# Patient Record
Sex: Female | Born: 1985 | Race: White | Hispanic: No | Marital: Single | Smoking: Current every day smoker
Health system: Southern US, Community
[De-identification: ages and names within clinical notes are randomized; demographics above are authoritative.]

## PROBLEM LIST (undated history)

## (undated) DIAGNOSIS — N809 Endometriosis, unspecified: Secondary | ICD-10-CM

## (undated) DIAGNOSIS — F419 Anxiety disorder, unspecified: Secondary | ICD-10-CM

## (undated) DIAGNOSIS — G935 Compression of brain: Secondary | ICD-10-CM

## (undated) DIAGNOSIS — J45909 Unspecified asthma, uncomplicated: Secondary | ICD-10-CM

## (undated) DIAGNOSIS — M24111 Other articular cartilage disorders, right shoulder: Secondary | ICD-10-CM

## (undated) DIAGNOSIS — S139XXA Sprain of joints and ligaments of unspecified parts of neck, initial encounter: Secondary | ICD-10-CM

## (undated) DIAGNOSIS — J189 Pneumonia, unspecified organism: Secondary | ICD-10-CM

## (undated) DIAGNOSIS — Z87442 Personal history of urinary calculi: Secondary | ICD-10-CM

## (undated) DIAGNOSIS — F32A Depression, unspecified: Secondary | ICD-10-CM

## (undated) DIAGNOSIS — F319 Bipolar disorder, unspecified: Secondary | ICD-10-CM

## (undated) DIAGNOSIS — R51 Headache: Secondary | ICD-10-CM

## (undated) DIAGNOSIS — S43006A Unspecified dislocation of unspecified shoulder joint, initial encounter: Secondary | ICD-10-CM

## (undated) DIAGNOSIS — F329 Major depressive disorder, single episode, unspecified: Secondary | ICD-10-CM

## (undated) DIAGNOSIS — Z8614 Personal history of Methicillin resistant Staphylococcus aureus infection: Secondary | ICD-10-CM

## (undated) DIAGNOSIS — G43711 Chronic migraine without aura, intractable, with status migrainosus: Secondary | ICD-10-CM

## (undated) DIAGNOSIS — Z8489 Family history of other specified conditions: Secondary | ICD-10-CM

## (undated) DIAGNOSIS — F4310 Post-traumatic stress disorder, unspecified: Secondary | ICD-10-CM

## (undated) DIAGNOSIS — K219 Gastro-esophageal reflux disease without esophagitis: Secondary | ICD-10-CM

## (undated) DIAGNOSIS — R519 Headache, unspecified: Secondary | ICD-10-CM

## (undated) DIAGNOSIS — F410 Panic disorder [episodic paroxysmal anxiety] without agoraphobia: Secondary | ICD-10-CM

## (undated) DIAGNOSIS — T4145XA Adverse effect of unspecified anesthetic, initial encounter: Secondary | ICD-10-CM

## (undated) DIAGNOSIS — J042 Acute laryngotracheitis: Secondary | ICD-10-CM

## (undated) DIAGNOSIS — T8859XA Other complications of anesthesia, initial encounter: Secondary | ICD-10-CM

## (undated) DIAGNOSIS — K3580 Unspecified acute appendicitis: Secondary | ICD-10-CM

## (undated) DIAGNOSIS — Z8669 Personal history of other diseases of the nervous system and sense organs: Secondary | ICD-10-CM

## (undated) DIAGNOSIS — M542 Cervicalgia: Secondary | ICD-10-CM

## (undated) HISTORY — PX: BRAIN SURGERY: SHX531

## (undated) HISTORY — DX: Personal history of other diseases of the nervous system and sense organs: Z86.69

## (undated) HISTORY — DX: Unspecified acute appendicitis: K35.80

---
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
## 2005-01-23 ENCOUNTER — Emergency Department (HOSPITAL_COMMUNITY): Admission: EM | Admit: 2005-01-23 | Discharge: 2005-01-23 | Payer: Self-pay | Admitting: Emergency Medicine

## 2005-07-29 ENCOUNTER — Ambulatory Visit (HOSPITAL_COMMUNITY): Admission: RE | Admit: 2005-07-29 | Discharge: 2005-07-29 | Payer: Self-pay | Admitting: Pediatrics

## 2006-02-19 ENCOUNTER — Emergency Department (HOSPITAL_COMMUNITY): Admission: EM | Admit: 2006-02-19 | Discharge: 2006-02-19 | Payer: Self-pay | Admitting: Emergency Medicine

## 2006-06-27 ENCOUNTER — Emergency Department (HOSPITAL_COMMUNITY): Admission: EM | Admit: 2006-06-27 | Discharge: 2006-06-27 | Payer: Self-pay | Admitting: Emergency Medicine

## 2006-07-30 ENCOUNTER — Emergency Department (HOSPITAL_COMMUNITY): Admission: EM | Admit: 2006-07-30 | Discharge: 2006-07-30 | Payer: Self-pay | Admitting: Emergency Medicine

## 2006-07-31 ENCOUNTER — Ambulatory Visit: Payer: Self-pay | Admitting: Orthopedic Surgery

## 2006-08-01 ENCOUNTER — Encounter: Payer: Self-pay | Admitting: Orthopedic Surgery

## 2006-08-04 ENCOUNTER — Emergency Department (HOSPITAL_COMMUNITY): Admission: EM | Admit: 2006-08-04 | Discharge: 2006-08-04 | Payer: Self-pay | Admitting: Emergency Medicine

## 2007-04-15 ENCOUNTER — Encounter (INDEPENDENT_AMBULATORY_CARE_PROVIDER_SITE_OTHER): Payer: Self-pay | Admitting: Obstetrics and Gynecology

## 2007-04-15 ENCOUNTER — Ambulatory Visit (HOSPITAL_COMMUNITY): Admission: RE | Admit: 2007-04-15 | Discharge: 2007-04-15 | Payer: Self-pay | Admitting: Obstetrics and Gynecology

## 2007-04-15 HISTORY — PX: DILATION AND EVACUATION: SHX1459

## 2007-09-08 ENCOUNTER — Ambulatory Visit: Payer: Self-pay | Admitting: Orthopedic Surgery

## 2007-09-08 DIAGNOSIS — S139XXA Sprain of joints and ligaments of unspecified parts of neck, initial encounter: Secondary | ICD-10-CM | POA: Insufficient documentation

## 2007-09-08 DIAGNOSIS — M542 Cervicalgia: Secondary | ICD-10-CM | POA: Insufficient documentation

## 2007-09-08 HISTORY — DX: Sprain of joints and ligaments of unspecified parts of neck, initial encounter: S13.9XXA

## 2007-09-09 ENCOUNTER — Encounter: Payer: Self-pay | Admitting: Orthopedic Surgery

## 2007-12-26 ENCOUNTER — Ambulatory Visit (HOSPITAL_COMMUNITY): Admission: RE | Admit: 2007-12-26 | Discharge: 2007-12-26 | Payer: Self-pay | Admitting: Emergency Medicine

## 2008-03-14 ENCOUNTER — Inpatient Hospital Stay (HOSPITAL_COMMUNITY): Admission: AD | Admit: 2008-03-14 | Discharge: 2008-03-14 | Payer: Self-pay | Admitting: Obstetrics and Gynecology

## 2008-08-21 ENCOUNTER — Inpatient Hospital Stay (HOSPITAL_COMMUNITY): Admission: AD | Admit: 2008-08-21 | Discharge: 2008-08-21 | Payer: Self-pay | Admitting: Obstetrics and Gynecology

## 2008-09-01 ENCOUNTER — Inpatient Hospital Stay (HOSPITAL_COMMUNITY): Admission: AD | Admit: 2008-09-01 | Discharge: 2008-09-01 | Payer: Self-pay | Admitting: Obstetrics and Gynecology

## 2008-09-05 ENCOUNTER — Inpatient Hospital Stay (HOSPITAL_COMMUNITY): Admission: RE | Admit: 2008-09-05 | Discharge: 2008-09-08 | Payer: Self-pay | Admitting: Obstetrics and Gynecology

## 2008-09-05 ENCOUNTER — Encounter (INDEPENDENT_AMBULATORY_CARE_PROVIDER_SITE_OTHER): Payer: Self-pay | Admitting: Obstetrics and Gynecology

## 2010-07-22 ENCOUNTER — Encounter: Payer: Self-pay | Admitting: Emergency Medicine

## 2010-07-23 ENCOUNTER — Encounter: Payer: Self-pay | Admitting: Emergency Medicine

## 2010-07-31 NOTE — Assessment & Plan Note (Signed)
Summary: neck pain/no [email or web address].cbt    History of Present Illness: I saw [name] in the office today for a followup visit.  She is a 25 years old woman with the complaint of:  NEW PROBLEM. Neck pain, no xrays.  She has been having non radiating pain for a month, no injury.  She has constant pain, that is sometimes worse than others.  She had a cold several weeks ago, denies fevr chills or muscle aches.  Pain now is 4. If she tilts her head down far or tilts it back far she notices alot of pain.  She has tried skelaxin, ibuprofen 800mg , valium and this does not help.  She has pain between shoulder blades.      Current Allergies: No known allergies      Review of Systems  GU      miscarriage Oct 08-needed surgery   Psych      depression treated w/o medication    Physical Exam  GEN: well developed and nourished. normal body habitus, grooming and hygiene CDV: normal pulses perfusion color and temperature without swelling or edema Lymph: normal lymph system SKIN: normal without lesions, masses, nodules NEURO/PSYCH: Normal coordination, reflexes and sensation. awake alert and oriented  MSK:C spine tenderness at C5-6, C4-5  Inspection  ROM   MOTOR 5/5  Stability: normal      Impression & Recommendations:  Problem # 1:  CERVICAL SPASM (ICD-847.0) x-rays show a normal C spine to the level of T1   Findings: normal contour and disc spaces  IMP: normal cspine    Orders: T-CBC w/Diff ([id number]) T-Sed Rate (Automated) ([id number]) T- * Misc. Laboratory test [phone]) Est. Patient Level III (84132)   Problem # 2:  CERVICALGIA (ICD-723.1)  Orders: T- * Misc. Laboratory test [phone]) Est. Patient Level III (27253)   Other Orders: Cervical x-ray 2/3 views ([id number])   Patient Instructions: 1)  Lab studies will be done to check for diskitis. I will call you with the results. 2)  Start robaxin and prednisone for neck pain.    ]

## 2010-07-31 NOTE — Progress Notes (Signed)
Summary: Office Visit  Office Visit   Imported By: Jacklynn Ganong 09/08/2007 10:29:07  _____________________________________________________________________  External Attachment:    Type:   Image     Comment:   initial evaluation

## 2010-08-13 ENCOUNTER — Emergency Department (HOSPITAL_COMMUNITY): Payer: 59

## 2010-08-13 ENCOUNTER — Emergency Department (HOSPITAL_COMMUNITY)
Admission: EM | Admit: 2010-08-13 | Discharge: 2010-08-13 | Disposition: A | Discharge status: Home / Self Care | Payer: 59 | Attending: Emergency Medicine | Admitting: Emergency Medicine

## 2010-08-13 DIAGNOSIS — R05 Cough: Secondary | ICD-10-CM | POA: Insufficient documentation

## 2010-08-13 DIAGNOSIS — J209 Acute bronchitis, unspecified: Secondary | ICD-10-CM | POA: Insufficient documentation

## 2010-08-13 DIAGNOSIS — R059 Cough, unspecified: Secondary | ICD-10-CM | POA: Insufficient documentation

## 2010-09-10 ENCOUNTER — Ambulatory Visit (HOSPITAL_COMMUNITY): Payer: 59 | Admitting: Psychiatry

## 2010-10-11 LAB — CBC
HCT: 30.3 % — ABNORMAL LOW (ref 36.0–46.0)
HCT: 37.3 % (ref 36.0–46.0)
Hemoglobin: 10.4 g/dL — ABNORMAL LOW (ref 12.0–15.0)
Hemoglobin: 12.8 g/dL (ref 12.0–15.0)
MCHC: 34.2 g/dL (ref 30.0–36.0)
MCHC: 34.3 g/dL (ref 30.0–36.0)
MCV: 92.6 fL (ref 78.0–100.0)
MCV: 92.9 fL (ref 78.0–100.0)
Platelets: 151 10*3/uL (ref 150–400)
Platelets: 187 10*3/uL (ref 150–400)
RBC: 3.27 MIL/uL — ABNORMAL LOW (ref 3.87–5.11)
RBC: 4.03 MIL/uL (ref 3.87–5.11)
RDW: 12.7 % (ref 11.5–15.5)
RDW: 13 % (ref 11.5–15.5)
WBC: 7.4 10*3/uL (ref 4.0–10.5)
WBC: 8.2 10*3/uL (ref 4.0–10.5)

## 2010-10-11 LAB — RPR: RPR Ser Ql: NONREACTIVE

## 2010-11-13 NOTE — Op Note (Signed)
[name], [name]             ACCOUNT NO.:  [id number]   MEDICAL RECORD NO.:  [id number]          PATIENT TYPE:  INP   LOCATION:  9121                          FACILITY:  [facility]   PHYSICIAN:  [name]. [name], [name] DATE OF BIRTH:  07-Nov-1985   DATE OF PROCEDURE:  09/05/2008  DATE OF DISCHARGE:                               OPERATIVE REPORT   PREOPERATIVE DIAGNOSES:  1. Intrauterine pregnancy at 39-1/2 weeks' estimated gestational age.  2. Recurrent vulvar herpes simplex.   POSTOPERATIVE DIAGNOSES:  1. Intrauterine pregnancy at 39-1/2 weeks' estimated gestational age.  2. Recurrent vulvar herpes simplex.   PROCEDURE:  Low-transverse cesarean section.   SURGEON:  [name]. [name], MD   ANESTHESIA:  Spinal, Leilani Able, MD   SPECIMENS:  Placenta to pathology.   ESTIMATED BLOOD LOSS:  800 mL.   FINDINGS:  Viable female infant, Apgars of 9 and 9 at one and five minutes  respectively.  Arterial cord pH 7.28.  Birth weight pending.   INDICATIONS AND CONSENT:  This patient is a 25 year old married female  G2, P0, EDC of September 10, 2008, consistent with early ultrasound.  Past  recurrent vulvar HSV.  Details dictated in the history and physical.  Cesarean section discussed with the patient.  Potential risks and  complications were discussed preoperatively including but not limited to  infection, organ damage, bleeding requiring transfusion of blood  products with HIV and hepatitis acquisition, DVT, PE, and pneumonia.  All questions were answered and consents on the chart.   PROCEDURE:  The patient was taken to operating room where she was  identified.  Spinal anesthetic was placed and she was placed in dorsal  supine position with a 15-degree left lateral wedge.  She was then  prepped.  Foley catheter was placed and bladder was drained.  She was  draped in a sterile fashion.  Time out was done.  After testing for  adequate spinal anesthesia, skin was entered through a  Pfannenstiel  incision and dissection was carried out in layers to the peritoneum.  Peritoneum was incised and extended superiorly and inferiorly.  Vesicouterine peritoneum was taken down cephalad laterally.  Bladder  flap was developed.  The bladder blade was placed.  Uterus was incised  in a low transverse manner.  The uterine cavity was entered bluntly with  a hemostat.  Uterine incision was extended cephalad laterally with  fingers.  Artificial rupture of membranes for clear fluid was carried  out.  The vertex was elevated in the incision with a vacuum extractor  without difficulty.  A single nuchal cord was reduced.  The baby was  then delivered and oronasal pharynx were suctioned.  Good cry and tone  was noted.  Cord was clamped and cut and the baby was handed to the  awaiting pediatrics team.  Placenta was manually delivered and sent to  pathology.  Uterine cavity was clean.  Uterus was closed in a running  locking fashion with two layers of imbricating 0 Monocryl suture.  The  bladder flap was also reapproximated.  Good hemostasis was noted.  Tubes  and ovaries were  normal bilaterally.  Anterior peritoneum was closed in  running fashion with 0 Monocryl suture, which was also used to  reapproximate the pyramidalis muscle in the midline.  Anterior rectus  fascia was closed in running fashion with the looped 0 PDS suture  starting each angle and meeting in the middle.  Skin was closed with  clips.  All sponge and instrument counts were correct and the patient  was transferred to the recovery room in stable condition.      [name] [name], [name]  Electronically Signed     JET/MEDQ  D:  09/05/2008  T:  09/06/2008  Job:  [id number]

## 2010-11-13 NOTE — H&P (Signed)
[name], [name]             ACCOUNT NO.:  [id number]   MEDICAL RECORD NO.:  [id number]          PATIENT TYPE:  AMB   LOCATION:  SDC                           FACILITY:  [facility]   PHYSICIAN:  [name]. [name], [name] DATE OF BIRTH:  Jul 18, 1985   DATE OF ADMISSION:  04/15/2007  DATE OF DISCHARGE:                              HISTORY & PHYSICAL   CHIEF COMPLAINT:  Miscarriage.   HISTORY OF PRESENT ILLNESS:  This patient is a 25 year old single white  female, G1 P0, with a last menstrual period of February 23, 2007.  On  ultrasound, she has had an intrauterine gestational sac with a yolk sac  and no fetal pole, with essentially no progression of development over  the last 13 days and 3 ultrasounds.  Quantitative HCGs have been slowly  rising.  However, with lack of development of the fetal pole, it is felt  to be consistent with a spontaneous abortion.  After discussion of  options, she is being admitted for dilatation and evacuation.  Potential  risks and complications have been reviewed preoperatively.  Blood type  is O positive.   PAST MEDICAL HISTORY:  1. History of STD.  2. History of yeast infections.   PAST SURGICAL HISTORY:  Negative.   FAMILY HISTORY:  Positive for diabetes, cancer, high blood pressure,  stroke, rheumatic fever, lung disease, varicose veins.   SOCIAL HISTORY:  Positive for social tobacco and alcohol use.  Denies  drug abuse.   MEDICATIONS:  Vitamins.   No known drug allergies.   REVIEW OF SYSTEMS:  NEUROLOGIC:  Denies headache.  CARDIAC:  No chest  pain.  PULMONARY:  Denies shortness of breath.  GI:  Denies recent  changes in bowel habits.   PHYSICAL EXAMINATION:  LUNGS:  Clear to auscultation.  HEART:  Regular rate and rhythm.  BREASTS:  Not examined.  ABDOMEN:  Nontender.  PELVIC:  Deferred.  EXTREMITIES:  Grossly within normal limits.  NEUROLOGIC:  Grossly within normal limits.   ASSESSMENT:  Spontaneous abortion.   PLAN:  Dilatation and  evacuation.      [name] [name], [name]  Electronically Signed     JET/MEDQ  D:  04/14/2007  T:  04/15/2007  Job:  [id number]

## 2010-11-13 NOTE — Discharge Summary (Signed)
NAMESACHE, SANE NO.:  [id number]   MEDICAL RECORD NO.:  [id number]          PATIENT TYPE:  MAT   LOCATION:  MATC                          FACILITY:  [facility]   PHYSICIAN:  Freddy Finner, [name]   DATE OF BIRTH:  February 28, 1986   DATE OF ADMISSION:  09/01/2008  DATE OF DISCHARGE:  09/01/2008                               DISCHARGE SUMMARY   ADMITTING DIAGNOSES:  1. Uterine pregnancy at 39-1/2 weeks estimated gestational age.  2. Recurrent vulvar herpes simplex.   DISCHARGE DIAGNOSES:  1. Status post low transverse cesarean section.  2. A viable female infant.   PROCEDURE:  Primary low transverse cesarean section.   REASON FOR ADMISSION:  Please see written H and P.   HOSPITAL COURSE:  The patient is a 25 year old white married female  gravida 2, para 0 that was admitted to [facility] at 30-  1/2 weeks estimated gestational age for cesarean section.  The patient  did have recurrent vulvar herpes simplex and decision was made to  proceed with a primary low transverse cesarean delivery.  The patient  was then transferred to the operating room where spinal anesthesia was  administered without difficulty.  A low transverse incision was made  with delivery of a viable female infant with Apgars of that at 9 at 1  minute and 9 at 5 minutes.  Arterial cord pH of 7.28.  The patient  tolerated the procedure well and was taken to the recovery room in  stable condition.   On postoperative day #1, the patient was without complaints.  Vital  signs are stable.  She is afebrile.  Abdomen soft with good return of  bowel function.  Abdominal dressing was noted to have a scant amount of  drainage noted on the bandage.   LABORATORY FINDINGS:  Hemoglobin of 10.4 platelet count of 151,000, and  WBC count of 7.4.  Blood type was noted to be O+.   On postoperative day #2, the patient was without complaint.  Vital signs  remained stable.  She is afebrile.  Abdominal  dressing had been removed  revealing incision with clean, dry, and intact.   On postoperative day #3, the patient was without complaint.  Vital signs  remained stable.  She is afebrile.  Fundus firm and nontender.  Incision  was clean, dry, and intact.  Small ecchymosis noted inferior to the  incisional site.  Staples were removed and the patient was later  discharged home.   CONDITION ON DISCHARGE:  Stable.   DIET:  Regular as tolerated.   ACTIVITY:  No heavy lifting or driving x2 weeks, no vaginal entry.   FOLLOWUP:  The patient to follow up in the office in 1-2 weeks for an  incision check.  She is to call for temperature greater than 100  degrees, persistent nausea, vomiting, heavy vaginal bleeding, and/or  redness or drainage from the incisional site.   DISCHARGE MEDICATIONS:  1. Tylox #30 one p.o. every 4-6 hours.  2. Motrin 600 mg every 6 hours.  3. Prenatal vitamins 1 p.o. daily.  4. Colace 1 p.o.  daily p.r.n.      Julio Sicks, N.P.      Freddy Finner, [name]  Electronically Signed    CC/MEDQ  D:  09/08/2008  T:  09/08/2008  Job:  [id number]

## 2010-11-13 NOTE — Op Note (Signed)
NAMEMELIANA, CANNER             ACCOUNT NO.:  [id number]   MEDICAL RECORD NO.:  [id number]          PATIENT TYPE:  AMB   LOCATION:  SDC                           [facility]ACILITY:  [facility]   PHYSICIAN:  [name]. [name], [name] DATE OF BIRTH:  07-Sep-1985   DATE OF PROCEDURE:  04/15/2007  DATE OF DISCHARGE:                               OPERATIVE REPORT   PREOPERATIVE DIAGNOSIS:  Spontaneous abortion.   POSTOPERATIVE DIAGNOSIS:  Spontaneous abortion.   PROCEDURE:  1. Dilatation and evacuation.  2. 1% Xylocaine paracervical block   SURGEON:  [name]. [name], [name]   ANESTHESIA:  MAC.   SPECIMENS:  Products of conception to pathology.   ESTIMATED BLOOD LOSS:  Minimal.   INDICATIONS AND CONSENT:  The patient is a 25 year old single white  female G1, P0, with spontaneous miscarriage.  Details are dictated in  the history and physical.  Dilatation and evacuation has been discussed  with the patient preoperatively.  The potential risks and complications  have been reviewed including but limited to infection, uterine  perforation, organ damage, bleeding requiring transfusion of blood  products, intrauterine synechia, secondary infertility, DVT, PE,  pneumonia.  All questions were answered and consent is signed on the  chart.   DESCRIPTION OF PROCEDURE:  The patient was taken to the operating room  where she is identified, placed in the dorsal supine position, and given  intravenous sedation.  She is then placed in the dorsal lithotomy  position where she is prepped, bladder straight catheterized, and draped  in a sterile fashion.  Examination revealed the uterus to be 6-7 weeks  in size.  Bivalve speculum was placed in the vagina.  The anterior  cervical lip was injected with 1% plain Xylocaine and grasped with a  single tooth tenaculum.  A paracervical block was placed at the 2, 4, 5,  7, 8, and 10 o'clock positions with approximately 20 mL total of 1%  plain Xylocaine.  The cervix is  gently and progressively dilated to a 27  dilator.  A #7 curved curette is placed in the uterine cavity and  suction curettage is carried out for obvious products of conception.  After the initial pass of the suction curette, 20 units Pitocin were  added to 1 liter of  IV fluids.  Alternating sharp and suction curettage is carried out until  the cavity feels clean.  Good hemostasis is noted.  The patient is given  intravenous Ancef.  Blood type is O+.  She is discharged home on  Methergine 0.2 mg t.i.d. for six doses.  Follow up is in the office in  two weeks.      [name] [name], [name]  Electronically Signed     JET/MEDQ  D:  04/15/2007  T:  04/15/2007  Job:  [id number]

## 2010-11-13 NOTE — H&P (Signed)
[name], [name] NO.:  [id number]   MEDICAL RECORD NO.:  [id number]           PATIENT TYPE:   LOCATION:                                 FACILITY:   PHYSICIAN:  [name]. [name], [name] DATE OF BIRTH:  [birth year]   DATE OF ADMISSION:  DATE OF DISCHARGE:                              HISTORY & PHYSICAL   CHIEF COMPLAINT:  Recurrent vulvar herpes.   HISTORY OF PRESENT ILLNESS:  This patient is a 25 year old married white  female, G2, P0 with an EDC of September 10, 2008, consistent with early  ultrasound placing her at 39-1/2 weeks' estimated gestational age.  Prenatal care has been complicated by a two-vessel cord.  Consistent  growth has been noted on ultrasounds and nonstress tests have been  reactive.  She has had depression and anxiety.  Currently on Zoloft 50  mg daily.  She has a history of culture-positive herpes as a 15-year-  old.  She has a recurrent vulvar lesion.  After discussion of the  options, she is being admitted for cesarean section.  Potential risks  and complications have been discussed preoperatively.   PAST MEDICAL HISTORY:  See prenatal history and physical.   PAST SURGICAL HISTORY:  See prenatal history and physical.   FAMILY HISTORY:  See prenatal history and physical.   OBSTETRICAL HISTORY:  See prenatal history and physical.   SOCIAL HISTORY:  See prenatal history and physical.   MEDICATIONS:  1. Prenatal vitamins.  2. Zoloft 50 mg daily.  3. Valtrex 500 mg 1 p.o. daily.   ALLERGIES:  No known drug allergies.   REVIEW OF SYSTEMS:  NEURO:  Denies headache.  CARDIAC:  Denies chest  pain.  PULMONARY:  Denies shortness of breath.   PHYSICAL EXAMINATION:  VITAL SIGNS:  Height 5 feet 6 inches and weight  220.6 pounds.  LUNGS:  Clear to auscultation.  HEART:  Regular rate and rhythm.  ABDOMEN:  Gravid and nontender.  PELVIC:  Cervix is closed and 30% effaced.  EXTREMITIES:  Grossly within normal limits.  NEUROLOGIC:  Grossly  within normal limits.   ASSESSMENT:  1. Intrauterine pregnancy at 39-1/2 weeks' estimated gestational age.  2. Recurrent vulvar herpes.   PLAN:  Cesarean section.      [name] [name], [name]  Electronically Signed     JET/MEDQ  D:  09/02/2008  T:  09/03/2008  Job:  [id number]

## 2010-11-16 NOTE — Discharge Summary (Signed)
NAMEHARLIE, BUENING NO.:  [id number]   MEDICAL RECORD NO.:  [id number]          PATIENT TYPE:  INP   LOCATION:  9121                          [facility]ACILITY:  [facility]   PHYSICIAN:  Freddy Finner, [name]   DATE OF BIRTH:  [birth year]   DATE OF ADMISSION:  09/05/2008  DATE OF DISCHARGE:  09/08/2008                               DISCHARGE SUMMARY   ADMITTING DIAGNOSES:  1. Intrauterine pregnancy at 39-1/2 weeks estimated gestational age.  2. Recurrent vulvar herpes simplex.   DISCHARGE DIAGNOSES:  1. Status post low transverse cesarean section.  2. Viable female infant.   PROCEDURE:  Low transverse cesarean section.   REASON FOR ADMISSION:  Please see dictated H&P.   HOSPITAL COURSE:  The patient is a 25 year old married female gravida 2,  para 0, that was admitted to [facility] at 39-1/2 weeks  estimated gestational age for cesarean section.  The patient did have  past recurrent vulvar HSV.  Decision was made to proceed with a primary  low transverse cesarean section.  The patient was then taken to the  operating room where spinal anesthesia was administered without  difficulty.  A low transverse incision was made with delivery of a  viable female infant with Apgars of 9 at 1 minute and 9 at 5 minutes.  Arterial cord pH of 7.28.  The patient tolerated the procedure well and  was taken to the recovery room in stable condition.  On postoperative  day #1, the patient was without complaint.  Vital signs remained stable.  She was afebrile.  Abdomen was soft.  Fundus firm and nontender.  Abdominal dressing noted to have a scant amount of drainage noted on the  bandage.   LABORATORY FINDINGS:  Hemoglobin of 10.4, platelet count of 151,000, WBC  count of 7.4, and blood type was noted to be O positive.   On postoperative day #2, the patient was without complaint.  Vital signs  remained stable.  She was afebrile.  Fundus firm and nontender.  Incision was clean,  dry, and intact.   On postoperative day #3, the patient was without complaint.  Vital signs  remained stable.  She was afebrile.  Fundus firm and nontender.  Incision was clean, dry, and intact.  Staples removed and the patient  was later discharged to home.   CONDITION ON DISCHARGE:  Stable.   DIET:  Regular as tolerated.   ACTIVITY:  No heavy lifting, no driving x2 weeks, no vaginal entry.   FOLLOWUP:  The patient is to follow up in the office in 1-2 weeks for an  incision check.  She is to call for temperature greater than 100  degrees, persistent nausea, vomiting, heavy vaginal bleeding and/or  redness or drainage from the incisional site.   DISCHARGE MEDICATIONS:  1. Tylox #30 one p.o. every 4-6 hours p.r.n.  2. Motrin 600 mg every 6 hours.  3. Prenatal vitamins 1 p.o. daily.  4. Colace 1 p.o. daily p.r.n.      Julio Sicks, N.P.      Freddy Finner, [name]  Electronically Signed  CC/MEDQ  D:  09/28/2008  T:  09/28/2008  Job:  [id number]

## 2011-03-30 ENCOUNTER — Emergency Department (HOSPITAL_COMMUNITY): Payer: 59

## 2011-03-30 ENCOUNTER — Encounter: Payer: Self-pay | Admitting: *Deleted

## 2011-03-30 ENCOUNTER — Emergency Department (HOSPITAL_COMMUNITY)
Admission: EM | Admit: 2011-03-30 | Discharge: 2011-03-30 | Disposition: A | Discharge status: Home / Self Care | Payer: 59 | Attending: Emergency Medicine | Admitting: Emergency Medicine

## 2011-03-30 DIAGNOSIS — R110 Nausea: Secondary | ICD-10-CM | POA: Insufficient documentation

## 2011-03-30 DIAGNOSIS — N760 Acute vaginitis: Secondary | ICD-10-CM | POA: Insufficient documentation

## 2011-03-30 DIAGNOSIS — F17200 Nicotine dependence, unspecified, uncomplicated: Secondary | ICD-10-CM | POA: Insufficient documentation

## 2011-03-30 DIAGNOSIS — B9689 Other specified bacterial agents as the cause of diseases classified elsewhere: Secondary | ICD-10-CM | POA: Insufficient documentation

## 2011-03-30 DIAGNOSIS — R1011 Right upper quadrant pain: Secondary | ICD-10-CM | POA: Insufficient documentation

## 2011-03-30 DIAGNOSIS — R1031 Right lower quadrant pain: Secondary | ICD-10-CM

## 2011-03-30 DIAGNOSIS — A499 Bacterial infection, unspecified: Secondary | ICD-10-CM | POA: Insufficient documentation

## 2011-03-30 LAB — DIFFERENTIAL
Basophils Absolute: 0 10*3/uL (ref 0.0–0.1)
Basophils Relative: 0 % (ref 0–1)
Eosinophils Absolute: 0.1 10*3/uL (ref 0.0–0.7)
Eosinophils Relative: 2 % (ref 0–5)
Lymphocytes Relative: 26 % (ref 12–46)
Lymphs Abs: 1.8 10*3/uL (ref 0.7–4.0)
Monocytes Absolute: 0.3 10*3/uL (ref 0.1–1.0)
Monocytes Relative: 5 % (ref 3–12)
Neutro Abs: 4.5 10*3/uL (ref 1.7–7.7)
Neutrophils Relative %: 67 % (ref 43–77)

## 2011-03-30 LAB — CBC
HCT: 39.9 % (ref 36.0–46.0)
Hemoglobin: 13.6 g/dL (ref 12.0–15.0)
MCH: 32.1 pg (ref 26.0–34.0)
MCHC: 34.1 g/dL (ref 30.0–36.0)
MCV: 94.1 fL (ref 78.0–100.0)
Platelets: 215 10*3/uL (ref 150–400)
RBC: 4.24 MIL/uL (ref 3.87–5.11)
RDW: 12.6 % (ref 11.5–15.5)
WBC: 6.8 10*3/uL (ref 4.0–10.5)

## 2011-03-30 LAB — WET PREP, GENITAL
Trich, Wet Prep: NONE SEEN
Yeast Wet Prep HPF POC: NONE SEEN

## 2011-03-30 LAB — BASIC METABOLIC PANEL
BUN: 8 mg/dL (ref 6–23)
CO2: 28 mEq/L (ref 19–32)
Calcium: 9.4 mg/dL (ref 8.4–10.5)
Chloride: 104 mEq/L (ref 96–112)
Creatinine, Ser: 0.92 mg/dL (ref 0.50–1.10)
GFR calc Af Amer: >60 mL/min (ref >60)
GFR calc non Af Amer: >60 mL/min (ref >60)
Glucose, Bld: 86 mg/dL (ref 70–99)
Potassium: 3.3 mEq/L — ABNORMAL LOW (ref 3.5–5.1)
Sodium: 139 mEq/L (ref 135–145)

## 2011-03-30 LAB — URINE MICROSCOPIC-ADD ON

## 2011-03-30 LAB — URINALYSIS, ROUTINE W REFLEX MICROSCOPIC
Bilirubin Urine: NEGATIVE
Glucose, UA: NEGATIVE mg/dL
Leukocytes, UA: NEGATIVE
Nitrite: NEGATIVE
Protein, ur: NEGATIVE mg/dL
Specific Gravity, Urine: >1.03 — ABNORMAL HIGH (ref 1.005–1.030)
Urobilinogen, UA: 0.2 mg/dL (ref 0.0–1.0)
pH: 5.5 (ref 5.0–8.0)

## 2011-03-30 LAB — PREGNANCY, URINE: Preg Test, Ur: NEGATIVE

## 2011-03-30 MED ORDER — HYDROMORPHONE HCL 1 MG/ML IJ SOLN
INTRAMUSCULAR | Status: AC
  Administered 2011-03-30: 1 mg via INTRAVENOUS
  Filled 2011-03-30: qty 1

## 2011-03-30 MED ORDER — ONDANSETRON HCL 4 MG/2ML IJ SOLN
INTRAMUSCULAR | Status: AC
  Administered 2011-03-30: 4 mg via INTRAVENOUS
  Filled 2011-03-30: qty 2

## 2011-03-30 MED ORDER — HYDROMORPHONE HCL 1 MG/ML IJ SOLN
1.0000 mg | Freq: Once | INTRAMUSCULAR | Status: AC
  Administered 2011-03-30: 1 mg via INTRAVENOUS

## 2011-03-30 MED ORDER — OXYCODONE-ACETAMINOPHEN 5-325 MG PO TABS: 1.0000 | ORAL_TABLET | ORAL | Status: AC | PRN

## 2011-03-30 MED ORDER — SODIUM CHLORIDE 0.9 % IV BOLUS (SEPSIS)
1000.0000 mL | Freq: Once | INTRAVENOUS | Status: AC
  Administered 2011-03-30: 1000 mL via INTRAVENOUS

## 2011-03-30 MED ORDER — ONDANSETRON HCL 4 MG/2ML IJ SOLN
4.0000 mg | Freq: Once | INTRAMUSCULAR | Status: AC
  Administered 2011-03-30: 4 mg via INTRAVENOUS
  Filled 2011-03-30: qty 2

## 2011-03-30 MED ORDER — METRONIDAZOLE 500 MG PO TABS: 500.0000 mg | ORAL_TABLET | Freq: Two times a day (BID) | ORAL | Status: AC

## 2011-03-30 MED ORDER — ONDANSETRON HCL 4 MG/2ML IJ SOLN
4.0000 mg | Freq: Once | INTRAMUSCULAR | Status: AC
  Administered 2011-03-30: 4 mg via INTRAVENOUS

## 2011-03-30 NOTE — ED Provider Notes (Signed)
History     CSN: [id number] Arrival date & time: 03/30/2011  3:10 PM  Chief Complaint  Patient presents with  . Abdominal Pain    HPI Comments: Pt p/w 3-4 hr h/o RLQ/side pain, it is deep pain/soreness w/ intermittent sharpness. Sharp pain is 10/10, soreness is 8/10.  Pain initially started yesterday evening but acutely worsened after eating lunch and walking around this afternoon.  Has been having nausea w/ eating for the past week. No vomiting. No diarrhea. No fevers/chills.  Hip/side/RLQ are sore to touch. Pt thought it looked swollen last night but that has resolved.   Patient is a 25 y.o. female presenting with abdominal pain. The history is provided by the patient. No language interpreter was used.  Abdominal Pain The primary symptoms of the illness include abdominal pain and nausea. The primary symptoms of the illness do not include fever, shortness of breath, vomiting, diarrhea, hematemesis, dysuria, vaginal discharge or vaginal bleeding. The current episode started 3 to 5 hours ago. The onset of the illness was sudden. The problem has not changed since onset. The abdominal pain began 3 to 5 hours ago. The pain came on suddenly. The abdominal pain has been unchanged since its onset. The abdominal pain is located in the RUQ. The abdominal pain does not radiate. The severity of the abdominal pain is 10/10. The abdominal pain is relieved by nothing. The abdominal pain is exacerbated by certain positions.  Nausea began 6 to 7 days ago. The nausea is associated with eating. The nausea is exacerbated by food.  The patient states that she believes she is currently not pregnant. The patient has not had a change in bowel habit. Symptoms associated with the illness do not include chills, anorexia, diaphoresis, heartburn, constipation, urgency, hematuria, frequency or back pain. Significant associated medical issues do not include GERD, inflammatory bowel disease, diabetes or substance abuse.     History reviewed. No pertinent past medical history.  Past Surgical History  Procedure Date  . Cesarean section     History reviewed. No pertinent family history.  History  Substance Use Topics  . Smoking status: Current Everyday Smoker -- 1.0 packs/day    Types: Cigarettes  . Smokeless tobacco: Not on file  . Alcohol Use: Yes     occasionally    OB History    Grav Para Term Preterm Abortions TAB SAB Ect Mult Living                  Review of Systems  Constitutional: Negative for fever, chills and diaphoresis.  HENT: Negative for congestion, sore throat, rhinorrhea and neck pain.   Eyes: Negative for visual disturbance.  Respiratory: Negative for cough, chest tightness and shortness of breath.   Cardiovascular: Negative for chest pain and leg swelling.  Gastrointestinal: Positive for nausea and abdominal pain. Negative for heartburn, vomiting, diarrhea, constipation, anorexia and hematemesis.  Genitourinary: Negative for dysuria, urgency, frequency, hematuria, flank pain, vaginal bleeding, vaginal discharge, difficulty urinating, vaginal pain, menstrual problem and pelvic pain.  Musculoskeletal: Negative for back pain.  Skin: Negative for rash.  Neurological: Negative for dizziness, light-headedness, numbness and headaches.  Hematological: Negative for adenopathy.    Allergies  Review of patient's allergies indicates no known allergies.  Home Medications  No current outpatient prescriptions on file.  BP 118/76  Pulse 100  Temp(Src) 98.3 F (36.8 C) (Oral)  Resp 18  Ht 5\' 6"  (1.676 m)  Wt 185 lb (83.915 kg)  BMI 29.86 kg/m2  SpO2 100%  LMP 03/24/2011  Physical Exam  Constitutional: She is oriented to person, place, and time. She appears well-developed and well-nourished. No distress.  HENT:  Head: Normocephalic and atraumatic.  Eyes: Conjunctivae and EOM are normal.  Neck: Normal range of motion. Neck supple.  Cardiovascular: Normal rate, regular  rhythm and normal heart sounds.   No murmur heard. Pulmonary/Chest: Effort normal and breath sounds normal. No respiratory distress. She has no wheezes.  Abdominal: Soft. Bowel sounds are normal. She exhibits no distension. There is tenderness in the right lower quadrant. There is no rebound and no CVA tenderness.  Genitourinary: Vagina normal. There is no rash on the right labia. There is no rash on the left labia. Uterus is not tender. Cervix exhibits no motion tenderness, no discharge and no friability. Right adnexum displays no mass, no tenderness and no fullness. Left adnexum displays no mass, no tenderness and no fullness. No tenderness or bleeding around the vagina. No vaginal discharge found.       Ectopy at cervix, no obvious carcinoma.   Musculoskeletal: Normal range of motion. She exhibits no edema.  Lymphadenopathy:    She has no cervical adenopathy.  Neurological: She is alert and oriented to person, place, and time.  Skin: Skin is warm and dry. No rash noted.    ED Course  Procedures (including critical care time)  Labs Reviewed - No data to display No results found.   No diagnosis found.    MDM  Ddx includes: appendicitis vs ectopic pregnancy vs ovarian cyst vs ovarian torsion vs MSK vs ureteral stone  Pelvic exam unremarkable, pt signicifantly less tender s/p dilaudid and zofran. Pt does have area of ectopy at cervical os, encouraged pt to get pap smear since it has been >3-4 years.   CT abd/pelvis showing non-obstructive nephrolithiasis on LEFT.  Pt does have BV on wet prep.  Could have PID component vs ovarian cyst vs other.  No worrisome findings on CT scan. Will treat BV and f/u GC/Chlam cultures. If positive will treat STI and treat pt for PID.       Harvel Quale Derryl Uher Resident 03/30/11 [phone]

## 2011-03-30 NOTE — ED Notes (Signed)
Pt a/ox4. Resp even and unlabored. NAD at this time. D/C instructions and Rx x 2  reviewed with pt. Pt verbalized understanding. Pt ambulated with steady gate to POV. Mother with pt to transport home.

## 2011-03-30 NOTE — ED Notes (Signed)
Pt c/o pain in her RLQ abdomen since yesterday. Pt also c/o nausea x 1 week. Pt denies vomiting or diarrhea.

## 2011-03-31 NOTE — ED Provider Notes (Signed)
Medical screening examination/treatment/procedure(s) were performed by resident and as supervising physician I was immediately available for consultation/collaboration.  Nelia Shi, MD 03/31/11 [phone]

## 2011-04-01 LAB — GC/CHLAMYDIA PROBE AMP, GENITAL
Chlamydia, DNA Probe: NEGATIVE
GC Probe Amp, Genital: NEGATIVE

## 2011-04-03 LAB — URINE MICROSCOPIC-ADD ON: RBC / HPF: NONE SEEN

## 2011-04-03 LAB — URINE CULTURE: Colony Count: 30000

## 2011-04-03 LAB — URINALYSIS, ROUTINE W REFLEX MICROSCOPIC
Bilirubin Urine: NEGATIVE
Glucose, UA: NEGATIVE
Hgb urine dipstick: NEGATIVE
Ketones, ur: NEGATIVE
Nitrite: NEGATIVE
Protein, ur: NEGATIVE
Specific Gravity, Urine: 1.025
Urobilinogen, UA: 0.2
pH: 5.5

## 2011-04-03 LAB — WET PREP, GENITAL
Clue Cells Wet Prep HPF POC: NONE SEEN
Trich, Wet Prep: NONE SEEN
Yeast Wet Prep HPF POC: NONE SEEN

## 2011-04-10 LAB — CBC
HCT: 37.7
Hemoglobin: 13.1
MCHC: 34.8
MCV: 93.5
Platelets: 224
RBC: 4.03
RDW: 12.7
WBC: 5.5

## 2011-10-04 ENCOUNTER — Inpatient Hospital Stay (HOSPITAL_COMMUNITY)
Admission: AD | Admit: 2011-10-04 | Discharge: 2011-10-04 | Disposition: A | Discharge status: Home / Self Care | Payer: BC Managed Care – PPO | Source: Ambulatory Visit | Attending: Obstetrics and Gynecology | Admitting: Obstetrics and Gynecology

## 2011-10-04 ENCOUNTER — Encounter (HOSPITAL_COMMUNITY): Payer: Self-pay

## 2011-10-04 ENCOUNTER — Inpatient Hospital Stay (HOSPITAL_COMMUNITY): Payer: BC Managed Care – PPO

## 2011-10-04 DIAGNOSIS — B9689 Other specified bacterial agents as the cause of diseases classified elsewhere: Secondary | ICD-10-CM | POA: Insufficient documentation

## 2011-10-04 DIAGNOSIS — A499 Bacterial infection, unspecified: Secondary | ICD-10-CM | POA: Insufficient documentation

## 2011-10-04 DIAGNOSIS — N760 Acute vaginitis: Secondary | ICD-10-CM | POA: Insufficient documentation

## 2011-10-04 DIAGNOSIS — N83209 Unspecified ovarian cyst, unspecified side: Secondary | ICD-10-CM | POA: Insufficient documentation

## 2011-10-04 DIAGNOSIS — N739 Female pelvic inflammatory disease, unspecified: Secondary | ICD-10-CM | POA: Insufficient documentation

## 2011-10-04 DIAGNOSIS — R1032 Left lower quadrant pain: Secondary | ICD-10-CM | POA: Insufficient documentation

## 2011-10-04 HISTORY — DX: Major depressive disorder, single episode, unspecified: F32.9

## 2011-10-04 HISTORY — DX: Depression, unspecified: F32.A

## 2011-10-04 HISTORY — DX: Anxiety disorder, unspecified: F41.9

## 2011-10-04 LAB — URINALYSIS, ROUTINE W REFLEX MICROSCOPIC
Bilirubin Urine: NEGATIVE
Glucose, UA: NEGATIVE mg/dL
Hgb urine dipstick: NEGATIVE
Ketones, ur: NEGATIVE mg/dL
Leukocytes, UA: NEGATIVE
Nitrite: NEGATIVE
Protein, ur: NEGATIVE mg/dL
Specific Gravity, Urine: 1.01 (ref 1.005–1.030)
Urobilinogen, UA: 0.2 mg/dL (ref 0.0–1.0)
pH: 7.5 (ref 5.0–8.0)

## 2011-10-04 LAB — WET PREP, GENITAL
Trich, Wet Prep: NONE SEEN
Yeast Wet Prep HPF POC: NONE SEEN

## 2011-10-04 LAB — POCT PREGNANCY, URINE: Preg Test, Ur: NEGATIVE

## 2011-10-04 MED ORDER — METRONIDAZOLE 500 MG PO TABS: 500.0000 mg | ORAL_TABLET | Freq: Two times a day (BID) | ORAL | Status: AC

## 2011-10-04 MED ORDER — CEFTRIAXONE SODIUM 250 MG IJ SOLR
250.0000 mg | INTRAMUSCULAR | Status: AC
  Administered 2011-10-04: 250 mg via INTRAMUSCULAR
  Filled 2011-10-04: qty 250

## 2011-10-04 MED ORDER — KETOROLAC TROMETHAMINE 30 MG/ML IJ SOLN
30.0000 mg | INTRAMUSCULAR | Status: AC
  Administered 2011-10-04: 30 mg via INTRAVENOUS
  Filled 2011-10-04: qty 1

## 2011-10-04 MED ORDER — DOXYCYCLINE HYCLATE 100 MG PO TABS: 100.0000 mg | ORAL_TABLET | Freq: Two times a day (BID) | ORAL | Status: AC

## 2011-10-04 NOTE — MAU Provider Note (Signed)
History     CSN: [id number]  Arrival date and time: 10/04/11 2725   First Provider Initiated Contact with Patient 10/04/11 [phone]     25 [id number]  Chief Complaint  Patient presents with  . Ovarian Cyst   HPI Pt presents to MAU with LLQ pain x3 days described as sharp, constant and 7/10 on pain scale.  She reports similar pain on her right side a few months ago which was evaluated at [facility] with a diagnosis per the pt of kidney stones, ovarian cysts, and BV. She denies vaginal bleeding/itching/burning/disharge, urinary symptoms, n/v, fever/chills, h/a, or dizziness.  OB History    Grav Para Term Preterm Abortions TAB SAB Ect Mult Living   2 1 1  1  1   1       Past Medical History  Diagnosis Date  . Hx MRSA infection   . Anxiety   . Depression     Past Surgical History  Procedure Date  . Cesarean section     2 vessel cord  . Dilation and curettage of uterus     Family History  Problem Relation Age of Onset  . Anesthesia problems Neg Hx     History  Substance Use Topics  . Smoking status: Current Everyday Smoker -- 1.0 packs/day    Types: Cigarettes  . Smokeless tobacco: Never Used  . Alcohol Use: Yes     occasionally    Allergies: No Known Allergies  Prescriptions prior to admission  Medication Sig Dispense Refill  . citalopram (CELEXA) 20 MG tablet Take 20 mg by mouth daily.      . diclofenac (VOLTAREN) 50 MG EC tablet Take 50 mg by mouth 2 (two) times daily as needed. For pain        Review of Systems  Constitutional: Negative for fever, chills and malaise/fatigue.  Eyes: Negative for blurred vision.  Respiratory: Negative for cough and shortness of breath.   Cardiovascular: Negative for chest pain.  Gastrointestinal: Positive for abdominal pain. Negative for heartburn, nausea and vomiting.  Genitourinary: Negative for dysuria, urgency and frequency.  Musculoskeletal: Negative.   Neurological: Negative for dizziness and headaches.    Psychiatric/Behavioral: Negative for depression.   Physical Exam   Blood pressure 116/73, pulse 80, temperature 97.2 F (36.2 C), temperature source Oral, resp. rate 16, height 5\' 6"  (1.676 m), weight 85.911 kg (189 lb 6.4 oz), last menstrual period 09/20/2011, SpO2 99.00%.  Physical Exam  Nursing note and vitals reviewed. Constitutional: She is oriented to person, place, and time. She appears well-developed and well-nourished.  Neck: Normal range of motion.  Cardiovascular: Normal rate.   Respiratory: Effort normal.  GI: Soft.  Musculoskeletal: Normal range of motion.  Neurological: She is alert and oriented to person, place, and time.  Skin: Skin is warm and dry.  Psychiatric: She has a normal mood and affect. Her behavior is normal. Judgment and thought content normal.   Pelvic exam: Cervix pink with visible ectropion, scant white discharge, vagina and external genitalia normal Bimanual exam:  Cervix 0/long/high, positive CMT, uterus nonpregnant size, anteflexed, adnexa with mild tenderness on left, no enlargement or mass palpable  MAU Course  Procedures U/A, Pelvic exam with wet prep/cultures, Pelvic U/S  Results for orders placed during the hospital encounter of 10/04/11 (from the past 24 hour(s))  URINALYSIS, ROUTINE W REFLEX MICROSCOPIC     Status: Normal   Collection Time   10/04/11  9:35 AM      Component Value Range  Color, Urine YELLOW  YELLOW    APPearance CLEAR  CLEAR    Specific Gravity, Urine 1.010  1.005 - 1.030    pH 7.5  5.0 - 8.0    Glucose, UA NEGATIVE  NEGATIVE (mg/dL)   Hgb urine dipstick NEGATIVE  NEGATIVE    Bilirubin Urine NEGATIVE  NEGATIVE    Ketones, ur NEGATIVE  NEGATIVE (mg/dL)   Protein, ur NEGATIVE  NEGATIVE (mg/dL)   Urobilinogen, UA 0.2  0.0 - 1.0 (mg/dL)   Nitrite NEGATIVE  NEGATIVE    Leukocytes, UA NEGATIVE  NEGATIVE   POCT PREGNANCY, URINE     Status: Normal   Collection Time   10/04/11  9:55 AM      Component Value Range   Preg  Test, Ur NEGATIVE  NEGATIVE   WET PREP, GENITAL     Status: Abnormal   Collection Time   10/04/11 10:10 AM      Component Value Range   Yeast Wet Prep HPF POC NONE SEEN  NONE SEEN    Trich, Wet Prep NONE SEEN  NONE SEEN    Clue Cells Wet Prep HPF POC MODERATE (*) NONE SEEN    WBC, Wet Prep HPF POC MODERATE (*) NONE SEEN    Imaging: US Transvaginal Non-ob  10/04/2011  *RADIOLOGY REPORT*  Clinical Data: Left lower quadrant pain.  LMP 09/20/2011  TRANSABDOMINAL AND TRANSVAGINAL ULTRASOUND OF PELVIS Technique:  Both transabdominal and transvaginal ultrasound examinations of the pelvis were performed. Transabdominal technique was performed for global imaging of the pelvis including uterus, ovaries, adnexal regions, and pelvic cul-de-sac.  Comparison: 11/2007   It was necessary to proceed with endovaginal exam following the transabdominal exam to visualize the endometrium, myometrium and adnexa.  Findings:  Uterus: Demonstrates a sagittal length of 8.3 cm, depth of 3.7 cm and width of 5.0 cm.  A homogeneous myometrium is seen.  Endometrium: Appears trilayered with an AP width of 8.4 mm. Findings would correlate with a late proliferative phase endometrium and correspond with the provided LMP of 09/20/2011.  No areas of focal thickening or heterogeneity are seen  Right ovary:  Has a normal appearance measuring 3.2 x 2.3 x 2.2 cm  Left ovary: Has a normal appearance measuring 3.1 x 2.5 x 3.3 cm.  Other findings: No pelvic fluid or separate adnexal masses are seen.  IMPRESSION: Normal proliferative phase pelvic ultrasound.  Original Report Authenticated By: Bertha Stakes, [name]   US Pelvis Complete  10/04/2011  *RADIOLOGY REPORT*  Clinical Data: Left lower quadrant pain.  LMP 09/20/2011  TRANSABDOMINAL AND TRANSVAGINAL ULTRASOUND OF PELVIS Technique:  Both transabdominal and transvaginal ultrasound examinations of the pelvis were performed. Transabdominal technique was performed for global imaging of the pelvis  including uterus, ovaries, adnexal regions, and pelvic cul-de-sac.  Comparison: 11/2007   It was necessary to proceed with endovaginal exam following the transabdominal exam to visualize the endometrium, myometrium and adnexa.  Findings:  Uterus: Demonstrates a sagittal length of 8.3 cm, depth of 3.7 cm and width of 5.0 cm.  A homogeneous myometrium is seen.  Endometrium: Appears trilayered with an AP width of 8.4 mm. Findings would correlate with a late proliferative phase endometrium and correspond with the provided LMP of 09/20/2011.  No areas of focal thickening or heterogeneity are seen  Right ovary:  Has a normal appearance measuring 3.2 x 2.3 x 2.2 cm  Left ovary: Has a normal appearance measuring 3.1 x 2.5 x 3.3 cm.  Other findings: No pelvic fluid or  separate adnexal masses are seen.  IMPRESSION: Normal proliferative phase pelvic ultrasound.  Original Report Authenticated By: Bertha Stakes, [name]    Assessment and Plan  A: PID BV  P: Toradol 30 mg IM x1 dose in MAU--Pt reports significant relief Rocephin 250 mg IM x1 dose in MAU Prescription for Doxycycline 100 mg BID x14 days Flagyl BID x7 days Continue to take NSAID prescribed recently by primary care provider Take medications with food and drink plenty of water  D/C home Pt has appointment with Dr [name] for Gyn visit on Monday F/U with Dr [name] and return to MAU as needed  LEFTWICH-KIRBY, Quran Vasco 10/04/2011, 10:21 AM

## 2011-10-04 NOTE — Discharge Instructions (Signed)
Pelvic Inflammatory Disease  Pelvic Inflammatory Disease (PID) is an infection in some or all of your female organs. This includes the womb (uterus), ovaries, fallopian tubes and tissues in the pelvis. PID is a common cause of sudden onset (acute) lower abdominal (pelvic) pain. PID can be treated, but it is a serious infection. It may take weeks before you are completely well. In some cases, hospitalization is needed for surgery or to administer medications to kill germs (antibiotics) through your veins (intravenously).  CAUSES    It may be caused by germs that are spread during sexual contact.   PID can also occur following:   The birth of a baby.   A miscarriage.   An abortion.   Major surgery of the pelvis.   Use of an IUD.   Sexual assault.  SYMPTOMS    Abdominal or pelvic pain.   Fever.   Chills.   Abnormal vaginal discharge.  DIAGNOSIS   Your caregiver will choose some of these methods to make a diagnosis:   A physical exam and history.   Blood tests.   Cultures of the vagina and cervix.   X-rays or ultrasound.   A procedure to look inside the pelvis (laparoscopy).  TREATMENT    Use of antibiotics by mouth or intravenously.   Treatment of sexual partners when the infection is an sexually transmitted disease (STD).   Hospitalization and surgery may be needed.  RISKS AND COMPLICATIONS    PID can cause women to become unable to have children (sterile) if left untreated or if partially treated. That is why it is important to finish all medications given to you.   Sterility or future tubal (ectopic) pregnancies can occur in fully treated individuals. This is why it is so important to follow your prescribed treatment.   It can cause longstanding (chronic) pelvic pain after frequent infections.   Painful intercourse.   Pelvic abscesses.   In rare cases, surgery or a hysterectomy may be needed.   If this is a sexually transmitted infection (STI), you are also at risk for any other STD  including AIDSor human papillomavirus (HPV).  HOME CARE INSTRUCTIONS    Finish all medication as prescribed. Incomplete treatment will put you at risk for sterility and tubal pregnancy.   Only take over-the-counter or prescription medicines for pain, discomfort, or fever as directed by your caregiver.   Do not have sex until treatment is completed or as directed by your caregiver. If PID is confirmed, your recent sexual contacts will need treatment.   Keep your follow-up appointments.  SEEK MEDICAL CARE IF:    You have increased or abnormal vaginal discharge.   You need prescription medication for your pain.   Your partner has an STD.   You are vomiting.   You cannot take your medications.  SEEK IMMEDIATE MEDICAL CARE IF:    You have a fever.   You develop increased abdominal or pelvic pain.   You develop chills.   You have pain when you urinate.   You are not better after 72 hours following treatment.  Document Released: 06/17/2005 Document Revised: 06/06/2011 Document Reviewed: 02/28/2007  [employer] Patient Information 2012 [employer].

## 2011-10-04 NOTE — MAU Note (Signed)
Patient c/o lower left side pain, has had a negative pregnancy test this am.

## 2011-10-04 NOTE — MAU Note (Signed)
Pt states hx of ovarian cysts, having left lower abd pain x3 days, rates 7/10. Denies bleeding or abnormal vaginal d/c changes. LMP-09/20/2011.

## 2011-10-05 LAB — GC/CHLAMYDIA PROBE AMP, GENITAL
Chlamydia, DNA Probe: NEGATIVE
GC Probe Amp, Genital: NEGATIVE

## 2012-02-11 ENCOUNTER — Ambulatory Visit: Admit: 2012-02-11 | Payer: Self-pay | Admitting: Obstetrics and Gynecology

## 2012-02-11 SURGERY — LAPAROSCOPY OPERATIVE
Anesthesia: General

## 2012-02-21 ENCOUNTER — Encounter: Payer: Self-pay | Admitting: Family Medicine

## 2012-02-21 ENCOUNTER — Ambulatory Visit: Payer: 59 | Admitting: Family Medicine

## 2012-03-18 ENCOUNTER — Encounter (HOSPITAL_COMMUNITY): Payer: Self-pay | Admitting: Pharmacist

## 2012-03-23 ENCOUNTER — Encounter (HOSPITAL_COMMUNITY)
Admission: RE | Admit: 2012-03-23 | Discharge: 2012-03-23 | Disposition: A | Discharge status: Home / Self Care | Payer: BC Managed Care – PPO | Source: Ambulatory Visit | Attending: Obstetrics and Gynecology | Admitting: Obstetrics and Gynecology

## 2012-03-23 ENCOUNTER — Encounter (HOSPITAL_COMMUNITY): Payer: Self-pay

## 2012-03-23 LAB — CBC
HCT: 39.5 % (ref 36.0–46.0)
Hemoglobin: 13.4 g/dL (ref 12.0–15.0)
MCH: 31.3 pg (ref 26.0–34.0)
MCHC: 33.9 g/dL (ref 30.0–36.0)
MCV: 92.3 fL (ref 78.0–100.0)
Platelets: 180 10*3/uL (ref 150–400)
RBC: 4.28 MIL/uL (ref 3.87–5.11)
RDW: 12.7 % (ref 11.5–15.5)
WBC: 7.2 10*3/uL (ref 4.0–10.5)

## 2012-03-23 LAB — SURGICAL PCR SCREEN
MRSA, PCR: NEGATIVE
Staphylococcus aureus: NEGATIVE

## 2012-03-23 NOTE — Patient Instructions (Signed)
Your procedure is scheduled on:03/31/12  Enter through the Main Entrance at :6am Pick up desk phone and dial [phone] and inform us of your arrival.  Please call [phone] if you have any problems the morning of surgery.  Remember: Do not eat after midnight:Monday Do not drink after:midnight Monday  Take these meds the morning of surgery with a sip of water:none  DO NOT wear jewelry, eye make-up, lipstick,body lotion, or dark fingernail polish. Do not shave for 48 hours prior to surgery.  If you are to be admitted after surgery, leave suitcase in car until your room has been assigned. Patients discharged on the day of surgery will not be allowed to drive home.   Remember to use your Hibiclens as instructed.

## 2012-03-24 NOTE — H&P (Signed)
NILANI HUGILL is an 26 y.o. female with pelvic pain and heavy menses. U/S in office c/w 2.5 cm simple cyst left ovary.  Pertinent Gynecological History: Menses: flow is excessive with use of many pads or tampons on heaviest days Bleeding: N/A Contraception: abstinence DES exposure: denies Blood transfusions: none Sexually transmitted diseases: no past history Previous GYN Procedures: none  Last mammogram: not done Date: N/A Last pap: normal Date: 2013 OB History: G2, P1   Menstrual History: Menarche age: unknown No LMP recorded.    Past Medical History  Diagnosis Date  . Hx MRSA infection   . Anxiety   . Depression   . Asthma 2003    Past Surgical History  Procedure Date  . Cesarean section     2 vessel cord  . Dilation and curettage of uterus     Family History  Problem Relation Age of Onset  . Anesthesia problems Neg Hx     Social History:  reports that she has been smoking Cigarettes.  She has been smoking about 1 pack per day. She has never used smokeless tobacco. She reports that she drinks alcohol. She reports that she does not use illicit drugs.  Allergies: No Known Allergies  No prescriptions prior to admission    Review of Systems  Constitutional: Negative for fever and chills.  Gastrointestinal: Negative for nausea and vomiting.    There were no vitals taken for this visit. Physical Exam  Cardiovascular: Normal rate and regular rhythm.   Respiratory: Effort normal and breath sounds normal.  GI: Soft. Bowel sounds are normal. There is no tenderness.  Neurological: She has normal reflexes.    Results for orders placed during the hospital encounter of 03/23/12 (from the past 24 hour(s))  SURGICAL PCR SCREEN     Status: Normal   Collection Time   03/23/12  3:50 PM      Component Value Range   MRSA, PCR NEGATIVE  NEGATIVE   Staphylococcus aureus NEGATIVE  NEGATIVE  CBC     Status: Normal   Collection Time   03/23/12  4:00 PM      Component  Value Range   WBC 7.2  4.0 - 10.5 K/uL   RBC 4.28  3.87 - 5.11 MIL/uL   Hemoglobin 13.4  12.0 - 15.0 g/dL   HCT 16.1  09.6 - 04.5 %   MCV 92.3  78.0 - 100.0 fL   MCH 31.3  26.0 - 34.0 pg   MCHC 33.9  30.0 - 36.0 g/dL   RDW 40.9  81.1 - 91.4 %   Platelets 180  150 - 400 K/uL    No results found.  Assessment/Plan: 26 yo with random, bilateral pelvic pain more on left and 2.5 cm simple cyst on left ovary for laparoscopy and hysterscopy D&C Risks benefits reviewed. Janaiya Beauchesne II,Danissa Rundle E 03/24/2012, 11:05 AM

## 2012-03-31 ENCOUNTER — Encounter (HOSPITAL_COMMUNITY): Payer: Self-pay | Admitting: Anesthesiology

## 2012-03-31 ENCOUNTER — Encounter (HOSPITAL_COMMUNITY)
Admission: RE | Disposition: A | Discharge status: Home / Self Care | Payer: Self-pay | Source: Ambulatory Visit | Attending: Obstetrics and Gynecology

## 2012-03-31 ENCOUNTER — Ambulatory Visit (HOSPITAL_COMMUNITY)
Admission: RE | Admit: 2012-03-31 | Discharge: 2012-03-31 | Disposition: A | Discharge status: Home / Self Care | Payer: BC Managed Care – PPO | Source: Ambulatory Visit | Attending: Obstetrics and Gynecology | Admitting: Obstetrics and Gynecology

## 2012-03-31 ENCOUNTER — Ambulatory Visit (HOSPITAL_COMMUNITY): Payer: BC Managed Care – PPO | Admitting: Anesthesiology

## 2012-03-31 DIAGNOSIS — N803 Endometriosis of pelvic peritoneum, unspecified: Secondary | ICD-10-CM | POA: Insufficient documentation

## 2012-03-31 DIAGNOSIS — N920 Excessive and frequent menstruation with regular cycle: Secondary | ICD-10-CM | POA: Insufficient documentation

## 2012-03-31 DIAGNOSIS — Q504 Embryonic cyst of fallopian tube: Secondary | ICD-10-CM | POA: Insufficient documentation

## 2012-03-31 DIAGNOSIS — N949 Unspecified condition associated with female genital organs and menstrual cycle: Secondary | ICD-10-CM | POA: Insufficient documentation

## 2012-03-31 HISTORY — PX: LAPAROSCOPY: SHX197

## 2012-03-31 HISTORY — PX: HYSTEROSCOPY W/D&C: SHX1775

## 2012-03-31 LAB — PREGNANCY, URINE: Preg Test, Ur: NEGATIVE

## 2012-03-31 SURGERY — DILATATION AND CURETTAGE /HYSTEROSCOPY
Anesthesia: General | Wound class: Clean Contaminated

## 2012-03-31 MED ORDER — PROPOFOL 10 MG/ML IV EMUL
INTRAVENOUS | Status: DC | PRN
  Administered 2012-03-31: 150 mg via INTRAVENOUS

## 2012-03-31 MED ORDER — CEFAZOLIN SODIUM-DEXTROSE 2-3 GM-% IV SOLR: 2.0000 g | INTRAVENOUS | Status: DC

## 2012-03-31 MED ORDER — BUPIVACAINE HCL (PF) 0.5 % IJ SOLN
INTRAMUSCULAR | Status: AC
  Filled 2012-03-31: qty 30

## 2012-03-31 MED ORDER — LIDOCAINE HCL (CARDIAC) 20 MG/ML IV SOLN
INTRAVENOUS | Status: AC
  Filled 2012-03-31: qty 5

## 2012-03-31 MED ORDER — LACTATED RINGERS IV SOLN
INTRAVENOUS | Status: DC
  Administered 2012-03-31 (×3): via INTRAVENOUS

## 2012-03-31 MED ORDER — KETOROLAC TROMETHAMINE 30 MG/ML IJ SOLN
INTRAMUSCULAR | Status: DC | PRN
  Administered 2012-03-31: 30 mg via INTRAVENOUS

## 2012-03-31 MED ORDER — FENTANYL CITRATE 0.05 MG/ML IJ SOLN
25.0000 ug | INTRAMUSCULAR | Status: DC | PRN
  Administered 2012-03-31: 50 ug via INTRAVENOUS

## 2012-03-31 MED ORDER — FENTANYL CITRATE 0.05 MG/ML IJ SOLN
INTRAMUSCULAR | Status: AC
  Administered 2012-03-31: 50 ug via INTRAVENOUS
  Filled 2012-03-31: qty 2

## 2012-03-31 MED ORDER — DEXAMETHASONE SODIUM PHOSPHATE 10 MG/ML IJ SOLN
INTRAMUSCULAR | Status: AC
  Filled 2012-03-31: qty 1

## 2012-03-31 MED ORDER — DEXAMETHASONE SODIUM PHOSPHATE 4 MG/ML IJ SOLN
INTRAMUSCULAR | Status: DC | PRN
  Administered 2012-03-31: 8 mg via INTRAVENOUS

## 2012-03-31 MED ORDER — LIDOCAINE HCL (CARDIAC) 20 MG/ML IV SOLN
INTRAVENOUS | Status: DC | PRN
  Administered 2012-03-31: 30 mg via INTRAVENOUS

## 2012-03-31 MED ORDER — PROPOFOL 10 MG/ML IV EMUL
INTRAVENOUS | Status: AC
  Filled 2012-03-31: qty 20

## 2012-03-31 MED ORDER — METOCLOPRAMIDE HCL 5 MG/ML IJ SOLN: 10.0000 mg | Freq: Once | INTRAMUSCULAR | Status: DC | PRN

## 2012-03-31 MED ORDER — HYDROCODONE-ACETAMINOPHEN 5-325 MG PO TABS: 2.0000 | ORAL_TABLET | Freq: Four times a day (QID) | ORAL | Status: DC | PRN

## 2012-03-31 MED ORDER — BUPIVACAINE HCL (PF) 0.5 % IJ SOLN
INTRAMUSCULAR | Status: DC | PRN
  Administered 2012-03-31: 30 mL

## 2012-03-31 MED ORDER — GLYCOPYRROLATE 0.2 MG/ML IJ SOLN
INTRAMUSCULAR | Status: DC | PRN
  Administered 2012-03-31: .8 mg via INTRAVENOUS

## 2012-03-31 MED ORDER — ONDANSETRON HCL 4 MG/2ML IJ SOLN
INTRAMUSCULAR | Status: AC
  Filled 2012-03-31: qty 2

## 2012-03-31 MED ORDER — ROCURONIUM BROMIDE 100 MG/10ML IV SOLN
INTRAVENOUS | Status: DC | PRN
  Administered 2012-03-31: 30 mg via INTRAVENOUS
  Administered 2012-03-31: 20 mg via INTRAVENOUS

## 2012-03-31 MED ORDER — LIDOCAINE HCL 1 % IJ SOLN
INTRAMUSCULAR | Status: DC | PRN
  Administered 2012-03-31: 10 mL

## 2012-03-31 MED ORDER — MIDAZOLAM HCL 5 MG/5ML IJ SOLN
INTRAMUSCULAR | Status: DC | PRN
  Administered 2012-03-31: 2 mg via INTRAVENOUS

## 2012-03-31 MED ORDER — ONDANSETRON HCL 4 MG/2ML IJ SOLN
INTRAMUSCULAR | Status: DC | PRN
  Administered 2012-03-31: 4 mg via INTRAVENOUS

## 2012-03-31 MED ORDER — GLYCOPYRROLATE 0.2 MG/ML IJ SOLN
INTRAMUSCULAR | Status: AC
  Filled 2012-03-31: qty 2

## 2012-03-31 MED ORDER — 0.9 % SODIUM CHLORIDE (POUR BTL) OPTIME
TOPICAL | Status: DC | PRN
  Administered 2012-03-31: 1000 mL

## 2012-03-31 MED ORDER — CEFAZOLIN SODIUM-DEXTROSE 2-3 GM-% IV SOLR
INTRAVENOUS | Status: AC
  Administered 2012-03-31: 2 g via INTRAVENOUS
  Filled 2012-03-31: qty 50

## 2012-03-31 MED ORDER — FENTANYL CITRATE 0.05 MG/ML IJ SOLN
INTRAMUSCULAR | Status: AC
  Filled 2012-03-31: qty 5

## 2012-03-31 MED ORDER — KETOROLAC TROMETHAMINE 30 MG/ML IJ SOLN
INTRAMUSCULAR | Status: AC
  Filled 2012-03-31: qty 1

## 2012-03-31 MED ORDER — NEOSTIGMINE METHYLSULFATE 1 MG/ML IJ SOLN
INTRAMUSCULAR | Status: DC | PRN
  Administered 2012-03-31: 4 mg via INTRAVENOUS

## 2012-03-31 MED ORDER — MEPERIDINE HCL 25 MG/ML IJ SOLN: 6.2500 mg | INTRAMUSCULAR | Status: DC | PRN

## 2012-03-31 MED ORDER — GLYCINE 1.5 % IR SOLN
Status: DC | PRN
  Administered 2012-03-31: 3000 mL

## 2012-03-31 MED ORDER — FENTANYL CITRATE 0.05 MG/ML IJ SOLN
INTRAMUSCULAR | Status: DC | PRN
Start: 2012-03-31 — End: 2012-03-31
  Administered 2012-03-31: 100 ug via INTRAVENOUS
  Administered 2012-03-31: 50 ug via INTRAVENOUS
  Administered 2012-03-31: 50 ug
  Administered 2012-03-31: 100 ug via INTRAVENOUS

## 2012-03-31 MED ORDER — ROCURONIUM BROMIDE 50 MG/5ML IV SOLN
INTRAVENOUS | Status: AC
  Filled 2012-03-31: qty 1

## 2012-03-31 MED ORDER — MIDAZOLAM HCL 2 MG/2ML IJ SOLN
INTRAMUSCULAR | Status: AC
  Filled 2012-03-31: qty 2

## 2012-03-31 MED ORDER — NEOSTIGMINE METHYLSULFATE 1 MG/ML IJ SOLN
INTRAMUSCULAR | Status: AC
  Filled 2012-03-31: qty 10

## 2012-03-31 MED ORDER — SODIUM CHLORIDE 0.9 % IJ SOLN
INTRAMUSCULAR | Status: DC | PRN
  Administered 2012-03-31: 3 mL via INTRAVENOUS

## 2012-03-31 MED ORDER — PROPOFOL 10 MG/ML IV EMUL: INTRAVENOUS | Status: DC | PRN

## 2012-03-31 SURGICAL SUPPLY — 39 items
ADH SKN CLS APL DERMABOND .7 (GAUZE/BANDAGES/DRESSINGS) ×1
BAG SPEC RTRVL LRG 6X4 10 (ENDOMECHANICALS)
BARRIER ADHS 3X4 INTERCEED (GAUZE/BANDAGES/DRESSINGS) IMPLANT
BRR ADH 4X3 ABS CNTRL BYND (GAUZE/BANDAGES/DRESSINGS)
CABLE HIGH FREQUENCY MONO STRZ (ELECTRODE) IMPLANT
CANISTER SUCTION 2500CC (MISCELLANEOUS) ×2 IMPLANT
CATH ROBINSON RED A/P 16FR (CATHETERS) ×2 IMPLANT
CLOTH BEACON ORANGE TIMEOUT ST (SAFETY) ×2 IMPLANT
CONTAINER PREFILL 10% NBF 60ML (FORM) ×4 IMPLANT
DERMABOND ADVANCED (GAUZE/BANDAGES/DRESSINGS) ×1
DERMABOND ADVANCED .7 DNX12 (GAUZE/BANDAGES/DRESSINGS) ×1 IMPLANT
DRESSING TELFA 8X3 (GAUZE/BANDAGES/DRESSINGS) ×2 IMPLANT
ELECT REM PT RETURN 9FT ADLT (ELECTROSURGICAL)
ELECTRODE REM PT RTRN 9FT ADLT (ELECTROSURGICAL) IMPLANT
GLOVE BIO SURGEON STRL SZ8 (GLOVE) ×4 IMPLANT
GOWN PREVENTION PLUS LG XLONG (DISPOSABLE) ×2 IMPLANT
GOWN STRL REIN XL XLG (GOWN DISPOSABLE) ×4 IMPLANT
LOOP ANGLED CUTTING 22FR (CUTTING LOOP) IMPLANT
NDL INSUFFLATION 14GA 120MM (NEEDLE) ×1 IMPLANT
NEEDLE INSUFFLATION 14GA 120MM (NEEDLE) ×2 IMPLANT
NS IRRIG 1000ML POUR BTL (IV SOLUTION) ×2 IMPLANT
PACK HYSTEROSCOPY LF (CUSTOM PROCEDURE TRAY) ×2 IMPLANT
PACK LAPAROSCOPY BASIN (CUSTOM PROCEDURE TRAY) ×2 IMPLANT
PAD OB MATERNITY 4.3X12.25 (PERSONAL CARE ITEMS) ×2 IMPLANT
POUCH SPECIMEN RETRIEVAL 10MM (ENDOMECHANICALS) IMPLANT
PROTECTOR NERVE ULNAR (MISCELLANEOUS) ×2 IMPLANT
SEALER TISSUE G2 CVD JAW 35 (ENDOMECHANICALS) IMPLANT
SEALER TISSUE G2 CVD JAW 45CM (ENDOMECHANICALS) IMPLANT
SET IRRIG TUBING LAPAROSCOPIC (IRRIGATION / IRRIGATOR) IMPLANT
STRIP CLOSURE SKIN 1/4X3 (GAUZE/BANDAGES/DRESSINGS) IMPLANT
SUT VIC AB 3-0 PS2 18 (SUTURE)
SUT VIC AB 3-0 PS2 18XBRD (SUTURE) IMPLANT
SUT VICRYL 0 UR6 27IN ABS (SUTURE) ×2 IMPLANT
SYR 30ML LL (SYRINGE) IMPLANT
TOWEL OR 17X24 6PK STRL BLUE (TOWEL DISPOSABLE) ×4 IMPLANT
TROCAR XCEL NON-BLD 11X100MML (ENDOMECHANICALS) IMPLANT
TROCAR XCEL NON-BLD 5MMX100MML (ENDOMECHANICALS) IMPLANT
WARMER LAPAROSCOPE (MISCELLANEOUS) ×2 IMPLANT
WATER STERILE IRR 1000ML POUR (IV SOLUTION) ×2 IMPLANT

## 2012-03-31 NOTE — Transfer of Care (Signed)
Immediate Anesthesia Transfer of Care Note  Patient: [name]  Procedure(s) Performed: Procedure(s) (LRB) with comments: DILATATION AND CURETTAGE /HYSTEROSCOPY (N/A) LAPAROSCOPY OPERATIVE (N/A) - with Biopsies of Bilateral Fallopian Tubes; Fulguration of Endometriosis  Patient Location: PACU  Anesthesia Type: General  Level of Consciousness: awake, alert , oriented and patient cooperative  Airway & Oxygen Therapy: Patient Spontanous Breathing and Patient connected to nasal cannula oxygen  Post-op Assessment: Report given to PACU RN, Post -op Vital signs reviewed and stable and Patient moving all extremities  Post vital signs: Reviewed and stable  Complications: No apparent anesthesia complications

## 2012-03-31 NOTE — Discharge Instructions (Signed)
DISCHARGE INSTRUCTIONS: Laparoscopy  The following instructions have been prepared to help you care for yourself upon your return home today.  **You may take Ibuprofen/Motrin/Advil after 2:15 pm today**  Wound care:  Do not get the incision wet for the first 24 hours. The incision should be kept clean and dry.  The Band-Aids or dressings may be removed the day after surgery.  Should the incision become sore, red, and swollen after the first week, check with your doctor.  Personal hygiene:  Shower the day after your procedure.  Activity and limitations:  Do NOT drive or operate any equipment today.  Do NOT lift anything more than 15 pounds for 2-3 weeks after surgery.  Do NOT rest in bed all day.  Walking is encouraged. Walk each day, starting slowly with 5-minute walks 3 or 4 times a day. Slowly increase the length of your walks.  Walk up and down stairs slowly.  Do NOT do strenuous activities, such as golfing, playing tennis, bowling, running, biking, weight lifting, gardening, mowing, or vacuuming for 2-4 weeks. Ask your doctor when it is okay to start. **No intercourse for 2 weeks** **No tub baths, pools or Jacuzzis for 2 weeks**   Diet: Eat a light meal as desired this evening. You may resume your usual diet tomorrow.  Return to work: This is dependent on the type of work you do. For the most part you can return to a desk job within a week of surgery. If you are more active at work, please discuss this with your doctor.  What to expect after your surgery: You may have a slight burning sensation when you urinate on the first day. You may have a very small amount of blood in the urine. Expect to have a small amount of vaginal discharge/light bleeding for 1-2 weeks. It is not unusual to have abdominal soreness and bruising for up to 2 weeks. You may be tired and need more rest for about 1 week. You may experience shoulder pain for 24-72 hours. Lying flat in bed may relieve  it.  Call your doctor for any of the following:  Develop a fever of 100.4 or greater  Inability to urinate 6 hours after discharge from hospital  Severe pain not relieved by pain medications  Persistent of heavy bleeding at incision site  Redness or swelling around incision site after a week  Increasing nausea or vomiting  Patient Signature________________________________________  Nurse Signature_________________________________________  Support person Signature_________________________________

## 2012-03-31 NOTE — Anesthesia Postprocedure Evaluation (Signed)
Anesthesia Post Note  Patient: [name]  Procedure(s) Performed: Procedure(s) (LRB): DILATATION AND CURETTAGE /HYSTEROSCOPY (N/A) LAPAROSCOPY OPERATIVE (N/A)  Anesthesia type: General  Patient location: PACU  Post pain: Pain level controlled  Post assessment: Post-op Vital signs reviewed  Last Vitals:  Filed Vitals:   03/31/12 0930  BP:   Pulse: 77  Temp: 36.9 C  Resp: 20    Post vital signs: Reviewed  Level of consciousness: sedated  Complications: No apparent anesthesia complications

## 2012-03-31 NOTE — Brief Op Note (Signed)
03/31/2012  8:20 AM  PATIENT:  [name]  26 y.o. female  PRE-OPERATIVE DIAGNOSIS:  Dysmenorrhea; Menorrhagia  POST-OPERATIVE DIAGNOSIS:  Dysmenorrhea; Menorrhagia  PROCEDURE:  Procedure(s) (LRB) with comments: DILATATION AND CURETTAGE /HYSTEROSCOPY (N/A) LAPAROSCOPY OPERATIVE (N/A) - with Biopsies of Bilateral Fallopian Tubes; Fulguration of Endometriosis  SURGEON:  Surgeon(s) and Role:    * Leslie Andrea, MD - Primary  PHYSICIAN ASSISTANT:   ASSISTANTS: none   ANESTHESIA:   general  EBL:  Total I/O In: 1000 [I.V.:1000] Out: 60 [Urine:50; Blood:10]  BLOOD ADMINISTERED:none  DRAINS: none   LOCAL MEDICATIONS USED:  MARCAINE    and LIDOCAINE   SPECIMEN:  Source of Specimen:  endometrial currettings, biopsy bilateral fallopian tubes  DISPOSITION OF SPECIMEN:  PATHOLOGY  COUNTS:  YES  TOURNIQUET:  * No tourniquets in log *  DICTATION: .Other Dictation: Dictation Number [id number]  PLAN OF CARE: Discharge to home after PACU  PATIENT DISPOSITION:  PACU - hemodynamically stable.   Delay start of Pharmacological VTE agent (>24hrs) due to surgical blood loss or risk of bleeding: not applicable

## 2012-03-31 NOTE — Anesthesia Preprocedure Evaluation (Signed)
Anesthesia Evaluation  Patient identified by MRN, date of birth, ID band Patient awake and Patient confused    Reviewed: Allergy & Precautions, H&P , NPO status , Patient's Chart, lab work & pertinent test results  Airway Mallampati: III TM Distance: >3 FB Neck ROM: full  Mouth opening: Limited Mouth Opening  Dental  (+) Teeth Intact   Pulmonary asthma ,  breath sounds clear to auscultation        Cardiovascular negative cardio ROS  Rhythm:regular Rate:Normal     Neuro/Psych PSYCHIATRIC DISORDERS Anxiety Depression negative neurological ROS     GI/Hepatic negative GI ROS, Neg liver ROS,   Endo/Other  negative endocrine ROS  Renal/GU negative Renal ROS  negative genitourinary   Musculoskeletal negative musculoskeletal ROS (+)   Abdominal   Peds  Hematology negative hematology ROS (+)   Anesthesia Other Findings   Reproductive/Obstetrics Menorrhagia  Dysmenorrhea                           Anesthesia Physical Anesthesia Plan  ASA: II  Anesthesia Plan: General ETT   Post-op Pain Management:    Induction:   Airway Management Planned:   Additional Equipment:   Intra-op Plan:   Post-operative Plan:   Informed Consent: I have reviewed the patients History and Physical, chart, labs and discussed the procedure including the risks, benefits and alternatives for the proposed anesthesia with the patient or authorized representative who has indicated his/her understanding and acceptance.   Dental Advisory Given  Plan Discussed with: Anesthesiologist, CRNA and Surgeon  Anesthesia Plan Comments:         Anesthesia Quick Evaluation

## 2012-04-01 ENCOUNTER — Encounter (HOSPITAL_COMMUNITY): Payer: Self-pay | Admitting: Obstetrics and Gynecology

## 2012-04-01 NOTE — Op Note (Signed)
[name], [name] NO.:  [id number]  MEDICAL RECORD NO.:  [id number]  LOCATION:  [facility]                          FACILITY:  [facility]  PHYSICIAN:  [name]. [name], [name] DATE OF BIRTH:  [birth year]  DATE OF PROCEDURE:  03/31/2012 DATE OF DISCHARGE:  03/31/2012                              OPERATIVE REPORT   PREOPERATIVE DIAGNOSES: 1. Pelvic pain. 2. Menorrhagia.  POSTOPERATIVE DIAGNOSES: 1. Endometriosis. 2. Pelvic adhesions. 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 4. Menorrhagia.  PROCEDURE:  Laparoscopy with biopsy of bilateral fallopian tubes, fulguration of endometriosis, lysis of adhesions, hysteroscopy, dilatation and curettage.  SURGEON:  [name]. [name], MD  ANESTHESIA:  General with endotracheal intubation.  ESTIMATED BLOOD LOSS:  Drops.  SPECIMENS: 1. Endometrial curettings. 2. Bilateral fallopian tube biopsies all to Pathology.  I'S AND O'S:  Distending media, 10 mL deficit.  INDICATIONS AND CONSENT:  This patient is a 26 year old, G2, P1 with pelvic pain and painful menses as well as heavy menses.  Details are dictated in the history and physical.  Laparoscopy, hysteroscopy, D and C has discussed with the patient preoperatively.  Potential risks and complications were reviewed preoperatively including, but not limited to, infection, organ damage, uterine perforation, bleeding requiring transfusion of blood products with HIV and hepatitis acquisition, DVT, PE, pneumonia.  Persistent or recurrent pelvic pain, painful intercourse, laparotomy are also reviewed.  All questions has been answered and consent is signed on the chart.  FINDINGS:  Endometrial cavities without abnormal structure.  Both fallopian tube and ostia were identified.  Abdominally, the upper abdomen was grossly normal.  The appendix was retrocecal and normal. Uterus is 6 weeks in size, smooth in contour.  Scarring in the anterior cul-de-sac on the vesicouterine  peritoneum from the previous C-section. There are 1-cm pedunculated cyst on the fimbriae of the fallopian tubes bilaterally.  There are light yellow and red implants of endometriosis lateral to the right uterosacral ligament and on the left pelvic sidewall.  Posterior cul-de-sac is clean.  PROCEDURE:  The patient was taken to the operating room where she was identified, placed in the dorsal supine position and general anesthesia was induced via endotracheal intubation.  She was placed in the dorsal lithotomy position.  Time-out was undertaken.  She was prepped abdominally and vaginally.  Bladder was straight catheterized and draped in a sterile fashion.  Bivalve speculum was placed in the vagina. Anterior cervical lip was injected with 1% Xylocaine and grasped with a single-tooth tenaculum.  Paracervical block was placed at 2, 4, 5, 7, 8, and 10 o'clock positions with approximately 20 mL of the same solution. Cervix was gently and progressively dilated.  Diagnostic hysteroscope was placed in the endocervical canal and advanced under direct visualization using this distending media.  The above findings were noted.  Hysteroscope was withdrawn and sharp curettage was carried out. Single-tooth tenaculum was replaced with a Hulka tenaculum, and attention was turned to the abdomen.  Small amount of 0.5% plain Marcaine was injected in the infraumbilical and suprapubic areas in the midline.  Small infraumbilical incision was made.  Veress needle was placed on the first attempt without difficulty.  A good syringe and drop test were  noted.  Two liters of gas were then insufflated under low pressure with good tympany in the right upper quadrant.  A 10/11 XL bladeless disposable trocar sleeve was then placed using direct visualization with the diagnostic laparoscope.  After placement, the operative scope was used.  Suprapubic incision was made in the midline and a 5-mm XL bladeless disposable trocar  sleeve was placed under direct visualization without difficulty.  The above findings were noted.  The cyst on the fallopian tubes were removed bilaterally.  Good hemostasis was maintained with bipolar cautery.  The course of the ureters was identified bilaterally and seemed to be cleared the areas of surgery. Bipolar cautery was then used to ablate the areas of endometriosis as described above.  Also, the adhesions in the anterior cul-de-sac were taken down sharply.  Bipolar cautery was used to maintain hemostasis. Irrigation was carried out.  A 26 mL of 0.5% plain Marcaine was then instilled in the peritoneal cavity.  Instruments were removed, suprapubic trocar sleeve was removed, pneumoperitoneum was reduced, and the umbilical trocar sleeve was removed.  The umbilical incision was closed with a 0 Vicryl suture in the subcutaneous tissues under good visualization.  Dermabond was placed on both incisions.  Hulka tenaculum was removed and good hemostasis was noted.  All counts were correct. The patient was awakened, taken to the recovery room in stable condition.     [name] [name], [name]     JET/MEDQ  D:  03/31/2012  T:  03/31/2012  Job:  [id number]

## 2012-09-08 ENCOUNTER — Encounter (HOSPITAL_COMMUNITY)
Admission: RE | Admit: 2012-09-08 | Discharge: 2012-09-08 | Disposition: A | Discharge status: Home / Self Care | Payer: Medicaid Other | Source: Ambulatory Visit | Attending: Obstetrics and Gynecology | Admitting: Obstetrics and Gynecology

## 2012-09-08 ENCOUNTER — Encounter (HOSPITAL_COMMUNITY): Payer: Self-pay

## 2012-09-08 LAB — SURGICAL PCR SCREEN
MRSA, PCR: NEGATIVE
Staphylococcus aureus: NEGATIVE

## 2012-09-08 LAB — CBC
HCT: 40.5 % (ref 36.0–46.0)
Hemoglobin: 13.9 g/dL (ref 12.0–15.0)
MCH: 31.8 pg (ref 26.0–34.0)
MCHC: 34.3 g/dL (ref 30.0–36.0)
MCV: 92.7 fL (ref 78.0–100.0)
Platelets: 180 10*3/uL (ref 150–400)
RBC: 4.37 MIL/uL (ref 3.87–5.11)
RDW: 12.6 % (ref 11.5–15.5)
WBC: 6.5 10*3/uL (ref 4.0–10.5)

## 2012-09-08 NOTE — Patient Instructions (Addendum)
20 [name]  09/08/2012   Your procedure is scheduled on:  09/15/12  Enter through the Main Entrance of [facility] at 6 AM.  Pick up the phone at the desk and dial [phone].   Call this number if you have problems the morning of surgery: [phone]   Remember:   Do not eat food:After Midnight.  Do not drink clear liquids: After Midnight.  Take these medicines the morning of surgery with A SIP OF WATER: NA   Do not wear jewelry, make-up or nail polish.  Do not wear lotions, powders, or perfumes. You may wear deodorant.  Do not shave 48 hours prior to surgery.  Do not bring valuables to the hospital.  Contacts, dentures or bridgework may not be worn into surgery.  Leave suitcase in the car. After surgery it may be brought to your room.  For patients admitted to the hospital, checkout time is 11:00 AM the day of discharge.   Patients discharged the day of surgery will not be allowed to drive home.  Name and phone number of your driver: NA  Special Instructions: Shower using CHG 2 nights before surgery and the night before surgery.  If you shower the day of surgery use CHG.  Use special wash - you have one bottle of CHG for all showers.  You should use approximately 1/3 of the bottle for each shower.   Please read over the following fact sheets that you were given: MRSA Information

## 2012-09-14 MED ORDER — CEFAZOLIN SODIUM-DEXTROSE 2-3 GM-% IV SOLR
2.0000 g | INTRAVENOUS | Status: AC
  Administered 2012-09-15: 2 g via INTRAVENOUS

## 2012-09-14 NOTE — H&P (Signed)
[name] is an 27 y.o. female. Laparoscopy in 2013 C/W endometriosis. Patient has recurrent premenstrual and menstrual pain, now debilitating. Has failed attempts at medical management. After a discussion of options, she is admitted for LAVH. One tube and ovary will be removed if abnormal, but will retain at least one ovary.  Pertinent Gynecological History: Menses: flow is moderate Bleeding: N/A Contraception: abstinence DES exposure: denies Blood transfusions: none Sexually transmitted diseases: no past history Previous GYN Procedures: laparoscopy  Last mammogram: N/A Date: N/A Last pap: normal Date: 2013 OB History: G2, P1   Menstrual History: Menarche age: unknown  No LMP recorded.    Past Medical History  Diagnosis Date  . Hx MRSA infection   . Anxiety   . Depression   . Asthma 2003    no inhaler use unless ill with cold etc    Past Surgical History  Procedure Laterality Date  . Cesarean section      2 vessel cord  . Dilation and curettage of uterus    . Hysteroscopy w/d&c  03/31/2012    Procedure: DILATATION AND CURETTAGE /HYSTEROSCOPY;  Surgeon: Leslie Andrea, MD;  Location: [facility];  Service: Gynecology;  Laterality: N/A;  . Laparoscopy  03/31/2012    Procedure: LAPAROSCOPY OPERATIVE;  Surgeon: Leslie Andrea, MD;  Location: [facility];  Service: Gynecology;  Laterality: N/A;  with Biopsies of Bilateral Fallopian Tubes; Fulguration of Endometriosis    Family History  Problem Relation Age of Onset  . Anesthesia problems Neg Hx     Social History:  reports that she has been smoking Cigarettes.  She has been smoking about 1.00 pack per day. She has never used smokeless tobacco. She reports that  drinks alcohol. She reports that she does not use illicit drugs.  Allergies:  Allergies  Allergen Reactions  . Dilaudid (Hydromorphone Hcl)     Felt hot, had delirium.    No prescriptions prior to admission    Review of Systems  Constitutional: Negative  for fever.  Genitourinary:       Painful menses  Neurological: Negative for headaches.    There were no vitals taken for this visit. Physical Exam  Constitutional: She appears well-developed and well-nourished.  Cardiovascular: Normal rate and regular rhythm.   Respiratory: Effort normal and breath sounds normal.  GI: Soft. Bowel sounds are normal. There is no tenderness.  Genitourinary:  Uterus normal size, NT Adnexa NT without masses    No results found for this or any previous visit (from the past 24 hour(s)).  No results found.  Assessment/Plan: 27 yo G2P1 who desires LAVH for endometriosis refractory to medical management. Sterilization from this has been emphasized. Alternatives discussed on multiple occassions.  D/W patient risks including infection, organ damage, bleeding/transfusion-HIV/Hep, DVT/PE, pneumonia, fistula, pelvic pain, abdominal pain, pain with intercourse, recurrent cyclic pain. All questions answered.  Amy Gothard II,Hadas Jessop E 09/14/2012, 6:06 PM

## 2012-09-15 ENCOUNTER — Encounter (HOSPITAL_COMMUNITY): Payer: Self-pay | Admitting: *Deleted

## 2012-09-15 ENCOUNTER — Encounter (HOSPITAL_COMMUNITY): Payer: Self-pay | Admitting: Anesthesiology

## 2012-09-15 ENCOUNTER — Encounter (HOSPITAL_COMMUNITY)
Admission: RE | Disposition: A | Discharge status: Home / Self Care | Payer: Self-pay | Source: Ambulatory Visit | Attending: Obstetrics and Gynecology

## 2012-09-15 ENCOUNTER — Observation Stay (HOSPITAL_COMMUNITY)
Admission: RE | Admit: 2012-09-15 | Discharge: 2012-09-16 | Disposition: A | Discharge status: Home / Self Care | Payer: Medicaid Other | Source: Ambulatory Visit | Attending: Obstetrics and Gynecology | Admitting: Obstetrics and Gynecology

## 2012-09-15 ENCOUNTER — Ambulatory Visit (HOSPITAL_COMMUNITY): Payer: Medicaid Other | Admitting: Anesthesiology

## 2012-09-15 DIAGNOSIS — N809 Endometriosis, unspecified: Secondary | ICD-10-CM | POA: Diagnosis present

## 2012-09-15 DIAGNOSIS — N946 Dysmenorrhea, unspecified: Secondary | ICD-10-CM | POA: Insufficient documentation

## 2012-09-15 DIAGNOSIS — N943 Premenstrual tension syndrome: Secondary | ICD-10-CM | POA: Insufficient documentation

## 2012-09-15 DIAGNOSIS — N803 Endometriosis of pelvic peritoneum, unspecified: Principal | ICD-10-CM | POA: Insufficient documentation

## 2012-09-15 HISTORY — PX: CYSTOSCOPY: SHX5120

## 2012-09-15 HISTORY — PX: LAPAROSCOPIC ASSISTED VAGINAL HYSTERECTOMY: SHX5398

## 2012-09-15 LAB — CBC
HCT: 35.5 % — ABNORMAL LOW (ref 36.0–46.0)
Hemoglobin: 12 g/dL (ref 12.0–15.0)
MCH: 31.3 pg (ref 26.0–34.0)
MCHC: 33.8 g/dL (ref 30.0–36.0)
MCV: 92.7 fL (ref 78.0–100.0)
Platelets: 171 10*3/uL (ref 150–400)
RBC: 3.83 MIL/uL — ABNORMAL LOW (ref 3.87–5.11)
RDW: 12.7 % (ref 11.5–15.5)
WBC: 8.8 10*3/uL (ref 4.0–10.5)

## 2012-09-15 LAB — HCG, SERUM, QUALITATIVE: Preg, Serum: NEGATIVE

## 2012-09-15 SURGERY — HYSTERECTOMY, VAGINAL, LAPAROSCOPY-ASSISTED
Anesthesia: General | Site: Uterus | Wound class: Clean Contaminated

## 2012-09-15 MED ORDER — BUPIVACAINE HCL (PF) 0.5 % IJ SOLN
INTRAMUSCULAR | Status: DC | PRN
  Administered 2012-09-15: 30 mL

## 2012-09-15 MED ORDER — ATROPINE SULFATE 0.4 MG/ML IJ SOLN
INTRAMUSCULAR | Status: DC | PRN
  Administered 2012-09-15: .2 mg via INTRAVENOUS

## 2012-09-15 MED ORDER — LIDOCAINE HCL (CARDIAC) 20 MG/ML IV SOLN
INTRAVENOUS | Status: AC
  Filled 2012-09-15: qty 5

## 2012-09-15 MED ORDER — PROPOFOL 10 MG/ML IV EMUL
INTRAVENOUS | Status: DC | PRN
  Administered 2012-09-15: 200 mg via INTRAVENOUS

## 2012-09-15 MED ORDER — MORPHINE SULFATE 10 MG/ML IJ SOLN
INTRAMUSCULAR | Status: AC
  Filled 2012-09-15: qty 1

## 2012-09-15 MED ORDER — GLYCOPYRROLATE 0.2 MG/ML IJ SOLN
INTRAMUSCULAR | Status: AC
  Filled 2012-09-15: qty 2

## 2012-09-15 MED ORDER — IBUPROFEN 600 MG PO TABS: 600.0000 mg | ORAL_TABLET | Freq: Four times a day (QID) | ORAL | Status: DC | PRN

## 2012-09-15 MED ORDER — SODIUM CHLORIDE 0.9 % IJ SOLN
INTRAMUSCULAR | Status: DC | PRN
  Administered 2012-09-15: 10 mL

## 2012-09-15 MED ORDER — BUPIVACAINE HCL (PF) 0.5 % IJ SOLN
INTRAMUSCULAR | Status: AC
  Filled 2012-09-15: qty 30

## 2012-09-15 MED ORDER — MORPHINE SULFATE (PF) 1 MG/ML IV SOLN
INTRAVENOUS | Status: DC
  Administered 2012-09-15: 11:00:00 via INTRAVENOUS
  Filled 2012-09-15: qty 25

## 2012-09-15 MED ORDER — KETOROLAC TROMETHAMINE 30 MG/ML IJ SOLN
30.0000 mg | Freq: Four times a day (QID) | INTRAMUSCULAR | Status: DC | PRN
  Administered 2012-09-15 (×2): 30 mg via INTRAVENOUS
  Filled 2012-09-15 (×2): qty 1

## 2012-09-15 MED ORDER — LACTATED RINGERS IV SOLN
INTRAVENOUS | Status: DC
  Administered 2012-09-15 – 2012-09-16 (×3): via INTRAVENOUS

## 2012-09-15 MED ORDER — FENTANYL CITRATE 0.05 MG/ML IJ SOLN
INTRAMUSCULAR | Status: AC
  Administered 2012-09-15: 50 ug via INTRAVENOUS
  Filled 2012-09-15: qty 2

## 2012-09-15 MED ORDER — PHENYLEPHRINE 40 MCG/ML (10ML) SYRINGE FOR IV PUSH (FOR BLOOD PRESSURE SUPPORT)
PREFILLED_SYRINGE | INTRAVENOUS | Status: AC
  Filled 2012-09-15: qty 5

## 2012-09-15 MED ORDER — LACTATED RINGERS IV SOLN
INTRAVENOUS | Status: DC
  Administered 2012-09-15 (×2): via INTRAVENOUS

## 2012-09-15 MED ORDER — MIDAZOLAM HCL 5 MG/5ML IJ SOLN
INTRAMUSCULAR | Status: DC | PRN
  Administered 2012-09-15: 2 mg via INTRAVENOUS

## 2012-09-15 MED ORDER — ALBUTEROL SULFATE HFA 108 (90 BASE) MCG/ACT IN AERS
INHALATION_SPRAY | RESPIRATORY_TRACT | Status: AC
  Filled 2012-09-15: qty 6.7

## 2012-09-15 MED ORDER — ROCURONIUM BROMIDE 50 MG/5ML IV SOLN
INTRAVENOUS | Status: AC
  Filled 2012-09-15: qty 1

## 2012-09-15 MED ORDER — NALOXONE HCL 0.4 MG/ML IJ SOLN: 0.4000 mg | INTRAMUSCULAR | Status: DC | PRN

## 2012-09-15 MED ORDER — KETOROLAC TROMETHAMINE 30 MG/ML IJ SOLN
INTRAMUSCULAR | Status: AC
  Filled 2012-09-15: qty 1

## 2012-09-15 MED ORDER — ZOLPIDEM TARTRATE 5 MG PO TABS: 5.0000 mg | ORAL_TABLET | Freq: Every evening | ORAL | Status: DC | PRN

## 2012-09-15 MED ORDER — DIPHENHYDRAMINE HCL 50 MG/ML IJ SOLN: 12.5000 mg | Freq: Four times a day (QID) | INTRAMUSCULAR | Status: DC | PRN

## 2012-09-15 MED ORDER — LIDOCAINE HCL (CARDIAC) 20 MG/ML IV SOLN
INTRAVENOUS | Status: DC | PRN
  Administered 2012-09-15: 80 mg via INTRAVENOUS

## 2012-09-15 MED ORDER — KETOROLAC TROMETHAMINE 30 MG/ML IJ SOLN: 15.0000 mg | Freq: Once | INTRAMUSCULAR | Status: DC | PRN

## 2012-09-15 MED ORDER — ALBUTEROL SULFATE HFA 108 (90 BASE) MCG/ACT IN AERS
INHALATION_SPRAY | RESPIRATORY_TRACT | Status: DC | PRN
  Administered 2012-09-15: 2 via RESPIRATORY_TRACT

## 2012-09-15 MED ORDER — ONDANSETRON HCL 4 MG PO TABS: 4.0000 mg | ORAL_TABLET | Freq: Four times a day (QID) | ORAL | Status: DC | PRN

## 2012-09-15 MED ORDER — FENTANYL CITRATE 0.05 MG/ML IJ SOLN
25.0000 ug | INTRAMUSCULAR | Status: DC | PRN
  Administered 2012-09-15: 50 ug via INTRAVENOUS

## 2012-09-15 MED ORDER — 0.9 % SODIUM CHLORIDE (POUR BTL) OPTIME
TOPICAL | Status: DC | PRN
  Administered 2012-09-15: 1000 mL

## 2012-09-15 MED ORDER — ACETAMINOPHEN 10 MG/ML IV SOLN: 1000.0000 mg | Freq: Once | INTRAVENOUS | Status: DC

## 2012-09-15 MED ORDER — FENTANYL CITRATE 0.05 MG/ML IJ SOLN
INTRAMUSCULAR | Status: DC | PRN
  Administered 2012-09-15 (×3): 100 ug via INTRAVENOUS
  Administered 2012-09-15 (×4): 50 ug via INTRAVENOUS

## 2012-09-15 MED ORDER — DOCUSATE SODIUM 100 MG PO CAPS
100.0000 mg | ORAL_CAPSULE | Freq: Two times a day (BID) | ORAL | Status: DC
  Administered 2012-09-15 – 2012-09-16 (×2): 100 mg via ORAL
  Filled 2012-09-15 (×2): qty 1

## 2012-09-15 MED ORDER — HYDROCODONE-ACETAMINOPHEN 5-325 MG PO TABS
1.0000 | ORAL_TABLET | ORAL | Status: DC | PRN
  Administered 2012-09-16: 2 via ORAL
  Administered 2012-09-16: 1 via ORAL
  Filled 2012-09-15: qty 1
  Filled 2012-09-15: qty 2

## 2012-09-15 MED ORDER — ONDANSETRON HCL 4 MG/2ML IJ SOLN: 4.0000 mg | Freq: Four times a day (QID) | INTRAMUSCULAR | Status: DC | PRN

## 2012-09-15 MED ORDER — ALUM & MAG HYDROXIDE-SIMETH 200-200-20 MG/5ML PO SUSP: 30.0000 mL | ORAL | Status: DC | PRN

## 2012-09-15 MED ORDER — FENTANYL CITRATE 0.05 MG/ML IJ SOLN
INTRAMUSCULAR | Status: AC
  Filled 2012-09-15: qty 5

## 2012-09-15 MED ORDER — PROPOFOL 10 MG/ML IV EMUL
INTRAVENOUS | Status: AC
  Filled 2012-09-15: qty 20

## 2012-09-15 MED ORDER — LACTATED RINGERS IV BOLUS (SEPSIS)
500.0000 mL | Freq: Once | INTRAVENOUS | Status: AC
  Administered 2012-09-15: 500 mL via INTRAVENOUS

## 2012-09-15 MED ORDER — STERILE WATER FOR IRRIGATION IR SOLN
Status: DC | PRN
  Administered 2012-09-15: 1000 mL via INTRAVESICAL

## 2012-09-15 MED ORDER — GLYCOPYRROLATE 0.2 MG/ML IJ SOLN
INTRAMUSCULAR | Status: DC | PRN
  Administered 2012-09-15: 0.4 mg via INTRAVENOUS

## 2012-09-15 MED ORDER — KETOROLAC TROMETHAMINE 30 MG/ML IJ SOLN
INTRAMUSCULAR | Status: DC | PRN
  Administered 2012-09-15: 30 mg via INTRAVENOUS

## 2012-09-15 MED ORDER — ONDANSETRON HCL 4 MG/2ML IJ SOLN
INTRAMUSCULAR | Status: AC
  Filled 2012-09-15: qty 2

## 2012-09-15 MED ORDER — ROCURONIUM BROMIDE 100 MG/10ML IV SOLN
INTRAVENOUS | Status: DC | PRN
  Administered 2012-09-15 (×2): 10 mg via INTRAVENOUS
  Administered 2012-09-15: 40 mg via INTRAVENOUS

## 2012-09-15 MED ORDER — DIPHENHYDRAMINE HCL 12.5 MG/5ML PO ELIX
12.5000 mg | ORAL_SOLUTION | Freq: Four times a day (QID) | ORAL | Status: DC | PRN
  Administered 2012-09-16: 12.5 mg via ORAL
  Filled 2012-09-15: qty 5

## 2012-09-15 MED ORDER — MORPHINE SULFATE 10 MG/ML IJ SOLN
INTRAMUSCULAR | Status: DC | PRN
  Administered 2012-09-15: 10 mg via INTRAVENOUS

## 2012-09-15 MED ORDER — SODIUM CHLORIDE 0.9 % IJ SOLN: 9.0000 mL | INTRAMUSCULAR | Status: DC | PRN

## 2012-09-15 MED ORDER — ONDANSETRON HCL 4 MG/2ML IJ SOLN
INTRAMUSCULAR | Status: DC | PRN
  Administered 2012-09-15: 4 mg via INTRAVENOUS

## 2012-09-15 MED ORDER — INDIGOTINDISULFONATE SODIUM 8 MG/ML IJ SOLN
INTRAMUSCULAR | Status: AC
  Filled 2012-09-15: qty 5

## 2012-09-15 MED ORDER — ATROPINE SULFATE 0.4 MG/ML IJ SOLN
INTRAMUSCULAR | Status: AC
  Filled 2012-09-15: qty 1

## 2012-09-15 MED ORDER — MENTHOL 3 MG MT LOZG
1.0000 | LOZENGE | OROMUCOSAL | Status: DC | PRN
  Administered 2012-09-16: 3 mg via ORAL
  Filled 2012-09-15: qty 9

## 2012-09-15 MED ORDER — NEOSTIGMINE METHYLSULFATE 1 MG/ML IJ SOLN
INTRAMUSCULAR | Status: DC | PRN
  Administered 2012-09-15: 3 mg via INTRAVENOUS

## 2012-09-15 MED ORDER — MIDAZOLAM HCL 2 MG/2ML IJ SOLN
INTRAMUSCULAR | Status: AC
  Filled 2012-09-15: qty 2

## 2012-09-15 MED ORDER — HYDROMORPHONE 0.3 MG/ML IV SOLN
INTRAVENOUS | Status: DC
  Administered 2012-09-15: 2.19 mg via INTRAVENOUS
  Administered 2012-09-15: 3.99 mg via INTRAVENOUS
  Administered 2012-09-15: 13:00:00 via INTRAVENOUS
  Administered 2012-09-16: 0.2 mg via INTRAVENOUS
  Filled 2012-09-15: qty 25

## 2012-09-15 MED ORDER — ACETAMINOPHEN 10 MG/ML IV SOLN
INTRAVENOUS | Status: AC
  Administered 2012-09-15: 1000 mg via INTRAVENOUS
  Filled 2012-09-15: qty 100

## 2012-09-15 MED ORDER — CEFAZOLIN SODIUM 1-5 GM-% IV SOLN
1.0000 g | Freq: Four times a day (QID) | INTRAVENOUS | Status: AC
  Filled 2012-09-15: qty 50

## 2012-09-15 MED ORDER — EPHEDRINE SULFATE 50 MG/ML IJ SOLN
INTRAMUSCULAR | Status: DC | PRN
  Administered 2012-09-15: 10 mg via INTRAVENOUS

## 2012-09-15 MED ORDER — CITALOPRAM HYDROBROMIDE 20 MG PO TABS
20.0000 mg | ORAL_TABLET | Freq: Every day | ORAL | Status: DC
  Filled 2012-09-15 (×2): qty 1

## 2012-09-15 MED ORDER — LACTATED RINGERS IR SOLN
Status: DC | PRN
  Administered 2012-09-15: 3000 mL

## 2012-09-15 MED ORDER — DIPHENHYDRAMINE HCL 12.5 MG/5ML PO ELIX
12.5000 mg | ORAL_SOLUTION | Freq: Four times a day (QID) | ORAL | Status: DC | PRN
  Filled 2012-09-15: qty 5

## 2012-09-15 MED ORDER — INDIGOTINDISULFONATE SODIUM 8 MG/ML IJ SOLN
INTRAMUSCULAR | Status: DC | PRN
  Administered 2012-09-15: 3 mL via INTRAVENOUS

## 2012-09-15 MED ORDER — NEOSTIGMINE METHYLSULFATE 1 MG/ML IJ SOLN
INTRAMUSCULAR | Status: AC
  Filled 2012-09-15: qty 1

## 2012-09-15 MED ORDER — EPHEDRINE 5 MG/ML INJ
INTRAVENOUS | Status: AC
  Filled 2012-09-15: qty 10

## 2012-09-15 SURGICAL SUPPLY — 42 items
ADH SKN CLS APL DERMABOND .7 (GAUZE/BANDAGES/DRESSINGS) ×3
CABLE HIGH FREQUENCY MONO STRZ (ELECTRODE) IMPLANT
CATH ROBINSON RED A/P 16FR (CATHETERS) IMPLANT
CLOTH BEACON ORANGE TIMEOUT ST (SAFETY) ×4 IMPLANT
CONT PATH 16OZ SNAP LID 3702 (MISCELLANEOUS) ×4 IMPLANT
COVER TABLE BACK 60X90 (DRAPES) ×4 IMPLANT
DECANTER SPIKE VIAL GLASS SM (MISCELLANEOUS) IMPLANT
DERMABOND ADVANCED (GAUZE/BANDAGES/DRESSINGS) ×1
DERMABOND ADVANCED .7 DNX12 (GAUZE/BANDAGES/DRESSINGS) ×3 IMPLANT
ELECT LIGASURE LONG (ELECTRODE) IMPLANT
ELECT LIGASURE SHORT 9 REUSE (ELECTRODE) IMPLANT
ELECT REM PT RETURN 9FT ADLT (ELECTROSURGICAL)
ELECTRODE REM PT RTRN 9FT ADLT (ELECTROSURGICAL) IMPLANT
FILTER SMOKE EVAC LAPAROSHD (FILTER) ×4 IMPLANT
GLOVE BIO SURGEON STRL SZ8 (GLOVE) ×12 IMPLANT
GLOVE BIOGEL PI IND STRL 6.5 (GLOVE) ×3 IMPLANT
GLOVE BIOGEL PI INDICATOR 6.5 (GLOVE) ×1
GOWN STRL REIN XL XLG (GOWN DISPOSABLE) ×16 IMPLANT
NEEDLE INSUFFLATION 120MM (ENDOMECHANICALS) ×4 IMPLANT
NS IRRIG 1000ML POUR BTL (IV SOLUTION) ×4 IMPLANT
PACK LAVH (CUSTOM PROCEDURE TRAY) ×4 IMPLANT
PROTECTOR NERVE ULNAR (MISCELLANEOUS) ×4 IMPLANT
SCISSORS LAP 5X45 EPIX DISP (ENDOMECHANICALS) IMPLANT
SEALER TISSUE G2 CVD JAW 45CM (ENDOMECHANICALS) ×4 IMPLANT
SET CYSTO W/LG BORE CLAMP LF (SET/KITS/TRAYS/PACK) ×3 IMPLANT
SET IRRIG TUBING LAPAROSCOPIC (IRRIGATION / IRRIGATOR) ×3 IMPLANT
STRIP CLOSURE SKIN 1/4X3 (GAUZE/BANDAGES/DRESSINGS) IMPLANT
SUT MNCRL 0 MO-4 VIOLET 18 CR (SUTURE) ×9 IMPLANT
SUT MNCRL 0 VIOLET 6X18 (SUTURE) ×3 IMPLANT
SUT MNCRL AB 0 CT1 27 (SUTURE) IMPLANT
SUT MONOCRYL 0 6X18 (SUTURE) ×1
SUT MONOCRYL 0 MO 4 18  CR/8 (SUTURE) ×3
SUT VIC AB 4-0 PS2 27 (SUTURE) ×4 IMPLANT
SUT VICRYL 0 UR6 27IN ABS (SUTURE) IMPLANT
SYR 30ML LL (SYRINGE) ×4 IMPLANT
SYR 5ML LL (SYRINGE) ×4 IMPLANT
TOWEL OR 17X24 6PK STRL BLUE (TOWEL DISPOSABLE) ×8 IMPLANT
TRAY FOLEY CATH 14FR (SET/KITS/TRAYS/PACK) ×4 IMPLANT
TROCAR XCEL NON-BLD 11X100MML (ENDOMECHANICALS) ×4 IMPLANT
TROCAR XCEL NON-BLD 5MMX100MML (ENDOMECHANICALS) ×4 IMPLANT
WARMER LAPAROSCOPE (MISCELLANEOUS) ×4 IMPLANT
WATER STERILE IRR 1000ML POUR (IV SOLUTION) ×4 IMPLANT

## 2012-09-15 NOTE — Progress Notes (Signed)
Reviewed procedure with patient No changes to H&P per patient history.

## 2012-09-15 NOTE — Anesthesia Preprocedure Evaluation (Signed)
Anesthesia Evaluation  Patient identified by MRN, date of birth, ID band Patient awake    Reviewed: Allergy & Precautions, H&P , NPO status , Patient's Chart, lab work & pertinent test results, reviewed documented beta blocker date and time   Airway Mallampati: II TM Distance: >3 FB Neck ROM: full    Dental  (+) Teeth Intact   Pulmonary asthma (doesn't use inhaler except with bronchitis, last used 2.5 months ago) , Current Smoker,  breath sounds clear to auscultation  Pulmonary exam normal       Cardiovascular negative cardio ROS  Rhythm:regular Rate:Normal     Neuro/Psych PSYCHIATRIC DISORDERS (anxiety, depression, panic attacks (having a panic attack in short stay)) negative neurological ROS     GI/Hepatic negative GI ROS, Neg liver ROS,   Endo/Other  Obese - BMI 35  Renal/GU negative Renal ROS  Female genitourinary complaint: endometriosis, pelvic pain - takes vicodin daily during menstrual cycle.     Musculoskeletal   Abdominal   Peds  Hematology negative hematology ROS (+)   Anesthesia Other Findings   Reproductive/Obstetrics negative OB ROS                           Anesthesia Physical Anesthesia Plan  ASA: II  Anesthesia Plan: General ETT   Post-op Pain Management:    Induction:   Airway Management Planned:   Additional Equipment:   Intra-op Plan:   Post-operative Plan:   Informed Consent: I have reviewed the patients History and Physical, chart, labs and discussed the procedure including the risks, benefits and alternatives for the proposed anesthesia with the patient or authorized representative who has indicated his/her understanding and acceptance.   Dental Advisory Given  Plan Discussed with: CRNA and Surgeon  Anesthesia Plan Comments:         Anesthesia Quick Evaluation

## 2012-09-15 NOTE — Transfer of Care (Signed)
Immediate Anesthesia Transfer of Care Note  Patient: [name]  Procedure(s) Performed: Procedure(s): LAPAROSCOPIC ASSISTED VAGINAL HYSTERECTOMY (N/A) CYSTOSCOPY (N/A)  Patient Location: PACU  Anesthesia Type:General  Level of Consciousness: awake, alert , oriented and patient cooperative  Airway & Oxygen Therapy: Patient Spontanous Breathing and Patient connected to nasal cannula oxygen  Post-op Assessment: Report given to PACU RN, Post -op Vital signs reviewed and stable and Patient moving all extremities X 4  Post vital signs: Reviewed and stable  Complications: No apparent anesthesia complications

## 2012-09-15 NOTE — Op Note (Signed)
[name], HOSMER NO.:  [id number]  MEDICAL RECORD NO.:  [id number]  LOCATION:  9316                          FACILITY:  [facility]  PHYSICIAN:  [name]. [name], [name] DATE OF BIRTH:  01-Feb-1986  DATE OF PROCEDURE:  09/15/2012 DATE OF DISCHARGE:                              OPERATIVE REPORT   PREOPERATIVE DIAGNOSIS:  Endometriosis.  POSTOPERATIVE DIAGNOSIS:  Endometriosis.  PROCEDURE: 1. Laparoscopically assisted vaginal hysterectomy. 2. Cystoscopy.  SURGEON:  [name]. [name], [name]  ASSISTANT:  Zelphia Cairo, MD  ANESTHESIA:  General with endotracheal intubation.  SPECIMENS:  Uterus to pathology.  ESTIMATED BLOOD LOSS:  200 mL.  INDICATIONS AND CONSENT:  This patient is 27 year old, G2, P1 with endometriosis and severe dysmenorrhea and premenstrual pain, refractory to medical management.  After discussion of options, she is admitted for laparoscopically assisted vaginal hysterectomy.  Potential risks and complications were discussed with the patient preoperatively including but not limited to, infection, organ damage, bleeding requiring transfusion of blood products with HIV and hepatitis acquisition, DVT, PE, pneumonia, fistula formation, pelvic pain, abdominal pain, pain with intercourse,  persistent or recurrent pelvic pain.  All questions had been answered and consent was signed on the chart.  FINDINGS:  Upper abdomen is grossly normal.  Uterus is 6 weeks in size, smooth in contour.  Anterior cul-de-sac and posterior cul-de-sac were clean.  Tubes and ovaries were normal bilaterally.  PROCEDURE IN DETAIL:  The patient was taken to operating room, where she was identified, placed in dorsal supine position.  General anesthesia was induced via endotracheal intubation.  She was then placed in dorsal lithotomy position.  Time-out was undertaken.  She was prepped abdominally and vaginally, bladder straight catheterized.  Hulka tenaculum was placed.   The uterus was manipulated.  She was draped in a sterile fashion.  Time-out was undertaken.  The infraumbilical and suprapubic areas were injected midline with approximately 6 mL of 0.5% plain Marcaine.  Small infraumbilical incision was made and a disposable Veress needle was placed without difficulty.  Good syringe and drop test were noted, 2 L of gas was then insufflated under low pressure with good tympany in the right upper quadrant.  A 10-11 XL bladeless disposable trocar sleeve was then placed using direct visualization with the diagnostic laparoscope.  After placement, the operative laparoscope was used.  Small suprapubic incision was made and a 5-mm XL disposable trocar sleeve was placed under direct visualization without difficulty. The above findings were noted.  Then, using the EnSeal bipolar cautery cutting instrument the proximal ligaments were taken down at the level of vesicouterine peritoneum bilaterally.  Vesicouterine peritoneum was taken down cephalad laterally.  Good hemostasis was noted.  Suprapubic trocar sleeve was removed.  Instruments were removed.  Attention was turned to the vagina.  Posterior cul-de-sac was entered sharply.  Cervix was circumscribed with unipolar cautery.  The mucosa was advanced sharply and bluntly.  Progressive bites of the handheld bipolar cautery was used.  Anterior cul-de-sacs entered without difficulty.  After taking down the uterine vessels.  The fundus was delivered posteriorly. Proximal ligaments were taken down and the specimen was delivered. Uterosacral ligaments were then plicated in the vaginal cuff with separate  sutures, then plicated in the midline with a third suture of 0 Monocryl.  All suture be 0 Monocryl unless otherwise designated.  Cuff was closed with figure-of-eights.  Foley catheter was placed in the bladder.  No urine return was noted.  Therefore, the Foley catheter was removed.  The patient was given indigo carmine.   Cystoscopy was carried out.  A 360-degree inspection reveals no foreign bodies and no evidence of perforation.  A good puff of indigo carmine was noted from the ureters bilaterally.  Cystoscope was removed.  Foley catheter was placed.  Attention was returned to the abdomen.  Pneumoperitoneum was reintroduced and the suprapubic trocar sleeve was reintroduced under direct visualization.  Minor bleeding at peritoneal edges controlled with bipolar cautery.  Inspection under reduced pneumoperitoneum revealed continued hemostasis.  The remaining approximately 24 mL of 0.5% plain Marcaine was instilled into peritoneal cavity.  Instruments were removed.  Trocar sleeve was removed.  Pneumoperitoneum was reduced. The umbilical trocar sleeve was removed.  The umbilical incision was closed with interrupted 3-0 Vicryl suture.  Dermabond was placed on both incisions.  All counts were correct.  The patient was awakened, taken to recovery room in stable condition.     [name] [name], [name]     JET/MEDQ  D:  09/15/2012  T:  09/15/2012  Job:  [id number]

## 2012-09-15 NOTE — Anesthesia Postprocedure Evaluation (Signed)
  Anesthesia Post-op Note  Patient: [name]  Procedure(s) Performed: Procedure(s): LAPAROSCOPIC ASSISTED VAGINAL HYSTERECTOMY (N/A) CYSTOSCOPY (N/A) Patient is awake and responsive. Pain and nausea are reasonably well controlled. Vital signs are stable and clinically acceptable. Oxygen saturation is clinically acceptable. There are no apparent anesthetic complications at this time. Patient is ready for discharge.

## 2012-09-15 NOTE — Progress Notes (Signed)
Last time of positive MRSA was when patient was 27 yrs old.  Patient is now 46.

## 2012-09-15 NOTE — Anesthesia Postprocedure Evaluation (Signed)
  Anesthesia Post-op Note  Patient: [name]  Procedure(s) Performed: Procedure(s): LAPAROSCOPIC ASSISTED VAGINAL HYSTERECTOMY (N/A) CYSTOSCOPY (N/A)  Patient Location: [facility]  Anesthesia Type:General  Level of Consciousness: awake, alert  and oriented  Airway and Oxygen Therapy: Patient Spontanous Breathing and Patient connected to nasal cannula oxygen  Post-op Pain: none  Post-op Assessment: Post-op Vital signs reviewed and Patient's Cardiovascular Status Stable  Post-op Vital Signs: Reviewed and stable  Complications: No apparent anesthesia complications

## 2012-09-15 NOTE — Progress Notes (Signed)
Feels much better on PCA Dilaudid Ambulating well, tolerating regular diet  Blood pressure 102/59, pulse 76, temperature 97.9 F (36.6 C), temperature source Oral, resp. rate 16, height 5\' 6"  (1.676 m), weight 220 lb (99.791 kg), SpO2 97.00%. UO clear, better after IV fluid bolus  Lungs CTA Corr RRR Abd soft, good BS, incisions OK Ext NT without cords  A: Stable  P: Toradol prn

## 2012-09-15 NOTE — Brief Op Note (Signed)
09/15/2012  9:13 AM  PATIENT:  [name]  27 y.o. female  PRE-OPERATIVE DIAGNOSIS:  endometriosis cpt 58550  POST-OPERATIVE DIAGNOSIS:  endometriosis  PROCEDURE:  Procedure(s): LAPAROSCOPIC ASSISTED VAGINAL HYSTERECTOMY (N/A) CYSTOSCOPY (N/A)  SURGEON:  Surgeon(s) and Role:    * Leslie Andrea, MD - Primary    * Zelphia Cairo, MD - Assisting  PHYSICIAN ASSISTANT:   ASSISTANTS: Adkins   ANESTHESIA:   general  EBL:  Total I/O In: 1000 [I.V.:1000] Out: 200 [Blood:200]  BLOOD ADMINISTERED:none  DRAINS: Urinary Catheter (Foley)   LOCAL MEDICATIONS USED:  MARCAINE     SPECIMEN:  Source of Specimen:  uterus  DISPOSITION OF SPECIMEN:  PATHOLOGY  COUNTS:  YES  TOURNIQUET:  * No tourniquets in log *  DICTATION: .Other Dictation: Dictation Number [id number]  PLAN OF CARE: Admit for overnight observation  PATIENT DISPOSITION:  PACU - hemodynamically stable.   Delay start of Pharmacological VTE agent (>24hrs) due to surgical blood loss or risk of bleeding: not applicable

## 2012-09-16 DIAGNOSIS — N809 Endometriosis, unspecified: Secondary | ICD-10-CM | POA: Diagnosis present

## 2012-09-16 HISTORY — DX: Endometriosis, unspecified: N80.9

## 2012-09-16 LAB — CBC
HCT: 33.4 % — ABNORMAL LOW (ref 36.0–46.0)
Hemoglobin: 11.2 g/dL — ABNORMAL LOW (ref 12.0–15.0)
MCH: 31.7 pg (ref 26.0–34.0)
MCHC: 33.5 g/dL (ref 30.0–36.0)
MCV: 94.6 fL (ref 78.0–100.0)
Platelets: 146 10*3/uL — ABNORMAL LOW (ref 150–400)
RBC: 3.53 MIL/uL — ABNORMAL LOW (ref 3.87–5.11)
RDW: 12.6 % (ref 11.5–15.5)
WBC: 5.8 10*3/uL (ref 4.0–10.5)

## 2012-09-16 MED ORDER — HYDROCODONE-ACETAMINOPHEN 5-325 MG PO TABS: 1.0000 | ORAL_TABLET | Freq: Four times a day (QID) | ORAL | Status: DC | PRN

## 2012-09-16 MED FILL — Heparin Sodium (Porcine) Inj 5000 Unit/ML: INTRAMUSCULAR | Qty: 1 | Status: AC

## 2012-09-16 NOTE — Progress Notes (Signed)
Wants to go home Tolerating regular diet, passing flatus, ambulating, voiding, good pain relief C/O frontal HA relieved with Toradol, has had HA in past, no vision changes  Blood pressure 96/54, pulse 77, temperature 98.2 F (36.8 C), temperature source Oral, resp. rate 17, height 5\' 6"  (1.676 m), weight 220 lb (99.791 kg), SpO2 96.00%.  Abdomen soft, good BS Incisions OK  A: Staisfactory  P: D/C home     Instructions given

## 2012-09-16 NOTE — Progress Notes (Signed)
Pt refuses PCA meds and requests it be turned off at this time.

## 2012-09-16 NOTE — Discharge Summary (Signed)
Physician Discharge Summary  Patient ID: [name] MRN: [id number] DOB/AGE: 27-Apr-1986 27 y.o.  Admit date: 09/15/2012 Discharge date: 09/16/2012  Admission Diagnoses:endometriosis  Discharge Diagnoses:  Principal Problem:   Endometriosis   Discharged Condition: good  Hospital Course: admitted for surgery. Good recovery of bowel function, tolerating regular diet, passing flatus, voiding and ambulating.  Consults: None  Significant Diagnostic Studies: labs:  Results for orders placed during the hospital encounter of 09/15/12 (from the past 24 hour(s))  CBC     Status: Abnormal   Collection Time    09/15/12 12:25 PM      Result Value Range   WBC 8.8  4.0 - 10.5 K/uL   RBC 3.83 (*) 3.87 - 5.11 MIL/uL   Hemoglobin 12.0  12.0 - 15.0 g/dL   HCT 40.9 (*) 81.1 - 91.4 %   MCV 92.7  78.0 - 100.0 fL   MCH 31.3  26.0 - 34.0 pg   MCHC 33.8  30.0 - 36.0 g/dL   RDW 78.2  95.6 - 21.3 %   Platelets 171  150 - 400 K/uL  CBC     Status: Abnormal   Collection Time    09/16/12  6:00 AM      Result Value Range   WBC 5.8  4.0 - 10.5 K/uL   RBC 3.53 (*) 3.87 - 5.11 MIL/uL   Hemoglobin 11.2 (*) 12.0 - 15.0 g/dL   HCT 08.6 (*) 57.8 - 46.9 %   MCV 94.6  78.0 - 100.0 fL   MCH 31.7  26.0 - 34.0 pg   MCHC 33.5  30.0 - 36.0 g/dL   RDW 62.9  52.8 - 41.3 %   Platelets 146 (*) 150 - 400 K/uL    Treatments: IV hydration, antibiotics: Ancef and surgery: LAVH/cystoscopy  Discharge Exam: Blood pressure 96/54, pulse 77, temperature 98.2 F (36.8 C), temperature source Oral, resp. rate 17, height 5\' 6"  (1.676 m), weight 220 lb (99.791 kg), SpO2 96.00%. General appearance: alert, cooperative and no distress GI: soft, non-tender; bowel sounds normal; no masses,  no organomegaly  Disposition: 01-Home or Self Care     Medication List    TAKE these medications       acetaminophen 325 MG tablet  Commonly known as:  TYLENOL  Take 650 mg by mouth every 6 (six) hours as needed for pain.     ALLEGRA ALLERGY 180 MG tablet  Generic drug:  fexofenadine  Take 180 mg by mouth daily.     citalopram 40 MG tablet  Commonly known as:  CELEXA  Take 20 mg by mouth daily.     HYDROcodone-acetaminophen 5-325 MG per tablet  Commonly known as:  NORCO  Take 2 tablets by mouth every 6 (six) hours as needed for pain.     HYDROcodone-acetaminophen 5-325 MG per tablet  Commonly known as:  NORCO/VICODIN  Take 1-2 tablets by mouth every 6 (six) hours as needed.     ibuprofen 200 MG tablet  Commonly known as:  ADVIL,MOTRIN  Take 600 mg by mouth every 6 (six) hours as needed for pain.           Follow-up Information   Follow up In 2 weeks.      Signed: Ruari Mudgett II,Sadae Arrazola E 09/16/2012, 8:45 AM

## 2012-09-17 ENCOUNTER — Encounter (HOSPITAL_COMMUNITY): Payer: Self-pay | Admitting: Obstetrics and Gynecology

## 2012-09-20 ENCOUNTER — Encounter (HOSPITAL_COMMUNITY): Payer: Self-pay | Admitting: *Deleted

## 2012-09-20 ENCOUNTER — Inpatient Hospital Stay (HOSPITAL_COMMUNITY)
Admission: AD | Admit: 2012-09-20 | Discharge: 2012-09-20 | Disposition: A | Discharge status: Home / Self Care | Payer: Medicaid Other | Source: Ambulatory Visit | Attending: Obstetrics and Gynecology | Admitting: Obstetrics and Gynecology

## 2012-09-20 DIAGNOSIS — G8918 Other acute postprocedural pain: Secondary | ICD-10-CM | POA: Insufficient documentation

## 2012-09-20 DIAGNOSIS — N949 Unspecified condition associated with female genital organs and menstrual cycle: Secondary | ICD-10-CM | POA: Insufficient documentation

## 2012-09-20 LAB — URINALYSIS, ROUTINE W REFLEX MICROSCOPIC
Bilirubin Urine: NEGATIVE
Glucose, UA: NEGATIVE mg/dL
Ketones, ur: NEGATIVE mg/dL
Leukocytes, UA: NEGATIVE
Nitrite: NEGATIVE
Protein, ur: NEGATIVE mg/dL
Specific Gravity, Urine: 1.015 (ref 1.005–1.030)
Urobilinogen, UA: 0.2 mg/dL (ref 0.0–1.0)
pH: 5.5 (ref 5.0–8.0)

## 2012-09-20 LAB — URINE MICROSCOPIC-ADD ON

## 2012-09-20 MED ORDER — KETOROLAC TROMETHAMINE 60 MG/2ML IM SOLN
60.0000 mg | Freq: Once | INTRAMUSCULAR | Status: AC
  Administered 2012-09-20: 60 mg via INTRAMUSCULAR
  Filled 2012-09-20: qty 2
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
## 2012-09-20 NOTE — MAU Note (Signed)
Pt states that she had a Vaginal Hyst. On  March 18th-sttes she started having a new pain in her supra pubic area and it goes into her rectum

## 2012-09-20 NOTE — Progress Notes (Signed)
Some relief with Toradol  Blood pressure 114/79, pulse 89, temperature 98.4 F (36.9 C), temperature source Oral, resp. rate 20, height 5\' 7"  (1.702 m), weight 224 lb 8 oz (101.833 kg), last menstrual period 08/18/2012, SpO2 100.00%.  Results for orders placed during the hospital encounter of 09/20/12 (from the past 24 hour(s))  URINALYSIS, ROUTINE W REFLEX MICROSCOPIC     Status: Abnormal   Collection Time    09/20/12  9:30 PM      Result Value Range   Color, Urine YELLOW  YELLOW   APPearance CLEAR  CLEAR   Specific Gravity, Urine 1.015  1.005 - 1.030   pH 5.5  5.0 - 8.0   Glucose, UA NEGATIVE  NEGATIVE mg/dL   Hgb urine dipstick LARGE (*) NEGATIVE   Bilirubin Urine NEGATIVE  NEGATIVE   Ketones, ur NEGATIVE  NEGATIVE mg/dL   Protein, ur NEGATIVE  NEGATIVE mg/dL   Urobilinogen, UA 0.2  0.0 - 1.0 mg/dL   Nitrite NEGATIVE  NEGATIVE   Leukocytes, UA NEGATIVE  NEGATIVE  URINE MICROSCOPIC-ADD ON     Status: Abnormal   Collection Time    09/20/12  9:30 PM      Result Value Range   Squamous Epithelial / LPF FEW (*) RARE   WBC, UA 0-2  <3 WBC/hpf   RBC / HPF 7-10  <3 RBC/hpf   Bacteria, UA FEW (*) RARE  offered more pain meds-patient declines A: Pelvic floor spasm  P: D/C home      Robaxin 500mg , #30, 1 po q6hrs prn-D/W patient dosing of meds      Will call if sxs get worse       FU office next week as scheduled

## 2014-02-26 ENCOUNTER — Emergency Department (HOSPITAL_COMMUNITY): Payer: BC Managed Care – PPO

## 2014-02-26 ENCOUNTER — Emergency Department (HOSPITAL_COMMUNITY)
Admission: EM | Admit: 2014-02-26 | Discharge: 2014-02-26 | Disposition: A | Discharge status: Home / Self Care | Payer: BC Managed Care – PPO | Attending: Emergency Medicine | Admitting: Emergency Medicine

## 2014-02-26 ENCOUNTER — Encounter (HOSPITAL_COMMUNITY): Payer: Self-pay | Admitting: Emergency Medicine

## 2014-02-26 DIAGNOSIS — J45901 Unspecified asthma with (acute) exacerbation: Secondary | ICD-10-CM | POA: Insufficient documentation

## 2014-02-26 DIAGNOSIS — R059 Cough, unspecified: Secondary | ICD-10-CM | POA: Diagnosis present

## 2014-02-26 DIAGNOSIS — R05 Cough: Secondary | ICD-10-CM | POA: Diagnosis present

## 2014-02-26 DIAGNOSIS — Z8614 Personal history of Methicillin resistant Staphylococcus aureus infection: Secondary | ICD-10-CM | POA: Diagnosis not present

## 2014-02-26 DIAGNOSIS — F3289 Other specified depressive episodes: Secondary | ICD-10-CM | POA: Insufficient documentation

## 2014-02-26 DIAGNOSIS — J40 Bronchitis, not specified as acute or chronic: Secondary | ICD-10-CM

## 2014-02-26 DIAGNOSIS — F17200 Nicotine dependence, unspecified, uncomplicated: Secondary | ICD-10-CM | POA: Diagnosis not present

## 2014-02-26 DIAGNOSIS — Z79899 Other long term (current) drug therapy: Secondary | ICD-10-CM | POA: Diagnosis not present

## 2014-02-26 DIAGNOSIS — Z791 Long term (current) use of non-steroidal anti-inflammatories (NSAID): Secondary | ICD-10-CM | POA: Insufficient documentation

## 2014-02-26 DIAGNOSIS — F411 Generalized anxiety disorder: Secondary | ICD-10-CM | POA: Insufficient documentation

## 2014-02-26 DIAGNOSIS — F329 Major depressive disorder, single episode, unspecified: Secondary | ICD-10-CM | POA: Insufficient documentation

## 2014-02-26 MED ORDER — PREDNISONE 10 MG PO TABS: 20.0000 mg | ORAL_TABLET | Freq: Every day | ORAL | Status: DC

## 2014-02-26 MED ORDER — ALBUTEROL SULFATE HFA 108 (90 BASE) MCG/ACT IN AERS
2.0000 | INHALATION_SPRAY | Freq: Once | RESPIRATORY_TRACT | Status: AC
  Administered 2014-02-26: 2 via RESPIRATORY_TRACT
  Filled 2014-02-26: qty 6.7

## 2014-02-26 MED ORDER — AMOXICILLIN 500 MG PO CAPS: 500.0000 mg | ORAL_CAPSULE | Freq: Three times a day (TID) | ORAL | Status: DC

## 2014-02-26 NOTE — ED Provider Notes (Signed)
CSN: [id number]     Arrival date & time 02/26/14  1854 History   This chart was scribed for Benny Lennert, MD by Modena Jansky, ED Scribe. This patient was seen in room APFT24/APFT24 and the patient's care was started at 7:27 PM.   Chief Complaint  Patient presents with  . Cough   Patient is a 28 y.o. female presenting with cough. The history is provided by the patient. No language interpreter was used.  Cough Cough characteristics:  Dry Severity:  Moderate Onset quality:  Gradual Duration:  1 week Timing:  Intermittent Progression:  Unchanged Chronicity:  New Smoker: yes   Relieved by:  None tried Worsened by:  Nothing tried Ineffective treatments:  None tried Associated symptoms: chest pain, shortness of breath and wheezing   Associated symptoms: no eye discharge, no fever, no headaches, no rash and no sore throat    HPI Comments: [name] is a 28 y.o. female who presents to the Emergency Department complaining of moderate dry cough that started a week ago. Pt reports that she has been sick for a week, but this morning she woke up with wheezing, SOB, and chest pain. She states that the chest pain is exacerbated with deep breathing. She states that she currently smokes. She denies any fever or sore throat.   Past Medical History  Diagnosis Date  . Hx MRSA infection   . Anxiety   . Depression   . Asthma 2003    no inhaler use unless ill with cold etc   Past Surgical History  Procedure Laterality Date  . Cesarean section      2 vessel cord  . Dilation and curettage of uterus    . Hysteroscopy w/d&c  03/31/2012    Procedure: DILATATION AND CURETTAGE /HYSTEROSCOPY;  Surgeon: Leslie Andrea, MD;  Location: [facility];  Service: Gynecology;  Laterality: N/A;  . Laparoscopy  03/31/2012    Procedure: LAPAROSCOPY OPERATIVE;  Surgeon: Leslie Andrea, MD;  Location: [facility];  Service: Gynecology;  Laterality: N/A;  with Biopsies of Bilateral Fallopian Tubes; Fulguration of  Endometriosis  . Laparoscopic assisted vaginal hysterectomy N/A 09/15/2012    Procedure: LAPAROSCOPIC ASSISTED VAGINAL HYSTERECTOMY;  Surgeon: Leslie Andrea, MD;  Location: [facility];  Service: Gynecology;  Laterality: N/A;  . Cystoscopy N/A 09/15/2012    Procedure: CYSTOSCOPY;  Surgeon: Leslie Andrea, MD;  Location: [facility];  Service: Gynecology;  Laterality: N/A;   Family History  Problem Relation Age of Onset  . Anesthesia problems Neg Hx    History  Substance Use Topics  . Smoking status: Current Every Day Smoker -- 1.00 packs/day    Types: Cigarettes  . Smokeless tobacco: Never Used  . Alcohol Use: Yes     Comment: occasionally   OB History   Grav Para Term Preterm Abortions TAB SAB Ect Mult Living   Review of Systems  Constitutional: Negative for fever, appetite change and fatigue.  HENT: Negative for congestion, ear discharge, sinus pressure and sore throat.   Eyes: Negative for discharge.  Respiratory: Positive for cough, shortness of breath and wheezing.   Cardiovascular: Positive for chest pain.  Gastrointestinal: Negative for abdominal pain and diarrhea.  Genitourinary: Negative for frequency and hematuria.  Musculoskeletal: Negative for back pain.  Skin: Negative for rash.  Neurological: Negative for seizures and headaches.  Psychiatric/Behavioral: Negative for hallucinations.  Allergies  Dilaudid  Home Medications   Prior to Admission medications   Medication Sig Start Date End Date Taking? Authorizing Provider  citalopram (CELEXA) 40 MG tablet Take 20 mg by mouth daily.    Historical Provider, MD  docusate sodium (COLACE) 100 MG capsule Take 100 mg by mouth daily.    Historical Provider, MD  fexofenadine (ALLEGRA ALLERGY) 180 MG tablet Take 180 mg by mouth daily.    Historical Provider, MD  HYDROcodone-acetaminophen (NORCO/VICODIN) 5-325 MG per tablet Take 1-2 tablets by mouth every 6 (six) hours as needed. 09/16/12   Roselle Locus II, MD  ibuprofen (ADVIL,MOTRIN) 200 MG tablet Take 600 mg by mouth every 6 (six) hours as needed for pain.    Historical Provider, MD  simethicone (MYLICON) 80 MG chewable tablet Chew 80 mg by mouth every 6 (six) hours as needed for flatulence.    Historical Provider, MD   BP 132/68  Pulse 98  Temp(Src) 98.3 F (36.8 C) (Oral)  Resp 16  Ht  (1.676 m)  Wt 211 lb (95.709 kg)  BMI 34.07 kg/m2  SpO2 99%  LMP 08/18/2012 Physical Exam  Nursing note and vitals reviewed. Constitutional: She appears well-developed and well-nourished. No distress.  HENT:  Head: Normocephalic and atraumatic.  Right Ear: External ear normal.  Left Ear: External ear normal.  Eyes: Conjunctivae are normal. Right eye exhibits no discharge. Left eye exhibits no discharge. No scleral icterus.  Neck: Neck supple. No tracheal deviation present.  Cardiovascular: Normal rate, regular rhythm and intact distal pulses.   Pulmonary/Chest: Effort normal. No stridor. No respiratory distress. She has wheezes. She has no rales.  Minimal wheezing bilaterally,   Abdominal: Soft. Bowel sounds are normal. She exhibits no distension. There is no tenderness. There is no rebound and no guarding.  Musculoskeletal: She exhibits no edema and no tenderness.  Neurological: She is alert. She has normal strength. No cranial nerve deficit (no facial droop, extraocular movements intact, no slurred speech) or sensory deficit. She exhibits normal muscle tone. She displays no seizure activity. Coordination normal.  Skin: Skin is warm and dry. No rash noted.  Psychiatric: She has a normal mood and affect.    ED Course  Procedures (including critical care time) DIAGNOSTIC STUDIES: Oxygen Saturation is 99% on RA, normal by my interpretation.    COORDINATION OF CARE: 7:31 PM- Pt advised of plan for treatment which includes medication and radiology and pt agrees.  Labs Review Labs Reviewed - No data to display  Imaging  Review No results found.   EKG Interpretation None      MDM   Final diagnoses:  None  The chart was scribed for me under my direct supervision.  I personally performed the history, physical, and medical decision making and all procedures in the evaluation of this patient.Benny Lennert, MD 02/26/14 2029

## 2014-02-26 NOTE — ED Notes (Addendum)
Pt c/o a dry cough x 1 week. Pt states her cough has been worse and feels as if she is wheezing. Pt c/o chest hurting when she takes a deep breath.

## 2014-02-26 NOTE — Discharge Instructions (Signed)
Follow up if not improving

## 2014-02-26 NOTE — ED Notes (Signed)
Patient with no complaints at this time. Respirations even and unlabored. Skin warm/dry. Discharge instructions reviewed with patient at this time. Patient given opportunity to voice concerns/ask questions. Patient discharged at this time and left Emergency Department with steady gait.   

## 2014-05-02 ENCOUNTER — Encounter (HOSPITAL_COMMUNITY): Payer: Self-pay | Admitting: Emergency Medicine

## 2014-10-18 ENCOUNTER — Encounter (HOSPITAL_COMMUNITY): Payer: Self-pay | Admitting: Emergency Medicine

## 2014-10-18 ENCOUNTER — Emergency Department (HOSPITAL_COMMUNITY)
Admission: EM | Admit: 2014-10-18 | Discharge: 2014-10-18 | Disposition: A | Discharge status: Home / Self Care | Payer: No Typology Code available for payment source | Attending: Emergency Medicine | Admitting: Emergency Medicine

## 2014-10-18 ENCOUNTER — Emergency Department (HOSPITAL_COMMUNITY): Payer: No Typology Code available for payment source

## 2014-10-18 DIAGNOSIS — Y92410 Unspecified street and highway as the place of occurrence of the external cause: Secondary | ICD-10-CM | POA: Insufficient documentation

## 2014-10-18 DIAGNOSIS — Z720 Tobacco use: Secondary | ICD-10-CM | POA: Insufficient documentation

## 2014-10-18 DIAGNOSIS — S199XXA Unspecified injury of neck, initial encounter: Secondary | ICD-10-CM | POA: Diagnosis not present

## 2014-10-18 DIAGNOSIS — S4991XA Unspecified injury of right shoulder and upper arm, initial encounter: Secondary | ICD-10-CM | POA: Diagnosis present

## 2014-10-18 DIAGNOSIS — J45909 Unspecified asthma, uncomplicated: Secondary | ICD-10-CM | POA: Insufficient documentation

## 2014-10-18 DIAGNOSIS — Y9389 Activity, other specified: Secondary | ICD-10-CM | POA: Diagnosis not present

## 2014-10-18 DIAGNOSIS — S8001XA Contusion of right knee, initial encounter: Secondary | ICD-10-CM

## 2014-10-18 DIAGNOSIS — S46911A Strain of unspecified muscle, fascia and tendon at shoulder and upper arm level, right arm, initial encounter: Secondary | ICD-10-CM | POA: Diagnosis not present

## 2014-10-18 DIAGNOSIS — Y998 Other external cause status: Secondary | ICD-10-CM | POA: Diagnosis not present

## 2014-10-18 DIAGNOSIS — F419 Anxiety disorder, unspecified: Secondary | ICD-10-CM | POA: Diagnosis not present

## 2014-10-18 DIAGNOSIS — S46819A Strain of other muscles, fascia and tendons at shoulder and upper arm level, unspecified arm, initial encounter: Secondary | ICD-10-CM

## 2014-10-18 DIAGNOSIS — Z792 Long term (current) use of antibiotics: Secondary | ICD-10-CM | POA: Diagnosis not present

## 2014-10-18 DIAGNOSIS — Z7952 Long term (current) use of systemic steroids: Secondary | ICD-10-CM | POA: Diagnosis not present

## 2014-10-18 DIAGNOSIS — S59901A Unspecified injury of right elbow, initial encounter: Secondary | ICD-10-CM | POA: Diagnosis not present

## 2014-10-18 DIAGNOSIS — Z8614 Personal history of Methicillin resistant Staphylococcus aureus infection: Secondary | ICD-10-CM | POA: Insufficient documentation

## 2014-10-18 MED ORDER — HYDROCODONE-ACETAMINOPHEN 5-325 MG PO TABS
2.0000 | ORAL_TABLET | Freq: Once | ORAL | Status: AC
  Administered 2014-10-18: 2 via ORAL
  Filled 2014-10-18: qty 2

## 2014-10-18 MED ORDER — ONDANSETRON HCL 4 MG PO TABS: 4.0000 mg | ORAL_TABLET | Freq: Four times a day (QID) | ORAL | Status: DC

## 2014-10-18 MED ORDER — HYDROCODONE-ACETAMINOPHEN 5-325 MG PO TABS
1.0000 | ORAL_TABLET | ORAL | Status: DC | PRN
Start: 2014-10-18 — End: 2015-06-20

## 2014-10-18 MED ORDER — METHOCARBAMOL 500 MG PO TABS: 500.0000 mg | ORAL_TABLET | Freq: Three times a day (TID) | ORAL | Status: DC

## 2014-10-18 MED ORDER — DIAZEPAM 5 MG PO TABS
5.0000 mg | ORAL_TABLET | Freq: Once | ORAL | Status: AC
  Administered 2014-10-18: 5 mg via ORAL
  Filled 2014-10-18: qty 1

## 2014-10-18 MED ORDER — MELOXICAM 15 MG PO TABS: 15.0000 mg | ORAL_TABLET | Freq: Every day | ORAL | Status: DC

## 2014-10-18 MED ORDER — PROMETHAZINE HCL 12.5 MG PO TABS
12.5000 mg | ORAL_TABLET | Freq: Once | ORAL | Status: AC
  Administered 2014-10-18: 12.5 mg via ORAL
  Filled 2014-10-18: qty 1
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
## 2014-10-18 NOTE — ED Notes (Signed)
c-collar placed in triage
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
## 2014-11-17 ENCOUNTER — Ambulatory Visit: Payer: Medicaid Other

## 2014-12-02 ENCOUNTER — Ambulatory Visit: Payer: No Typology Code available for payment source | Attending: Orthopedic Surgery | Admitting: Physical Therapy

## 2015-02-09 ENCOUNTER — Ambulatory Visit (INDEPENDENT_AMBULATORY_CARE_PROVIDER_SITE_OTHER): Payer: Self-pay | Admitting: Neurology

## 2015-02-09 ENCOUNTER — Ambulatory Visit (INDEPENDENT_AMBULATORY_CARE_PROVIDER_SITE_OTHER): Payer: Medicaid Other | Admitting: Neurology

## 2015-02-09 DIAGNOSIS — Z0289 Encounter for other administrative examinations: Secondary | ICD-10-CM

## 2015-02-09 DIAGNOSIS — R202 Paresthesia of skin: Secondary | ICD-10-CM

## 2015-02-09 DIAGNOSIS — M79601 Pain in right arm: Secondary | ICD-10-CM
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
## 2015-02-12 NOTE — Progress Notes (Signed)
See procedure note.

## 2015-04-12 ENCOUNTER — Other Ambulatory Visit: Payer: Self-pay | Admitting: Physical Medicine and Rehabilitation

## 2015-04-12 DIAGNOSIS — M25511 Pain in right shoulder: Secondary | ICD-10-CM

## 2015-05-02 ENCOUNTER — Other Ambulatory Visit: Payer: Medicaid Other

## 2015-05-02 HISTORY — PX: ULNAR NERVE REPAIR: SHX2594

## 2015-05-17 ENCOUNTER — Other Ambulatory Visit: Payer: Medicaid Other

## 2015-06-01 DIAGNOSIS — S43006A Unspecified dislocation of unspecified shoulder joint, initial encounter: Secondary | ICD-10-CM

## 2015-06-01 DIAGNOSIS — M24111 Other articular cartilage disorders, right shoulder: Secondary | ICD-10-CM

## 2015-06-01 HISTORY — DX: Unspecified dislocation of unspecified shoulder joint, initial encounter: S43.006A

## 2015-06-01 HISTORY — DX: Other articular cartilage disorders, right shoulder: M24.111

## 2015-06-05 ENCOUNTER — Ambulatory Visit
Admission: RE | Admit: 2015-06-05 | Discharge: 2015-06-05 | Disposition: A | Discharge status: Home / Self Care | Payer: Medicaid Other | Source: Ambulatory Visit | Attending: Physical Medicine and Rehabilitation | Admitting: Physical Medicine and Rehabilitation

## 2015-06-05 DIAGNOSIS — M25511 Pain in right shoulder: Secondary | ICD-10-CM

## 2015-06-05 MED ORDER — IOHEXOL 180 MG/ML  SOLN: 15.0000 mL | Freq: Once | INTRAMUSCULAR | Status: DC | PRN

## 2015-06-20 ENCOUNTER — Encounter (HOSPITAL_BASED_OUTPATIENT_CLINIC_OR_DEPARTMENT_OTHER): Payer: Self-pay | Admitting: *Deleted

## 2015-06-20 DIAGNOSIS — J042 Acute laryngotracheitis: Secondary | ICD-10-CM

## 2015-06-20 HISTORY — DX: Acute laryngotracheitis: J04.2

## 2015-06-22 ENCOUNTER — Ambulatory Visit: Payer: Self-pay | Admitting: Physician Assistant
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
## 2015-06-28 ENCOUNTER — Ambulatory Visit (HOSPITAL_BASED_OUTPATIENT_CLINIC_OR_DEPARTMENT_OTHER): Payer: Medicaid Other | Admitting: Anesthesiology

## 2015-06-28 ENCOUNTER — Encounter (HOSPITAL_BASED_OUTPATIENT_CLINIC_OR_DEPARTMENT_OTHER)
Admission: RE | Disposition: A | Discharge status: Home / Self Care | Payer: Self-pay | Source: Ambulatory Visit | Attending: Orthopedic Surgery

## 2015-06-28 ENCOUNTER — Encounter (HOSPITAL_BASED_OUTPATIENT_CLINIC_OR_DEPARTMENT_OTHER): Payer: Self-pay | Admitting: Anesthesiology

## 2015-06-28 ENCOUNTER — Ambulatory Visit (HOSPITAL_BASED_OUTPATIENT_CLINIC_OR_DEPARTMENT_OTHER)
Admission: RE | Admit: 2015-06-28 | Discharge: 2015-06-28 | Disposition: A | Discharge status: Home / Self Care | Payer: Medicaid Other | Source: Ambulatory Visit | Attending: Orthopedic Surgery | Admitting: Orthopedic Surgery

## 2015-06-28 DIAGNOSIS — X58XXXA Exposure to other specified factors, initial encounter: Secondary | ICD-10-CM | POA: Insufficient documentation

## 2015-06-28 DIAGNOSIS — F17210 Nicotine dependence, cigarettes, uncomplicated: Secondary | ICD-10-CM | POA: Diagnosis not present

## 2015-06-28 DIAGNOSIS — S43004A Unspecified dislocation of right shoulder joint, initial encounter: Secondary | ICD-10-CM | POA: Insufficient documentation

## 2015-06-28 DIAGNOSIS — Z8614 Personal history of Methicillin resistant Staphylococcus aureus infection: Secondary | ICD-10-CM | POA: Diagnosis not present

## 2015-06-28 HISTORY — DX: Family history of other specified conditions: Z84.89

## 2015-06-28 HISTORY — DX: Bipolar disorder, unspecified: F31.9

## 2015-06-28 HISTORY — DX: Personal history of Methicillin resistant Staphylococcus aureus infection: Z86.14

## 2015-06-28 HISTORY — PX: SHOULDER ARTHROSCOPY WITH CAPSULORRHAPHY: SHX6454

## 2015-06-28 HISTORY — DX: Panic disorder (episodic paroxysmal anxiety): F41.0

## 2015-06-28 HISTORY — DX: Other articular cartilage disorders, right shoulder: M24.111

## 2015-06-28 HISTORY — DX: Acute laryngotracheitis: J04.2

## 2015-06-28 HISTORY — DX: Other complications of anesthesia, initial encounter: T88.59XA

## 2015-06-28 HISTORY — DX: Adverse effect of unspecified anesthetic, initial encounter: T41.45XA

## 2015-06-28 HISTORY — DX: Unspecified dislocation of unspecified shoulder joint, initial encounter: S43.006A

## 2015-06-28 SURGERY — SHOULDER ATHROSCOPY WITH CAPSULORRHAPHY
Anesthesia: General | Site: Shoulder | Laterality: Right

## 2015-06-28 MED ORDER — OXYCODONE HCL 5 MG/5ML PO SOLN: 5.0000 mg | Freq: Once | ORAL | Status: AC | PRN

## 2015-06-28 MED ORDER — CEFAZOLIN SODIUM-DEXTROSE 2-3 GM-% IV SOLR
2.0000 g | INTRAVENOUS | Status: AC
  Administered 2015-06-28: 2 g via INTRAVENOUS

## 2015-06-28 MED ORDER — DEXAMETHASONE SODIUM PHOSPHATE 4 MG/ML IJ SOLN
INTRAMUSCULAR | Status: DC | PRN
  Administered 2015-06-28: 10 mg via INTRAVENOUS

## 2015-06-28 MED ORDER — SUCCINYLCHOLINE CHLORIDE 20 MG/ML IJ SOLN
INTRAMUSCULAR | Status: DC | PRN
  Administered 2015-06-28: 50 mg via INTRAVENOUS

## 2015-06-28 MED ORDER — SODIUM CHLORIDE 0.9 % IR SOLN
Status: DC | PRN
  Administered 2015-06-28: 6000 mL

## 2015-06-28 MED ORDER — OXYCODONE HCL 5 MG PO TABS
ORAL_TABLET | ORAL | Status: AC
  Filled 2015-06-28: qty 1

## 2015-06-28 MED ORDER — OXYCODONE-ACETAMINOPHEN 5-325 MG PO TABS: 1.0000 | ORAL_TABLET | ORAL | Status: DC | PRN

## 2015-06-28 MED ORDER — MEPERIDINE HCL 25 MG/ML IJ SOLN: 6.2500 mg | INTRAMUSCULAR | Status: DC | PRN

## 2015-06-28 MED ORDER — ACETAMINOPHEN 10 MG/ML IV SOLN
INTRAVENOUS | Status: AC
  Filled 2015-06-28: qty 100

## 2015-06-28 MED ORDER — GLYCOPYRROLATE 0.2 MG/ML IJ SOLN
INTRAMUSCULAR | Status: AC
  Filled 2015-06-28: qty 1

## 2015-06-28 MED ORDER — FENTANYL CITRATE (PF) 100 MCG/2ML IJ SOLN
INTRAMUSCULAR | Status: DC | PRN
  Administered 2015-06-28: 100 ug via INTRAVENOUS

## 2015-06-28 MED ORDER — SODIUM CHLORIDE 0.9 % IV SOLN: INTRAVENOUS | Status: DC

## 2015-06-28 MED ORDER — METHOCARBAMOL 500 MG PO TABS: 500.0000 mg | ORAL_TABLET | Freq: Four times a day (QID) | ORAL | Status: DC | PRN

## 2015-06-28 MED ORDER — LIDOCAINE HCL (CARDIAC) 20 MG/ML IV SOLN
INTRAVENOUS | Status: DC | PRN
  Administered 2015-06-28: 50 mg via INTRAVENOUS

## 2015-06-28 MED ORDER — FENTANYL CITRATE (PF) 100 MCG/2ML IJ SOLN
50.0000 ug | INTRAMUSCULAR | Status: AC | PRN
  Administered 2015-06-28: 100 ug via INTRAVENOUS
  Administered 2015-06-28 (×2): 50 ug via INTRAVENOUS

## 2015-06-28 MED ORDER — SUCCINYLCHOLINE CHLORIDE 20 MG/ML IJ SOLN
INTRAMUSCULAR | Status: AC
  Filled 2015-06-28: qty 1

## 2015-06-28 MED ORDER — ONDANSETRON HCL 4 MG/2ML IJ SOLN
INTRAMUSCULAR | Status: AC
  Filled 2015-06-28: qty 2

## 2015-06-28 MED ORDER — METHYLPREDNISOLONE ACETATE 80 MG/ML IJ SUSP
INTRAMUSCULAR | Status: AC
  Filled 2015-06-28: qty 1

## 2015-06-28 MED ORDER — PROPOFOL 500 MG/50ML IV EMUL
INTRAVENOUS | Status: AC
  Filled 2015-06-28: qty 50

## 2015-06-28 MED ORDER — PROPOFOL 10 MG/ML IV BOLUS
INTRAVENOUS | Status: AC
  Filled 2015-06-28: qty 20

## 2015-06-28 MED ORDER — FENTANYL CITRATE (PF) 100 MCG/2ML IJ SOLN
INTRAMUSCULAR | Status: AC
  Filled 2015-06-28: qty 2

## 2015-06-28 MED ORDER — MIDAZOLAM HCL 5 MG/ML IJ SOLN: 2.0000 mg | Freq: Once | INTRAMUSCULAR | Status: DC

## 2015-06-28 MED ORDER — OXYCODONE HCL 5 MG PO TABS
5.0000 mg | ORAL_TABLET | Freq: Once | ORAL | Status: AC | PRN
  Administered 2015-06-28: 5 mg via ORAL

## 2015-06-28 MED ORDER — MIDAZOLAM HCL 2 MG/2ML IJ SOLN
INTRAMUSCULAR | Status: AC
  Filled 2015-06-28: qty 2

## 2015-06-28 MED ORDER — CEFAZOLIN SODIUM-DEXTROSE 2-3 GM-% IV SOLR
INTRAVENOUS | Status: AC
  Filled 2015-06-28: qty 50

## 2015-06-28 MED ORDER — ATROPINE SULFATE 0.4 MG/ML IJ SOLN
INTRAMUSCULAR | Status: AC
  Filled 2015-06-28: qty 1

## 2015-06-28 MED ORDER — MIDAZOLAM HCL 5 MG/5ML IJ SOLN
INTRAMUSCULAR | Status: DC | PRN
  Administered 2015-06-28: 2 mg via INTRAVENOUS

## 2015-06-28 MED ORDER — LACTATED RINGERS IV SOLN: INTRAVENOUS | Status: DC

## 2015-06-28 MED ORDER — LIDOCAINE HCL (CARDIAC) 20 MG/ML IV SOLN
INTRAVENOUS | Status: AC
  Filled 2015-06-28: qty 5

## 2015-06-28 MED ORDER — PROPOFOL 10 MG/ML IV BOLUS
INTRAVENOUS | Status: DC | PRN
  Administered 2015-06-28 (×2): 50 mg via INTRAVENOUS
  Administered 2015-06-28: 200 mg via INTRAVENOUS

## 2015-06-28 MED ORDER — LACTATED RINGERS IV SOLN
INTRAVENOUS | Status: DC | PRN
  Administered 2015-06-28 (×2): via INTRAVENOUS

## 2015-06-28 MED ORDER — METHOCARBAMOL 750 MG PO TABS: 750.0000 mg | ORAL_TABLET | Freq: Four times a day (QID) | ORAL | Status: DC | PRN

## 2015-06-28 MED ORDER — ONDANSETRON HCL 4 MG PO TABS: 4.0000 mg | ORAL_TABLET | Freq: Four times a day (QID) | ORAL | Status: DC | PRN

## 2015-06-28 MED ORDER — CHLORHEXIDINE GLUCONATE 4 % EX LIQD: 60.0000 mL | Freq: Once | CUTANEOUS | Status: DC

## 2015-06-28 MED ORDER — BUPIVACAINE-EPINEPHRINE (PF) 0.5% -1:200000 IJ SOLN
INTRAMUSCULAR | Status: AC
  Filled 2015-06-28: qty 30

## 2015-06-28 MED ORDER — METHOCARBAMOL 1000 MG/10ML IJ SOLN: 500.0000 mg | Freq: Four times a day (QID) | INTRAVENOUS | Status: DC | PRN

## 2015-06-28 MED ORDER — HYDROMORPHONE HCL 1 MG/ML IJ SOLN: 0.2500 mg | INTRAMUSCULAR | Status: DC | PRN

## 2015-06-28 MED ORDER — METOCLOPRAMIDE HCL 5 MG/ML IJ SOLN: 5.0000 mg | Freq: Three times a day (TID) | INTRAMUSCULAR | Status: DC | PRN

## 2015-06-28 MED ORDER — ONDANSETRON HCL 4 MG/2ML IJ SOLN
INTRAMUSCULAR | Status: DC | PRN
Start: 2015-06-28 — End: 2015-06-28
  Administered 2015-06-28: 4 mg via INTRAVENOUS

## 2015-06-28 MED ORDER — DOCUSATE SODIUM 100 MG PO CAPS: 100.0000 mg | ORAL_CAPSULE | Freq: Two times a day (BID) | ORAL | Status: DC

## 2015-06-28 MED ORDER — SCOPOLAMINE 1 MG/3DAYS TD PT72: 1.0000 | MEDICATED_PATCH | Freq: Once | TRANSDERMAL | Status: DC | PRN

## 2015-06-28 MED ORDER — FENTANYL CITRATE (PF) 100 MCG/2ML IJ SOLN
25.0000 ug | INTRAMUSCULAR | Status: DC | PRN
Start: 2015-06-28 — End: 2015-06-28

## 2015-06-28 MED ORDER — PHENYLEPHRINE HCL 10 MG/ML IJ SOLN
INTRAMUSCULAR | Status: AC
  Filled 2015-06-28: qty 1

## 2015-06-28 MED ORDER — ONDANSETRON HCL 4 MG/2ML IJ SOLN: 4.0000 mg | Freq: Four times a day (QID) | INTRAMUSCULAR | Status: DC | PRN

## 2015-06-28 MED ORDER — METOCLOPRAMIDE HCL 5 MG PO TABS: 5.0000 mg | ORAL_TABLET | Freq: Three times a day (TID) | ORAL | Status: DC | PRN

## 2015-06-28 MED ORDER — MIDAZOLAM HCL 2 MG/2ML IJ SOLN
1.0000 mg | INTRAMUSCULAR | Status: DC | PRN
  Administered 2015-06-28: 2 mg via INTRAVENOUS
  Administered 2015-06-28 (×2): 0.5 mg via INTRAVENOUS

## 2015-06-28 MED ORDER — DEXAMETHASONE SODIUM PHOSPHATE 10 MG/ML IJ SOLN
INTRAMUSCULAR | Status: AC
  Filled 2015-06-28: qty 1

## 2015-06-28 MED ORDER — EPHEDRINE SULFATE 50 MG/ML IJ SOLN
INTRAMUSCULAR | Status: AC
  Filled 2015-06-28: qty 1

## 2015-06-28 MED ORDER — GLYCOPYRROLATE 0.2 MG/ML IJ SOLN: 0.2000 mg | Freq: Once | INTRAMUSCULAR | Status: DC | PRN

## 2015-06-28 SURGICAL SUPPLY — 84 items
ANCH SUT SHRT 12.5 CANN EYLT (Anchor) ×1 IMPLANT
ANCHOR SUT BIOCOMP LK 2.9X12.5 (Anchor) ×2 IMPLANT
APL SKNCLS STERI-STRIP NONHPOA (GAUZE/BANDAGES/DRESSINGS)
BENZOIN TINCTURE PRP APPL 2/3 (GAUZE/BANDAGES/DRESSINGS) IMPLANT
BLADE 4.2CUDA (BLADE) ×3 IMPLANT
BLADE CUTTER GATOR 3.5 (BLADE) IMPLANT
BLADE SURG 10 STRL SS (BLADE) IMPLANT
BLADE SURG 15 STRL LF DISP TIS (BLADE) IMPLANT
BLADE SURG 15 STRL SS (BLADE)
BLADE VORTEX 6.0 (BLADE) IMPLANT
BUR 3.5 LG SPHERICAL (BURR) ×1 IMPLANT
BUR VERTEX HOODED 4.5 (BURR) IMPLANT
BURR 3.5 LG SPHERICAL (BURR)
BURR 3.5MM LG SPHERICAL (BURR)
CANNULA 5.75X71 LONG (CANNULA) IMPLANT
CANNULA SHOULDER 7CM (CANNULA) ×3 IMPLANT
CANNULA TWIST IN 8.25X7CM (CANNULA) ×3 IMPLANT
CLEANER CAUTERY TIP 5X5 PAD (MISCELLANEOUS) IMPLANT
CLOSURE WOUND 1/2 X4 (GAUZE/BANDAGES/DRESSINGS)
CUTTER MENISCUS  4.2MM (BLADE)
CUTTER MENISCUS 4.2MM (BLADE) IMPLANT
DECANTER SPIKE VIAL GLASS SM (MISCELLANEOUS) IMPLANT
DRAPE STERI 35X30 U-POUCH (DRAPES) ×3 IMPLANT
DRAPE SURG 17X23 STRL (DRAPES) ×3 IMPLANT
DRAPE U-SHAPE 47X51 STRL (DRAPES) ×3 IMPLANT
DRAPE U-SHAPE 76X120 STRL (DRAPES) ×6 IMPLANT
DRSG EMULSION OIL 3X3 NADH (GAUZE/BANDAGES/DRESSINGS) ×2 IMPLANT
DRSG PAD ABDOMINAL 8X10 ST (GAUZE/BANDAGES/DRESSINGS) ×3 IMPLANT
DURAPREP 26ML APPLICATOR (WOUND CARE) ×3 IMPLANT
ELECT REM PT RETURN 9FT ADLT (ELECTROSURGICAL) ×3
ELECTRODE REM PT RTRN 9FT ADLT (ELECTROSURGICAL) IMPLANT
FIBERSTICK 2 (SUTURE) ×2 IMPLANT
GAUZE SPONGE 4X4 12PLY STRL (GAUZE/BANDAGES/DRESSINGS) ×3 IMPLANT
GLOVE BIO SURGEON STRL SZ7.5 (GLOVE) ×2 IMPLANT
GLOVE BIOGEL PI IND STRL 8 (GLOVE) ×2 IMPLANT
GLOVE BIOGEL PI INDICATOR 8 (GLOVE) ×4
GLOVE SURG ORTHO 8.0 STRL STRW (GLOVE) ×3 IMPLANT
GOWN STRL REUS W/ TWL LRG LVL3 (GOWN DISPOSABLE) IMPLANT
GOWN STRL REUS W/ TWL XL LVL3 (GOWN DISPOSABLE) ×1 IMPLANT
GOWN STRL REUS W/TWL LRG LVL3 (GOWN DISPOSABLE)
GOWN STRL REUS W/TWL XL LVL3 (GOWN DISPOSABLE) ×3
KIT PUSHLOCK 2.9 HIP (KITS) ×3 IMPLANT
LASSO 90 CVE QUICKPAS (DISPOSABLE) ×2 IMPLANT
MANIFOLD NEPTUNE II (INSTRUMENTS) ×3 IMPLANT
NDL 1/2 CIR CATGUT .05X1.09 (NEEDLE) IMPLANT
NEEDLE 1/2 CIR CATGUT .05X1.09 (NEEDLE) IMPLANT
NS IRRIG 1000ML POUR BTL (IV SOLUTION) ×3 IMPLANT
PACK ARTHROSCOPY DSU (CUSTOM PROCEDURE TRAY) ×3 IMPLANT
PACK BASIN DAY SURGERY FS (CUSTOM PROCEDURE TRAY) ×3 IMPLANT
PAD CLEANER CAUTERY TIP 5X5 (MISCELLANEOUS)
PAD ORTHO SHOULDER 7X19 LRG (SOFTGOODS) IMPLANT
PENCIL BUTTON HOLSTER BLD 10FT (ELECTRODE) IMPLANT
SET ARTHROSCOPY TUBING (MISCELLANEOUS) ×3
SET ARTHROSCOPY TUBING LN (MISCELLANEOUS) ×1 IMPLANT
SLING ARM FOAM STRAP LRG (SOFTGOODS) IMPLANT
SLING ARM IMMOBILIZER LRG (SOFTGOODS) ×2 IMPLANT
SLING ARM MED ADULT FOAM STRAP (SOFTGOODS) IMPLANT
SLING ULTRA II MEDIUM (SOFTGOODS) IMPLANT
SLING ULTRA II SMALL (SOFTGOODS) IMPLANT
SPONGE LAP 4X18 X RAY DECT (DISPOSABLE) IMPLANT
STAPLER VISISTAT 35W (STAPLE) IMPLANT
STRIP CLOSURE SKIN 1/2X4 (GAUZE/BANDAGES/DRESSINGS) IMPLANT
SUCTION FRAZIER HANDLE 10FR (MISCELLANEOUS)
SUCTION TUBE FRAZIER 10FR DISP (MISCELLANEOUS) IMPLANT
SUT BONE WAX W31G (SUTURE) IMPLANT
SUT ETHIBOND 2 OS 4 DA (SUTURE) IMPLANT
SUT ETHILON 3 0 PS 1 (SUTURE) ×3 IMPLANT
SUT FIBERWIRE #2 38 T-5 BLUE (SUTURE)
SUT PROLENE 3 0 PS 2 (SUTURE) IMPLANT
SUT TICRON 1 T 12 (SUTURE) IMPLANT
SUT VIC AB 0 CT1 27 (SUTURE)
SUT VIC AB 0 CT1 27XBRD ANBCTR (SUTURE) IMPLANT
SUT VIC AB 1 CT1 27 (SUTURE)
SUT VIC AB 1 CT1 27XBRD ANBCTR (SUTURE) IMPLANT
SUT VIC AB 2-0 PS2 27 (SUTURE) IMPLANT
SUT VIC AB 2-0 SH 27 (SUTURE)
SUT VIC AB 2-0 SH 27XBRD (SUTURE) IMPLANT
SUTURE FIBERWR #2 38 T-5 BLUE (SUTURE) IMPLANT
TAPE SUT LABRALTAP WHT/BLK (SUTURE) ×3 IMPLANT
TOWEL OR 17X24 6PK STRL BLUE (TOWEL DISPOSABLE) ×3 IMPLANT
TOWEL OR NON WOVEN STRL DISP B (DISPOSABLE) ×3 IMPLANT
WAND STAR VAC 90 (SURGICAL WAND) IMPLANT
WATER STERILE IRR 1000ML POUR (IV SOLUTION) ×3 IMPLANT
YANKAUER SUCT BULB TIP NO VENT (SUCTIONS) IMPLANT
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
## 2015-06-28 NOTE — Op Note (Signed)
NAMClarise Cruz:  Sigman, Tabby             ACCOUNT NO.:  [id number]  MEDICAL RECORD NO.:  [id number]  LOCATION:  [id number]                        FACILITY:  [facility]  PHYSICIAN:  Dyke BrackettW. D. Lissandro Dilorenzo, [name]    DATE OF BIRTH:  03-13-1986  DATE OF PROCEDURE: DATE OF DISCHARGE:  10/18/2014                              OPERATIVE REPORT   INDICATIONS:  A 29 year old female, MRI, relatively normal, persistent pain.  Exam consists with possible subluxation, thought to be a minimal outpatient surgery.  PREOPERATIVE DIAGNOSIS:  Anterior-inferior subluxation, right shoulder.  POSTOPERATIVE DIAGNOSIS:  Anterior-inferior subluxation, right shoulder.  OPERATION: 1. Flexible degenerative tearing of anterior superior labrum. 2. Arthroscopic debridement (extensive of torn labrum). 3. Arthroscopic capsulorrhaphy, all for the right shoulder.  DESCRIPTION OF PROCEDURE:  A very mild anterior inferior subluxation was noted.  Under anesthesia, right compared to left.  Arthroscoped beach chair position.  Accessory anterior, superior, and  posterior portals. Systematic inspection of the shoulder showed extensive tear in the anterior superior labrum debrided, undersurface cuff normal biceps, tendon, and anchor intact.  There was a patulous capsule, consistent with possible mild subluxation and some hyperemic, consistent with possible trauma to the capsule ligaments.  We elected to perform a capsulorrhaphy with an Arthrex, a knotless anchor.  Specifically we took inferior tissue from the anterior-inferior capsule with a trocar.  We then passed a Nitinol loop wire, grabbed it out the superior aspect of the superior portal.  We then pulled the suture to the superior portal. We then placed a Nitinol wire with the loop out the anterior aspect of the shoulder.  Retrieve the cut end of the wire and then pulled the 2nd limb of the suture to have 2 sutures superiorly.  We then shuttled these inferiorly, created a mattress stitch.   This was really a once this buttress suture for a capsulorrhaphy for what I would describe as patulous capsule.  This brought the capsule up to the glenoid nicely.  I elected and thought that this due to the fragility of the tissue, 1 stitch was optimal and we did do debridement before that as well as the labrum.  Shoulder was placed in lightly compressive sterile dressing in a shoulder immobilizer.  Taken to recovery room in stable condition.     Dyke BrackettW. D. Givanni Staron, [name]     WDC/MEDQ  D:  06/28/2015  T:  06/28/2015  Job:  [id number]

## 2015-06-28 NOTE — Brief Op Note (Signed)
06/28/2015  3:35 PM  PATIENT:  Jeneen Montgomeryanielle B Antolin  29 y.o. female  PRE-OPERATIVE DIAGNOSIS:  UNSPECIFIED DISLOCATION OF UNSPECIFIED SHOULDER JOINT INITIAL ENCOUNTER OTHER ARTICULAR CARTILAGE DISORDERS RIGHT SHOULDER   POST-OPERATIVE DIAGNOSIS:  UNSPECIFIED DISLOCATION OF UNSPECIFIED SHOULDER JOINT INITIAL ENCOUNTER OTHER ARTICULAR CARTILAGE DISORDERS RIGHT SHOULDER  PROCEDURE:  Right shoulder arthroscopy capsulorrhaphy  SURGEON:  Surgeon(s) and Role:    * Frederico Hammananiel Caffrey, MD - Primary  PHYSICIAN ASSISTANT: Margart SicklesJoshua Kimley Apsey, PA-C  ASSISTANTS:    ANESTHESIA:   regional and general  EBL:  Total I/O In: 1000 [I.V.:1000] Out: -   BLOOD ADMINISTERED:none  DRAINS: none   LOCAL MEDICATIONS USED:  NONE  SPECIMEN:  No Specimen  DISPOSITION OF SPECIMEN:  N/A  COUNTS:  YES  TOURNIQUET:  * No tourniquets in log *  DICTATION: .Other Dictation: Dictation Number unknown  PLAN OF CARE: Discharge to home after PACU  PATIENT DISPOSITION:  PACU - hemodynamically stable.   Delay start of Pharmacological VTE agent (>24hrs) due to surgical blood loss or risk of bleeding: not applicable

## 2015-06-28 NOTE — Progress Notes (Signed)
Assisted Dr. Crews with right, ultrasound guided, supraclavicular block. Side rails up, monitors on throughout procedure. See vital signs in flow sheet. Tolerated Procedure well. 

## 2015-06-28 NOTE — Transfer of Care (Signed)
Immediate Anesthesia Transfer of Care Note  Patient: Monica MontgomeryDanielle B Harney  Procedure(s) Performed: Procedure(s): RIGHT SHOULDER ARTHROSCOPY WITH CAPSULORRHAPHY (Right)  Patient Location: PACU  Anesthesia Type:GA combined with regional for post-op pain  Level of Consciousness: awake and patient cooperative  Airway & Oxygen Therapy: Patient Spontanous Breathing and Patient connected to face mask oxygen  Post-op Assessment: Report given to RN and Post -op Vital signs reviewed and stable  Post vital signs: Reviewed and stable  Last Vitals:  Filed Vitals:   06/28/15 1200 06/28/15 1546  BP:    Pulse: 102 100  Temp:  36.6 C  Resp: 29 18    Complications: No apparent anesthesia complications

## 2015-06-28 NOTE — Anesthesia Postprocedure Evaluation (Signed)
Anesthesia Post Note  Patient: Monica MontgomeryDanielle B [name]  Procedure(s) Performed: Procedure(s) (LRB): RIGHT SHOULDER ARTHROSCOPY WITH CAPSULORRHAPHY (Right)  Patient location during evaluation: PACU Anesthesia Type: General Level of consciousness: awake and alert Pain management: pain level controlled Vital Signs Assessment: post-procedure vital signs reviewed and stable Respiratory status: spontaneous breathing, nonlabored ventilation, respiratory function stable and patient connected to nasal cannula oxygen Cardiovascular status: blood pressure returned to baseline and stable Postop Assessment: no signs of nausea or vomiting Anesthetic complications: no    Last Vitals:  Filed Vitals:   06/28/15 1200 06/28/15 1546  BP:    Pulse: 102 100  Temp:  36.6 C  Resp: 29 18    Last Pain:  Filed Vitals:   06/28/15 1552  PainSc: 7                  Bobbie Valletta A

## 2015-06-28 NOTE — Anesthesia Preprocedure Evaluation (Signed)
Anesthesia Evaluation  Patient identified by MRN, date of birth, ID band Patient awake    Reviewed: Allergy & Precautions, NPO status , Patient's Chart, lab work & pertinent test results  Airway Mallampati: I  TM Distance: >3 FB Neck ROM: Full    Dental  (+) Teeth Intact, Dental Advisory Given   Pulmonary Current Smoker,    breath sounds clear to auscultation       Cardiovascular  Rhythm:Regular Rate:Normal     Neuro/Psych    GI/Hepatic   Endo/Other    Renal/GU      Musculoskeletal   Abdominal   Peds  Hematology   Anesthesia Other Findings   Reproductive/Obstetrics                             Anesthesia Physical Anesthesia Plan  ASA: I  Anesthesia Plan: General   Post-op Pain Management: MAC Combined w/ Regional for Post-op pain   Induction: Intravenous  Airway Management Planned: Oral ETT  Additional Equipment:   Intra-op Plan:   Post-operative Plan: Extubation in OR  Informed Consent: I have reviewed the patients History and Physical, chart, labs and discussed the procedure including the risks, benefits and alternatives for the proposed anesthesia with the patient or authorized representative who has indicated his/her understanding and acceptance.   Dental advisory given  Plan Discussed with: CRNA, Anesthesiologist and Surgeon  Anesthesia Plan Comments:         Anesthesia Quick Evaluation

## 2015-06-28 NOTE — Discharge Instructions (Signed)
Diet: As you were doing prior to hospitalization   Activity: Increase activity slowly as tolerated  No lifting or driving for 6 weeks   Shower: May shower without a dressing on post op day #3, NO SOAKING in tub   Dressing: You may change your dressing on post op day #3.  Then change the dressing daily with sterile 4"x4"s gauze dressing  Or band aids   Weight Bearing: nonweight bearing right arm, stay in sling with immobilizer at all times except shower then keep arm close to body.  To prevent constipation: you may use a stool softener such as -  Colace ( over the counter) 100 mg by mouth twice a day  Drink plenty of fluids ( prune juice may be helpful) and high fiber foods  Miralax ( over the counter) for constipation as needed.   Precautions: If you experience chest pain or shortness of breath - call 911 immediately For transfer to the hospital emergency department!!  If you develop a fever greater that 101 F, purulent drainage from wound, increased redness or drainage from wound, or calf pain -- Call the office   Follow- Up Appointment: Please call for an appointment to be seen in 1 week  [place] [phone]    Post Anesthesia Home Care Instructions  Activity: Get plenty of rest for the remainder of the day. A responsible adult should stay with you for 24 hours following the procedure.  For the next 24 hours, DO NOT: -Drive a car -Advertising copywriterperate machinery -Drink alcoholic beverages -Take any medication unless instructed by your physician -Make any legal decisions or sign important papers.  Meals: Start with liquid foods such as gelatin or soup. Progress to regular foods as tolerated. Avoid greasy, spicy, heavy foods. If nausea and/or vomiting occur, drink only clear liquids until the nausea and/or vomiting subsides. Call your physician if vomiting continues.  Special Instructions/Symptoms: Your throat may feel dry or sore from the anesthesia or the breathing tube placed in  your throat during surgery. If this causes discomfort, gargle with warm salt water. The discomfort should disappear within 24 hours.  If you had a scopolamine patch placed behind your ear for the management of post- operative nausea and/or vomiting:  1. The medication in the patch is effective for 72 hours, after which it should be removed.  Wrap patch in a tissue and discard in the trash. Wash hands thoroughly with soap and water. 2. You may remove the patch earlier than 72 hours if you experience unpleasant side effects which may include dry mouth, dizziness or visual disturbances. 3. Avoid touching the patch. Wash your hands with soap and water after contact with the patch.    Regional Anesthesia Blocks  1. Numbness or the inability to move the "blocked" extremity may last from 3-48 hours after placement. The length of time depends on the medication injected and your individual response to the medication. If the numbness is not going away after 48 hours, call your surgeon.  2. The extremity that is blocked will need to be protected until the numbness is gone and the  Strength has returned. Because you cannot feel it, you will need to take extra care to avoid injury. Because it may be weak, you may have difficulty moving it or using it. You may not know what position it is in without looking at it while the block is in effect.  3. For blocks in the legs and feet, returning to weight bearing and walking needs  to be done carefully. You will need to wait until the numbness is entirely gone and the strength has returned. You should be able to move your leg and foot normally before you try and bear weight or walk. You will need someone to be with you when you first try to ensure you do not fall and possibly risk injury.  4. Bruising and tenderness at the needle site are common side effects and will resolve in a few days.  5. Persistent numbness or new problems with movement should be communicated to  the surgeon or the [facility] [phone] [facility] [phone]).

## 2015-06-28 NOTE — Interval H&P Note (Signed)
History and Physical Interval Note:  06/28/2015 2:25 PM  [name]  has presented today for surgery, with the diagnosis of UNSPECIFIED DISLOCATION OF UNSPECIFIED SHOULDER JOINT INITIAL ENCOUNTER OTHER ARTICULAR CARTILAGE DISORDERS RIGHT SHOULDER   The various methods of treatment have been discussed with the patient and family. After consideration of risks, benefits and other options for treatment, the patient has consented to  Procedure(s): RIGHT SHOULDER ARTHROSCOPY WITH BANKART REPAIR (Right) as a surgical intervention .  The patient's history has been reviewed, patient examined, no change in status, stable for surgery.  I have reviewed the patient's chart and labs.  Questions were answered to the patient's satisfaction.     Arvella Massingale JR,W D

## 2015-06-28 NOTE — Anesthesia Procedure Notes (Addendum)
Anesthesia Regional Block:  Interscalene brachial plexus block  Pre-Anesthetic Checklist: ,, timeout performed, Correct Patient, Correct Site, Correct Laterality, Correct Procedure, Correct Position, site marked, Risks and benefits discussed,  Surgical consent,  Pre-op evaluation,  At surgeon's request and post-op pain management  Laterality: Right and Upper  Prep: chloraprep       Needles:  Injection technique: Single-shot  Needle Type: Echogenic Needle     Needle Length: 5cm 5 cm Needle Gauge: 21 and 21 G    Additional Needles: Interscalene brachial plexus block Narrative:  Start time: 06/28/2015 11:53 AM End time: 06/28/2015 11:58 AM Injection made incrementally with aspirations every 5 mL.  Performed by: Personally  Anesthesiologist: CREWS, DAVID  Additional Notes: US image was not saved due to server failure, however it was used.   Procedure Name: Intubation Date/Time: 06/28/2015 2:39 PM Performed by:  DesanctisLINKA, Annmargaret Decaprio L Pre-anesthesia Checklist: Patient identified, Emergency Drugs available, Suction available, Patient being monitored and Timeout performed Patient Re-evaluated:Patient Re-evaluated prior to inductionOxygen Delivery Method: Circle System Utilized Preoxygenation: Pre-oxygenation with 100% oxygen Intubation Type: IV induction Ventilation: Mask ventilation without difficulty Laryngoscope Size: Miller and 3 Grade View: Grade II Tube type: Oral Tube size: 7.0 mm Number of attempts: 1 Airway Equipment and Method: Stylet and Oral airway Placement Confirmation: ETT inserted through vocal cords under direct vision,  positive ETCO2 and breath sounds checked- equal and bilateral Secured at: 21 cm Tube secured with: Tape Dental Injury: Teeth and Oropharynx as per pre-operative assessment

## 2015-06-29 ENCOUNTER — Encounter (HOSPITAL_BASED_OUTPATIENT_CLINIC_OR_DEPARTMENT_OTHER): Payer: Self-pay | Admitting: Orthopedic Surgery

## 2015-06-30 NOTE — Op Note (Signed)
NAME:  Judee ClaraGIEBER, Keenya                  ACCOUNT NO.:  MEDICAL RECORD NO.:  [id number]  LOCATION:                                 FACILITY:  PHYSICIAN:  Dyke BrackettW. D. Reginae Wolfrey, [name]    DATE OF BIRTH:  1985/08/22  DATE OF PROCEDURE:  06/28/2015 DATE OF DISCHARGE:                              OPERATIVE REPORT   POSTOPERATIVE DIAGNOSIS:  Right shoulder anterior-inferior stability, with fraying-type tear of the labrum.  POSTOPERATIVE DIAGNOSIS:  Right shoulder anterior-inferior stability, with fraying-type tear of the labrum.  OPERATION: 1. Labral debridement. 2. Arthroscopic capsulorrhaphy, right shoulder.  SURGEON:  Dyke BrackettW. D. Jaxxson Cavanah, [name]  ASSISTANT:  Margart SicklesJoshua Chadwell, PA-C.  ANESTHESIA:  General with a nerve block.  BLOOD LOSS:  Minimal.  DESCRIPTION OF PROCEDURE:  She was thoroughly examined under anesthesia. I could not really say for certain whether the right shoulder was more lax under anesthesia in the left.  On inspection of the shoulder, I felt like there was some lax to the anterior-inferior capsule.  There was no frank Bankart.  There were no Hill-Sachs.  There was degenerative tearing in the anterior superior labrum, which was debrided.  We elected with the basis of the positive drive-through sign as well as the fact that the patient had a noticeable click without instability symptoms of abduction, external rotation to perform a mini capsulorrhaphy, specifically using the Arthrex anchor with a fiber tape.  Performed 1 mattress suture on the inferior capsule to plicate it anterior inferiorly.  This was mattress suture, placed through using a superior cane with an anterior cane, a larger cane anteriorly.  Specifically, we passed the trocar through the tissue.  I fit a wire through that.  We then retrieved the wire through the superior portal, relate the suture anteriorly.  We then passed a wire in a reverse order and then retrieved it superiorly again and then passed a wire,  tapped 2 strands to the superior cannula.  We then placed the anchor through the inferior cannula to perform a mattress suture of the anterior-inferior capsule. Skin debris was carried out as well.  Placed in a shoulder immobilizer. Taken to recovery in stable condition.    Dyke BrackettW. D. Hiba Garry, [name]    WDC/MEDQ  D:  06/30/2015  T:  06/30/2015  Job:  [id number]

## 2015-08-14 ENCOUNTER — Ambulatory Visit (HOSPITAL_COMMUNITY): Payer: Medicaid Other | Attending: Orthopedic Surgery

## 2015-08-14 ENCOUNTER — Encounter (HOSPITAL_COMMUNITY): Payer: Self-pay

## 2015-08-14 DIAGNOSIS — M6289 Other specified disorders of muscle: Secondary | ICD-10-CM

## 2015-08-14 DIAGNOSIS — M25511 Pain in right shoulder: Secondary | ICD-10-CM | POA: Insufficient documentation

## 2015-08-14 DIAGNOSIS — M629 Disorder of muscle, unspecified: Secondary | ICD-10-CM | POA: Diagnosis present

## 2015-08-14 DIAGNOSIS — Z9889 Other specified postprocedural states: Secondary | ICD-10-CM | POA: Diagnosis not present

## 2015-08-14 DIAGNOSIS — R29898 Other symptoms and signs involving the musculoskeletal system: Secondary | ICD-10-CM | POA: Diagnosis present

## 2015-08-14 DIAGNOSIS — M25611 Stiffness of right shoulder, not elsewhere classified: Secondary | ICD-10-CM | POA: Diagnosis present
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
## 2015-08-14 NOTE — Patient Instructions (Signed)
*  Use ice for pain or heat to relax tight muscles. Place on shoulder for 15 minutes then remove for 15 minutes. Use as much as you need to.   *Use tennis ball to complete self massage to shoulder and back of shoulder.    TOWEL SLIDES COMPLETE FOR 1-3 MINUTES, 3-5 TIMES PER DAY  SHOULDER: Flexion On Table   Place hands on table, elbows straight. Move hips away from body. Press hands down into table. Hold ___ seconds. ___ reps per set, ___ sets per day, ___ days per week  Abduction (Passive)   With arm out to side, resting on table, lower head toward arm, keeping trunk away from table. Hold ____ seconds. Repeat ____ times. Do ____ sessions per day.  Copyright  VHI. All rights reserved.     Internal Rotation (Assistive)   Seated with elbow bent at right angle and held against side, slide arm on table surface in an inward arc. Repeat ____ times. Do ____ sessions per day. Activity: Use this motion to brush crumbs off the table.  Copyright  VHI. All rights reserved.    COMPLETE PENDULUM EXERCISES FOR 30 SECONDS TO A MINUTE EACH, 3-5 TIMES PER DAY. ROM: Pendulum (Side-to-Side)       Copyright  VHI. All rights reserved.  Pendulum Forward/Back   Bend forward 90 at waist, using table for support. Rock body forward and back to swing arm. Repeat ____ times. Do ____ sessions per day.  Copyright  VHI. All rights reserved.  Pendulum Circular   Bend forward 90 at waist, leaning on table for support. Rock body in a circular pattern to move arm clockwise ____ times then counterclockwise ____ times. Do ____ sessions per day.  Copyright  VHI. All rights reserved.  AROM: Wrist Extension   With right palm down, bend wrist up. Repeat 10____ times per set. Do ____ sets per session. Do __3__ sessions per day.  Copyright  VHI. All rights reserved.   AROM: Wrist Flexion   With right palm up, bend wrist up. Repeat ___10_ times per set. Do ____ sets per session. Do __3__  sessions per day.  Copyright  VHI. All rights reserved.   AROM: Forearm Pronation / Supination   With right arm in handshake position, slowly rotate palm down until stretch is felt. Relax. Then rotate palm up until stretch is felt. Repeat __10__ times per set. Do ____ sets per session. Do __3__ sessions per day.  ELBOW FLEXION EXTENSION  Bend your elbow upwards as shown and then lower to a straighten position.  Complete 10 times. Do 3 session per day.

## 2015-09-01 ENCOUNTER — Ambulatory Visit (HOSPITAL_COMMUNITY): Payer: Medicaid Other | Attending: Orthopedic Surgery | Admitting: Occupational Therapy

## 2015-09-01 ENCOUNTER — Encounter (HOSPITAL_COMMUNITY): Payer: Self-pay | Admitting: Occupational Therapy

## 2015-09-01 DIAGNOSIS — R29898 Other symptoms and signs involving the musculoskeletal system: Secondary | ICD-10-CM | POA: Insufficient documentation

## 2015-09-01 DIAGNOSIS — M25511 Pain in right shoulder: Secondary | ICD-10-CM | POA: Diagnosis not present

## 2015-09-01 DIAGNOSIS — M6289 Other specified disorders of muscle: Secondary | ICD-10-CM

## 2015-09-01 DIAGNOSIS — M25611 Stiffness of right shoulder, not elsewhere classified: Secondary | ICD-10-CM | POA: Diagnosis present

## 2015-09-01 DIAGNOSIS — M629 Disorder of muscle, unspecified: Secondary | ICD-10-CM | POA: Diagnosis present

## 2015-09-01 NOTE — Patient Instructions (Signed)
Perform each exercise __10-15______ reps. 2-3x days.   Protraction - STANDING  Start by holding a wand or cane at chest height.  Next, slowly push the wand outwards in front of your body so that your elbows become fully straightened. Then, return to the original position.     Shoulder FLEXION - STANDING - PALMS UP  In the standing position, hold a wand/cane with both arms, palms up on both sides. Raise up the wand/cane allowing your unaffected arm to perform most of the effort. Your affected arm should be partially relaxed.      Internal/External ROTATION - STANDING  In the standing position, hold a wand/cane with both hands keeping your elbows bent. Move your arms and wand/cane to one side.  Your affected arm should be partially relaxed while your unaffected arm performs most of the effort.       Shoulder ABDUCTION - STANDING  While holding a wand/cane palm face up on the injured side and palm face down on the uninjured side, slowly raise up your injured arm to the side.                     Horizontal Abduction/Adduction      Straight arms holding cane at shoulder height, bring cane to right, center, left. Repeat starting to left.   Copyright  VHI. All rights reserved.      Thumb tacks:  Stand facing door with arms straight and thumbs on the doors. Twist thumbs in opposite directions for 1 minute.   Protraction/retraction/elevation/depression Stand facing door with palms flat on door and arms straight. Shrug shoulders, relax, squeeze shoulder blades together, relax. Continue for 1 minute.

## 2015-09-01 NOTE — Therapy (Addendum)
[name], [place], [address] Phone: [phone]   Fax:  [phone]  Occupational Therapy Treatment  Patient Details  Name: [name] MRN: [id number] Date of Birth: Apr 12, 1986 Referring Provider: Earlie Server, MD  Encounter Date: 09/01/2015      OT End of Session - 09/01/15 1026    Visit Number 2   Number of Visits 12   Date for OT Re-Evaluation 10/13/15  mini reassess: 09/11/15   Authorization Type Medicaid   Authorization Time Period 3 visits approved 08/17/15-11/13/2015   Authorization - Visit Number 1   Authorization - Number of Visits 3   OT Start Time 0930   OT Stop Time 1016   OT Time Calculation (min) 46 min   Activity Tolerance Patient tolerated treatment well   Behavior During Therapy [facility] for tasks assessed/performed      Past Medical History  Diagnosis Date  . Laryngotracheitis 06/20/2015    started antibiotic 06/20/2015  . Panic attacks   . History of MRSA infection     axilla  . Bipolar disorder ([place])   . Family history of adverse reaction to anesthesia     pt's father has hx. of being hard to wake up post-op  . Complication of anesthesia     hard to wake up post-op  . Shoulder dislocation 06/2015    right  . 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 Cesarean section  09/05/2008  . Hysteroscopy w/d&c  03/31/2012    Procedure: DILATATION AND CURETTAGE /HYSTEROSCOPY;  Surgeon: Allena Katz, MD;  Location: [facility];  Service: Gynecology;  Laterality: N/A;  . Laparoscopy  03/31/2012    Procedure: LAPAROSCOPY OPERATIVE;  Surgeon: Allena Katz, MD;  Location: [facility];  Service: Gynecology;  Laterality: N/A;  with Biopsies of Bilateral Fallopian Tubes; Fulguration of Endometriosis  . Laparoscopic assisted vaginal hysterectomy N/A 09/15/2012    Procedure: LAPAROSCOPIC ASSISTED VAGINAL HYSTERECTOMY;   Surgeon: Allena Katz, MD;  Location: [facility];  Service: Gynecology;  Laterality: N/A;  . Cystoscopy N/A 09/15/2012    Procedure: CYSTOSCOPY;  Surgeon: Allena Katz, MD;  Location: [facility];  Service: Gynecology;  Laterality: N/A;  . Dilation and evacuation  04/15/2007  . Ulnar nerve repair Right 05/2015  . Shoulder arthroscopy with capsulorrhaphy Right 06/28/2015    Procedure: RIGHT SHOULDER ARTHROSCOPY WITH CAPSULORRHAPHY;  Surgeon: Earlie Server, MD;  Location: [facility];  Service: Orthopedics;  Laterality: Right;    There were no vitals filed for this visit.  Visit Diagnosis:  Pain in joint of right shoulder  Shoulder stiffness, right  Tight fascia  Shoulder weakness      Subjective Assessment - 09/01/15 0930    Subjective  S: It's been very sore to the touch lately.    Currently in Pain? Yes   Pain Score 4    Pain Location Shoulder   Pain Orientation Right   Pain Descriptors / Indicators Aching   Pain Type Acute pain   Pain Radiating Towards towards elbow   Pain Onset More than a month ago   Pain Frequency Constant   Aggravating Factors  pushing/pulling   Pain Relieving Factors heat   Effect of Pain on Daily Activities limited ability to use during ADLs   Multiple Pain Sites No            [facility] OT  Assessment - 09/01/15 0929    Assessment   Diagnosis right shoulder athroscopy   Precautions   Precautions Shoulder   Type of Shoulder Precautions (phase II 4-8 weeks): 1/25-2/22: Increase ROM to tolerance. Begin gentle AA/ROM, A/ROM bicep. (Phase III 4-8 weeks): 2/22-3/22: Progress to full ROM without discomfort. A/ROM progressing to strengthening at 12 weeks.                     OT Treatments/Exercises (OP) - 09/01/15 0932    Exercises   Exercises Shoulder;Elbow   Shoulder Exercises: Supine   Protraction PROM;AAROM;10 reps   Horizontal ABduction PROM;AAROM;10 reps   External Rotation PROM;AAROM;10 reps   Internal Rotation  PROM;AAROM;10 reps   Flexion PROM;AAROM;10 reps   ABduction PROM;AAROM;10 reps   Shoulder Exercises: Standing   Protraction AAROM;10 reps   Horizontal ABduction AAROM;10 reps   External Rotation AAROM;10 reps   Internal Rotation AAROM;10 reps   Flexion AAROM;10 reps   ABduction AAROM;10 reps   Shoulder Exercises: Pulleys   Flexion 1 minute   ABduction 1 minute   Shoulder Exercises: ROM/Strengthening   Wall Wash 1'   Thumb Tacks 1'   Prot/Ret//Elev/Dep 1'   Manual Therapy   Manual Therapy Myofascial release   Manual therapy comments manual therapy completed prior to therapeutic exercises   Myofascial Release Myofascial release to right upper arm, trapezius, and scapularis regions to decrease pain and fascial restrictions and increase joint range of motion                OT Education - 09/01/15 1025    Education provided Yes   Education Details AA/ROM exercises, thumb tacks, prot/ret/elev/dep          OT Short Term Goals - 09/01/15 1029    OT SHORT TERM GOAL #1   Title Patient will be educated and independent with HEP to increase functional use of RUE during daily tasks.    Time 6   Period Weeks   Status On-going   OT SHORT TERM GOAL #2   Title Patient will increase P/ROM of RUE to [facility] to increase ability to get shirts on and off with less difficulty.    Time 6   Period Weeks   Status On-going   OT SHORT TERM GOAL #3   Title Patient will increase RUE strength to 3/5 to increase ability to complete tasks below shoulder level.   Time 6   Period Weeks   Status On-going   OT SHORT TERM GOAL #4   Title Patient will decrease pain level to 4/10 with RUE during daily tasks.    Time 6   Period Weeks   Status On-going   OT SHORT TERM GOAL #5   Title Patient will decrease fascial restrictions to mod amount to increase functional mobility of RUE.    Time 6   Period Weeks   Status On-going           OT Long Term Goals - 09/01/15 1029    OT LONG TERM GOAL #1    Title Patient will return to highest level of independence with all daily and work related tasks using [place].    Time 12   Period Weeks   Status On-going   OT LONG TERM GOAL #2   Title Patient will increase RUE strength to 4/5 to increase ability to return to work and complete tasks using RUE.    Time 12   Period Weeks   Status On-going  OT LONG TERM GOAL #3   Title Patient will increase A/ROM to [facility] to increase ability to complete overhead activities using RUE.    Time 12   Period Weeks   Status On-going   OT LONG TERM GOAL #4   Title patient will decrease pain level to 2/10 with use of RUE during daily and work related tasks.    Time 12   Period Weeks   Status On-going   OT LONG TERM GOAL #5   Title Patient will decrease fascial restrictions to min amount to increase functional mobility and decrease pain level in RUE.    Time 12   Period Weeks   Status On-going               Plan - 09/01/15 1026    Clinical Impression Statement A: Initiated myofascial release, manual stretching, AA/ROM exercises this session. Pt with increased soreness at beginning of session, increased fascial restrictions noted in left upper arm & trapezius regions. Pt required verbal cuing for form and technique during exercises, updated HEP for new phase.    Plan P: Follow up on AA/ROM exericses, progress to A/ROM. Provide updated HEP including A/ROM and shoulder stretches. Make one additional appt.         Problem List Patient Active Problem List   Diagnosis Date Noted  . Endometriosis 09/16/2012  . CERVICALGIA 09/08/2007  . CERVICAL SPASM 09/08/2007   Guadelupe Sabin, OTR/L  [phone]  09/01/2015, 10:29 AM  [facility] [place], [place], [address] Phone: [phone]   Fax:  [phone]  Name: [name] MRN: [id number] Date of Birth: 06/08/1986 OCCUPATIONAL THERAPY DISCHARGE SUMMARY  Visits from Start of Care:  2  Current functional level related to goals / functional outcomes: Pt only completed 2 visits and has not returned to therapy. All deficits remain. Pt was given a HEP to complete at home.     Plan: Patient agrees to discharge.  Patient goals were not met. Patient is being discharged due to not returning since the last visit.  ?????       Ailene Ravel, OTR/L,CBIS  [phone]

## 2015-11-10 ENCOUNTER — Telehealth (HOSPITAL_COMMUNITY): Payer: Self-pay

## 2015-11-10 ENCOUNTER — Ambulatory Visit (HOSPITAL_COMMUNITY): Payer: Medicaid Other | Admitting: Occupational Therapy

## 2015-11-10 NOTE — Telephone Encounter (Signed)
11/10/15 left message that emergency has come up so she cancelled today's appt.

## 2016-03-09 ENCOUNTER — Encounter (HOSPITAL_COMMUNITY): Payer: Self-pay | Admitting: Emergency Medicine

## 2016-03-09 ENCOUNTER — Emergency Department (HOSPITAL_COMMUNITY): Payer: Medicaid Other

## 2016-03-09 ENCOUNTER — Emergency Department (HOSPITAL_COMMUNITY)
Admission: EM | Admit: 2016-03-09 | Discharge: 2016-03-09 | Disposition: A | Discharge status: Home / Self Care | Payer: Medicaid Other | Attending: Emergency Medicine | Admitting: Emergency Medicine

## 2016-03-09 DIAGNOSIS — J209 Acute bronchitis, unspecified: Secondary | ICD-10-CM | POA: Diagnosis not present

## 2016-03-09 DIAGNOSIS — R05 Cough: Secondary | ICD-10-CM | POA: Diagnosis present

## 2016-03-09 DIAGNOSIS — F17210 Nicotine dependence, cigarettes, uncomplicated: Secondary | ICD-10-CM | POA: Diagnosis not present

## 2016-03-09 MED ORDER — PROMETHAZINE-CODEINE 6.25-10 MG/5ML PO SYRP
5.0000 mL | ORAL_SOLUTION | Freq: Once | ORAL | Status: AC
  Administered 2016-03-09: 5 mL via ORAL
  Filled 2016-03-09: qty 5

## 2016-03-09 MED ORDER — PREDNISONE 10 MG PO TABS: ORAL_TABLET | ORAL | 0 refills | Status: DC

## 2016-03-09 MED ORDER — AZITHROMYCIN 250 MG PO TABS: ORAL_TABLET | ORAL | 0 refills | Status: DC

## 2016-03-09 MED ORDER — KETOROLAC TROMETHAMINE 60 MG/2ML IM SOLN
60.0000 mg | Freq: Once | INTRAMUSCULAR | Status: AC
  Administered 2016-03-09: 60 mg via INTRAMUSCULAR
  Filled 2016-03-09: qty 2

## 2016-03-09 MED ORDER — PROMETHAZINE-DM 6.25-15 MG/5ML PO SYRP: 5.0000 mL | ORAL_SOLUTION | Freq: Four times a day (QID) | ORAL | 0 refills | Status: DC | PRN

## 2016-03-09 NOTE — Discharge Instructions (Signed)
Take the entire course of the antibiotics prescribed.  Prednisone will help with your bronchial and tracheal inflammation and pain.  You may take the cough syrup prescribed for pain and cough relief.  This will make you drowsy - do not drive within 4 hours of taking this medication.

## 2016-03-09 NOTE — ED Triage Notes (Signed)
Pt reports cough and burning in her chest that started yesterday. No fever.

## 2016-03-09 NOTE — ED Provider Notes (Signed)
[facility] Provider Note   CSN: [id number] Arrival date & time: 03/09/16  1640     History   Chief Complaint Chief Complaint  Patient presents with  . Cough    HPI [name] is a 30 y.o. female who describes prior episodes of bronchitis and tracheitis presenting with a 1 day history of nonproductive cough with burning in her upper mid sternum to her lower throat. Her pain started out with coughing only but is now constant, yet worsened with cough episodes.  She feels a tightness in her chest, denies wheezing, sob, fevers or chills.  Her son was diagnosed with a viral uri vs possible croup earlier this week.  She has had no medications for her symptoms prior to arrival.  She is a one ppd smoker.  The history is provided by the patient.    Past Medical History:  Diagnosis Date  . 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 Bipolar disorder (HCC)   . Complication of anesthesia    hard to wake up post-op  . Family history of adverse reaction to anesthesia    pt's father has hx. of being hard to wake up post-op  . History of MRSA infection    axilla  . 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 Panic attacks   . Shoulder dislocation 06/2015   right    Patient Active Problem List   Diagnosis Date Noted  . Endometriosis 09/16/2012  . CERVICALGIA 09/08/2007  . CERVICAL SPASM 09/08/2007    Past Surgical History:  Procedure Laterality Date  . CESAREAN SECTION  09/05/2008  . CYSTOSCOPY N/A 09/15/2012   Procedure: CYSTOSCOPY;  Surgeon: Leslie AndreaJames E Tomblin II, MD;  Location: [facility];  Service: Gynecology;  Laterality: N/A;  . DILATION AND EVACUATION  04/15/2007  . HYSTEROSCOPY W/D&C  03/31/2012   Procedure: DILATATION AND CURETTAGE /HYSTEROSCOPY;  Surgeon: Leslie AndreaJames E Tomblin II, MD;  Location: [facility];  Service: Gynecology;  Laterality: N/A;  . LAPAROSCOPIC ASSISTED VAGINAL HYSTERECTOMY N/A 09/15/2012   Procedure: LAPAROSCOPIC  ASSISTED VAGINAL HYSTERECTOMY;  Surgeon: Leslie AndreaJames E Tomblin II, MD;  Location: [facility];  Service: Gynecology;  Laterality: N/A;  . LAPAROSCOPY  03/31/2012   Procedure: LAPAROSCOPY OPERATIVE;  Surgeon: Leslie AndreaJames E Tomblin II, MD;  Location: [facility];  Service: Gynecology;  Laterality: N/A;  with Biopsies of Bilateral Fallopian Tubes; Fulguration of Endometriosis  . SHOULDER ARTHROSCOPY WITH CAPSULORRHAPHY Right 06/28/2015   Procedure: RIGHT SHOULDER ARTHROSCOPY WITH CAPSULORRHAPHY;  Surgeon: Frederico Hammananiel Caffrey, MD;  Location: [facility];  Service: Orthopedics;  Laterality: Right;  . ULNAR NERVE REPAIR Right 05/2015    OB History    Gravida Para Term Preterm AB Living   2 1 1   1 1    SAB TAB Ectopic Multiple Live Births   1               Home Medications    Prior to Admission medications   Medication Sig Start Date End Date Taking? Authorizing Provider  azithromycin (ZITHROMAX Z-PAK) 250 MG tablet Take 2 tablets by mouth on day one followed by one tablet daily for 4 days. 03/09/16   Burgess AmorJulie Lendy Dittrich, PA-C  lurasidone (LATUDA) 40 MG TABS tablet Take 40 mg by mouth at bedtime. Reported on 08/14/2015    Historical Provider, MD  methocarbamol (ROBAXIN-750) 750 MG tablet Take 1 tablet (750 mg total) by mouth every 6 (six) hours as needed for muscle spasms. Patient not taking: Reported on  08/14/2015 06/28/15   Margart Sickles, PA-C  oxyCODONE-acetaminophen (ROXICET) 5-325 MG tablet Take 1 tablet by mouth every 4 (four) hours as needed for moderate pain or severe pain (max 6 tabs daily). Patient not taking: Reported on 08/14/2015 06/28/15   Margart Sickles, PA-C  predniSONE (DELTASONE) 10 MG tablet 6, 5, 4, 3, 2 then 1 tablet by mouth daily for 6 days total. 03/09/16   Burgess Amor, PA-C  promethazine-dextromethorphan (PROMETHAZINE-DM) 6.25-15 MG/5ML syrup Take 5 mLs by mouth 4 (four) times daily as needed for cough. 03/09/16   Burgess Amor, PA-C    Family History Family History  Problem Relation Age of Onset    . Anesthesia problems Father     hard to wake up post-op    Social History Social History  Substance Use Topics  . Smoking status: Current Every Day Smoker    Packs/day: 1.00    Years: 10.00    Types: Cigarettes  . Smokeless tobacco: Never Used  . Alcohol use Yes     Comment: occasionally     Allergies   Dilaudid [hydromorphone hcl] and Adhesive [tape]   Review of Systems Review of Systems  Constitutional: Negative for fever.  HENT: Negative for congestion and sore throat.   Eyes: Negative.   Respiratory: Positive for cough and chest tightness. Negative for shortness of breath.   Cardiovascular: Positive for chest pain. Negative for palpitations and leg swelling.  Gastrointestinal: Negative for abdominal pain and nausea.  Genitourinary: Negative.   Musculoskeletal: Negative for arthralgias, joint swelling and neck pain.  Skin: Negative.  Negative for rash and wound.  Neurological: Negative for dizziness, weakness, light-headedness, numbness and headaches.  Psychiatric/Behavioral: Negative.      Physical Exam Updated Vital Signs BP (!) 121/49 (BP Location: Left Arm)   Pulse 99   Temp 98.1 F (36.7 C) (Oral)   Resp 22   Ht 5\' 6"  (1.676 m)   Wt 82.6 kg   LMP 08/18/2012   SpO2 99%   BMI 29.38 kg/m   Physical Exam  Constitutional: She appears well-developed and well-nourished.  Appears mildly anxious.  HENT:  Head: Normocephalic and atraumatic.  Eyes: Conjunctivae are normal.  Neck: Normal range of motion.  Cardiovascular: Normal rate, regular rhythm, normal heart sounds and intact distal pulses.   Pulmonary/Chest: Effort normal and breath sounds normal. She has no wheezes. She has no rales. She exhibits no tenderness.  No retractions, wheezing, stridor.    Abdominal: Soft. Bowel sounds are normal. There is no tenderness.  Musculoskeletal: Normal range of motion.  Neurological: She is alert.  Skin: Skin is warm and dry.  Psychiatric: She has a normal mood  and affect.  Nursing note and vitals reviewed.    ED Treatments / Results  Labs (all labs ordered are listed, but only abnormal results are displayed) Labs Reviewed - No data to display  EKG  EKG Interpretation None       Radiology Dg Chest 2 View  Result Date: 03/09/2016 CLINICAL DATA:  Nonproductive cough with body aches starting yesterday. EXAM: CHEST  2 VIEW COMPARISON:  02/26/2014 FINDINGS: The lungs are clear wiithout focal pneumonia, edema, pneumothorax or pleural effusion. The cardiopericardial silhouette is within normal limits for size. The visualized bony structures of the thorax are intact. IMPRESSION: Stable.  No acute findings. Electronically Signed   By: Kennith Center [name]   On: 03/09/2016 17:48    Procedures Procedures (including critical care time)  Medications Ordered in ED Medications  promethazine-codeine (PHENERGAN with  CODEINE) 6.25-10 MG/5ML syrup 5 mL (5 mLs Oral Given 03/09/16 1809)  ketorolac (TORADOL) injection 60 mg (60 mg Intramuscular Given 03/09/16 1810)     Initial Impression / Assessment and Plan / ED Course  I have reviewed the triage vital signs and the nursing notes.  Pertinent labs & imaging results that were available during my care of the patient were reviewed by me and considered in my medical decision making (see chart for details).  Clinical Course    Imaging reviewed and negative for pneumonia. Exam and history suggesting acute bronchitis/ tracheitis.  She was placed on prednisone, first dose given here.  Zpack,  Phenergan/codeine cough syrup.  Advised rest, increased fluid intake, recheck by pcp or return here for any worsened or persistent sx.    Smoking cessation info given pt.  Final Clinical Impressions(s) / ED Diagnoses   Final diagnoses:  Acute bronchitis, unspecified organism    New Prescriptions New Prescriptions   AZITHROMYCIN (ZITHROMAX Z-PAK) 250 MG TABLET    Take 2 tablets by mouth on day one followed by one  tablet daily for 4 days.   PREDNISONE (DELTASONE) 10 MG TABLET    6, 5, 4, 3, 2 then 1 tablet by mouth daily for 6 days total.   PROMETHAZINE-DEXTROMETHORPHAN (PROMETHAZINE-DM) 6.25-15 MG/5ML SYRUP    Take 5 mLs by mouth 4 (four) times daily as needed for cough.     Burgess Amor, PA-C 03/09/16 1839    Vanetta Mulders, MD 03/13/16 [phone]

## 2016-06-18 ENCOUNTER — Telehealth (HOSPITAL_COMMUNITY): Payer: Self-pay | Admitting: Family Medicine

## 2016-06-18 ENCOUNTER — Ambulatory Visit (HOSPITAL_COMMUNITY): Payer: Medicaid Other | Admitting: Physical Therapy

## 2016-06-18 ENCOUNTER — Encounter (HOSPITAL_COMMUNITY): Payer: Self-pay

## 2016-06-18 NOTE — Telephone Encounter (Signed)
Patient need to take care of her Dad.

## 2016-07-03 ENCOUNTER — Encounter (HOSPITAL_COMMUNITY): Payer: Self-pay | Admitting: Emergency Medicine

## 2016-07-03 ENCOUNTER — Emergency Department (HOSPITAL_COMMUNITY)
Admission: EM | Admit: 2016-07-03 | Discharge: 2016-07-03 | Disposition: A | Discharge status: Home / Self Care | Payer: Medicaid Other | Attending: Emergency Medicine | Admitting: Emergency Medicine

## 2016-07-03 DIAGNOSIS — K611 Rectal abscess: Secondary | ICD-10-CM | POA: Insufficient documentation

## 2016-07-03 DIAGNOSIS — K6289 Other specified diseases of anus and rectum: Secondary | ICD-10-CM | POA: Diagnosis present

## 2016-07-03 DIAGNOSIS — Z79899 Other long term (current) drug therapy: Secondary | ICD-10-CM | POA: Insufficient documentation

## 2016-07-03 DIAGNOSIS — F17210 Nicotine dependence, cigarettes, uncomplicated: Secondary | ICD-10-CM | POA: Diagnosis not present

## 2016-07-03 DIAGNOSIS — K612 Anorectal abscess: Secondary | ICD-10-CM

## 2016-07-03 HISTORY — DX: Post-traumatic stress disorder, unspecified: F43.10

## 2016-07-03 MED ORDER — DOCUSATE SODIUM 100 MG PO CAPS: 100.0000 mg | ORAL_CAPSULE | Freq: Two times a day (BID) | ORAL | 0 refills | Status: DC

## 2016-07-03 MED ORDER — DOXYCYCLINE HYCLATE 100 MG PO CAPS: 100.0000 mg | ORAL_CAPSULE | Freq: Two times a day (BID) | ORAL | 0 refills | Status: DC

## 2016-07-03 NOTE — Discharge Instructions (Signed)
Sit in warm bathtub 4 times daily for 30 minutes at a time. Take the antibiotic and stool softener as prescribed. Call Dr. Lovell SheehanJenkins to schedule an office appointment if not feeling better by next week

## 2016-07-03 NOTE — ED Provider Notes (Signed)
[facility] Provider Note   CSN: [id number] Arrival date & time: 07/03/16  1851     History   Chief Complaint Chief Complaint  Patient presents with  . Rectal Pain    HPI [name] is a 31 y.o. female.Plains of pain at her anus since this morning with a swollen area at anus. She's not had a bowel movement since having had the pain. Pain is improved with sitting in a warm tub made worse with anything she's not had a bowel movement since onset of pain. She treated self with tramadol, without relief. Denies fever denies abdominal pain denies other associated symptoms  HPI  Past Medical History:  Diagnosis Date  . 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 Bipolar disorder (HCC)   . Complication of anesthesia    hard to wake up post-op  . Family history of adverse reaction to anesthesia    pt's father has hx. of being hard to wake up post-op  . History of MRSA infection    axilla  . 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 Panic attacks   . PTSD (post-traumatic stress disorder)   . Shoulder dislocation 06/2015   right    Patient Active Problem List   Diagnosis Date Noted  . Endometriosis 09/16/2012  . CERVICALGIA 09/08/2007  . CERVICAL SPASM 09/08/2007    Past Surgical History:  Procedure Laterality Date  . CESAREAN SECTION  09/05/2008  . CYSTOSCOPY N/A 09/15/2012   Procedure: CYSTOSCOPY;  Surgeon: Leslie AndreaJames E Tomblin II, MD;  Location: [facility];  Service: Gynecology;  Laterality: N/A;  . DILATION AND EVACUATION  04/15/2007  . HYSTEROSCOPY W/D&C  03/31/2012   Procedure: DILATATION AND CURETTAGE /HYSTEROSCOPY;  Surgeon: Leslie AndreaJames E Tomblin II, MD;  Location: [facility];  Service: Gynecology;  Laterality: N/A;  . LAPAROSCOPIC ASSISTED VAGINAL HYSTERECTOMY N/A 09/15/2012   Procedure: LAPAROSCOPIC ASSISTED VAGINAL HYSTERECTOMY;  Surgeon: Leslie AndreaJames E Tomblin II, MD;  Location: [facility];  Service: Gynecology;  Laterality: N/A;  .  LAPAROSCOPY  03/31/2012   Procedure: LAPAROSCOPY OPERATIVE;  Surgeon: Leslie AndreaJames E Tomblin II, MD;  Location: [facility];  Service: Gynecology;  Laterality: N/A;  with Biopsies of Bilateral Fallopian Tubes; Fulguration of Endometriosis  . SHOULDER ARTHROSCOPY WITH CAPSULORRHAPHY Right 06/28/2015   Procedure: RIGHT SHOULDER ARTHROSCOPY WITH CAPSULORRHAPHY;  Surgeon: Frederico Hammananiel Caffrey, MD;  Location: [facility];  Service: Orthopedics;  Laterality: Right;  . ULNAR NERVE REPAIR Right 05/2015    OB History    Gravida Para Term Preterm AB Living   2 1 1   1 1    SAB TAB Ectopic Multiple Live Births   1               Home Medications    Prior to Admission medications   Medication Sig Start Date End Date Taking? Authorizing Provider  azithromycin (ZITHROMAX Z-PAK) 250 MG tablet Take 2 tablets by mouth on day one followed by one tablet daily for 4 days. 03/09/16   Burgess AmorJulie Idol, PA-C  docusate sodium (COLACE) 100 MG capsule Take 1 capsule (100 mg total) by mouth every 12 (twelve) hours. 07/03/16   Doug SouSam Lukka Black, MD  doxycycline (VIBRAMYCIN) 100 MG capsule Take 1 capsule (100 mg total) by mouth 2 (two) times daily. One po bid x 7 days 07/03/16   Doug SouSam Carlotta Telfair, MD  lurasidone (LATUDA) 40 MG TABS tablet Take 40 mg by mouth at bedtime. Reported on 08/14/2015    Historical Provider, MD  predniSONE (DELTASONE) 10 MG tablet 6, 5, 4, 3, 2 then 1 tablet by mouth daily for 6 days total. 03/09/16   Burgess Amor, PA-C  promethazine-dextromethorphan (PROMETHAZINE-DM) 6.25-15 MG/5ML syrup Take 5 mLs by mouth 4 (four) times daily as needed for cough. 03/09/16   Burgess Amor, PA-C    Family History Family History  Problem Relation Age of Onset  . Anesthesia problems Father     hard to wake up post-op    Social History Social History  Substance Use Topics  . Smoking status: Current Every Day Smoker    Packs/day: 1.00    Years: 10.00    Types: Cigarettes  . Smokeless tobacco: Never Used  . Alcohol use No      Comment: occasionally     Allergies   Dilaudid [hydromorphone hcl] and Adhesive [tape]   Review of Systems Review of Systems  Constitutional: Negative.   Gastrointestinal: Positive for rectal pain.     Physical Exam Updated Vital Signs BP 111/69 (BP Location: Left Arm)   Pulse 72   Temp 98.1 F (36.7 C) (Oral)   Resp 16   Ht 5\' 6"  (1.676 m)   Wt 191 lb (86.6 kg)   LMP 08/18/2012   SpO2 100%   BMI 30.83 kg/m   Physical Exam  Constitutional: She appears well-developed and well-nourished.  HENT:  Head: Normocephalic.  Eyes: EOM are normal.  Neck: Normal range of motion.  Cardiovascular: Normal rate.   Pulmonary/Chest: Effort normal.  Abdominal:  Obese  Genitourinary:  Genitourinary Comments: Perianal area with a 2 -3 millimeter diameter area at 10 o'clock which is exquisitely tender,, fluctuant.. No fissure seen.  Nursing note and vitals reviewed.    ED Treatments / Results  Labs (all labs ordered are listed, but only abnormal results are displayed) Labs Reviewed - No data to display  EKG  EKG Interpretation None       Radiology No results found.  Procedures Procedures (including critical care time)  Medications Ordered in ED Medications - No data to display   Initial Impression / Assessment and Plan / ED Course  I have reviewed the triage vital signs and the nursing notes.  Pertinent labs & imaging results that were available during my care of the patient were reviewed by me and considered in my medical decision making (see chart for details).  Clinical Course     Exam is consistent with tiny perianal abscess. I discussed incision and drainage. Patient would rather treat conservatively. I'm okay with that option Plan prescription doxycycline, Colace warm soaks., Referral Dr. Lovell Sheehan if not improved by next week  Final Clinical Impressions(s) / ED Diagnoses  Diagnosis perianal abscess Final diagnoses:  Abscess of anal and rectal regions     New Prescriptions New Prescriptions   DOCUSATE SODIUM (COLACE) 100 MG CAPSULE    Take 1 capsule (100 mg total) by mouth every 12 (twelve) hours.   DOXYCYCLINE (VIBRAMYCIN) 100 MG CAPSULE    Take 1 capsule (100 mg total) by mouth 2 (two) times daily. One po bid x 7 days     Doug Sou, MD 07/03/16 2010

## 2016-07-03 NOTE — ED Triage Notes (Signed)
Pt c/o rectal pain since this am.

## 2016-07-11 ENCOUNTER — Ambulatory Visit (HOSPITAL_COMMUNITY): Payer: Medicaid Other | Attending: Neurosurgery | Admitting: Physical Therapy

## 2016-07-11 ENCOUNTER — Telehealth (HOSPITAL_COMMUNITY): Payer: Self-pay | Admitting: Physical Therapy

## 2016-07-11 DIAGNOSIS — M6281 Muscle weakness (generalized): Secondary | ICD-10-CM | POA: Diagnosis present

## 2016-07-11 DIAGNOSIS — M542 Cervicalgia: Secondary | ICD-10-CM | POA: Diagnosis not present

## 2016-07-11 DIAGNOSIS — R293 Abnormal posture: Secondary | ICD-10-CM | POA: Insufficient documentation
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
## 2016-07-11 NOTE — Patient Instructions (Signed)
   SCAPULAR RETRACTIONS  Draw your shoulder blades back and down. It should feel like you are squeezing your shoulder blades together.  Repeat 10 times, twice a day.      SHOULDER ROLLS  Move your shoulders in a circular pattern as shown so that your are moving in an up, back and down direction. Perform small cicles if needed for comfort.  Repeat 10 times, twice a day.    SCAPULAR DEPRESSION  You may do one side at a time or both together. Sitting up staight, push your shoulder blades down into your back pockets.  Repeat 10 times, twice a day.     Cervical Isometrics  Press your head gently into your hand (5 lbs pressure) without moving your neck- you should provide enough resistance with your hand so that your head/neck does not move. A) Flexion (bending forward) B) Lateral flexion (side bending)   D) Extension (bending your neck backward)  Repeat 10 times each motion, with 1-2 second holds, twice a day.      TENNISBALL OS RELEASE  Lying on your back take the rolled up towel and place behind your neck, then place the tennis balls at the base of your skull. Set a timer for no more than 5 minutes; you may want to start with 1 minute and work your way up.  An alternative is to sit and gently roll a tennis ball along the sore muscles on the back of your neck to tolerance.  If you have any sharp pains, numbness or tingling, radiating pain, STOP.    RETRACTION / CHIN TUCK  Slowly draw your head back so that your ears line up with your shoulders. It should feel like you are making a double chin. Do not look up or down while doing this exercise, keep your chin/face on one plane.  Hold for 3-5 seconds, or longer if you are enjoying the stretch.  Repeat 10 times, twice a day.   Hold each position for 5 seconds and perform 2 sets of 10 repetitions.

## 2016-07-11 NOTE — Telephone Encounter (Signed)
Called patient and provided her with information regarding pro bono PT clinics in [place] and [place] that she will be able to use if she needs PT after her surgery. Advised that Medicaid will not pay for a second evaluation this year.   Nedra HaiKristen Berkley Cronkright PT, DPT [phone]

## 2016-07-22 ENCOUNTER — Other Ambulatory Visit: Payer: Self-pay | Admitting: Neurosurgery

## 2016-08-01 ENCOUNTER — Encounter (HOSPITAL_COMMUNITY): Payer: Self-pay

## 2016-08-01 ENCOUNTER — Encounter (HOSPITAL_COMMUNITY)
Admission: RE | Admit: 2016-08-01 | Discharge: 2016-08-01 | Disposition: A | Discharge status: Home / Self Care | Payer: Medicaid Other | Source: Ambulatory Visit | Attending: Neurosurgery | Admitting: Neurosurgery

## 2016-08-01 DIAGNOSIS — Z01812 Encounter for preprocedural laboratory examination: Secondary | ICD-10-CM | POA: Insufficient documentation

## 2016-08-01 DIAGNOSIS — G935 Compression of brain: Secondary | ICD-10-CM | POA: Insufficient documentation

## 2016-08-01 HISTORY — DX: Unspecified asthma, uncomplicated: J45.909

## 2016-08-01 LAB — BASIC METABOLIC PANEL
Anion gap: 7 (ref 5–15)
BUN: 7 mg/dL (ref 6–20)
CO2: 22 mmol/L (ref 22–32)
Calcium: 9 mg/dL (ref 8.9–10.3)
Chloride: 109 mmol/L (ref 101–111)
Creatinine, Ser: 0.67 mg/dL (ref 0.44–1.00)
GFR calc Af Amer: >60 mL/min (ref >60)
GFR calc non Af Amer: >60 mL/min (ref >60)
Glucose, Bld: 86 mg/dL (ref 65–99)
Potassium: 3.8 mmol/L (ref 3.5–5.1)
Sodium: 138 mmol/L (ref 135–145)

## 2016-08-01 LAB — CBC
HCT: 38.8 % (ref 36.0–46.0)
Hemoglobin: 13.5 g/dL (ref 12.0–15.0)
MCH: 32.1 pg (ref 26.0–34.0)
MCHC: 34.8 g/dL (ref 30.0–36.0)
MCV: 92.2 fL (ref 78.0–100.0)
Platelets: 201 10*3/uL (ref 150–400)
RBC: 4.21 MIL/uL (ref 3.87–5.11)
RDW: 12.6 % (ref 11.5–15.5)
WBC: 6.2 10*3/uL (ref 4.0–10.5)

## 2016-08-01 LAB — ABO/RH: ABO/RH(D): O POS

## 2016-08-01 LAB — TYPE AND SCREEN
ABO/RH(D): O POS
Antibody Screen: NEGATIVE

## 2016-08-01 LAB — SURGICAL PCR SCREEN
MRSA, PCR: NEGATIVE
Staphylococcus aureus: NEGATIVE

## 2016-08-01 MED ORDER — CHLORHEXIDINE GLUCONATE CLOTH 2 % EX PADS: 6.0000 | MEDICATED_PAD | Freq: Once | CUTANEOUS | Status: DC

## 2016-08-01 NOTE — Pre-Procedure Instructions (Signed)
Monica MontgomeryDanielle B [name]  08/01/2016      [facility] - [place], [place] - [address]  [place] [id number] Phone: [phone] Fax: [phone]    Your procedure is scheduled on Thursday February 8.  [facility]:30 A.M.  Call this number if you have problems the morning of surgery:  [phone]   Remember:  Do not eat food or drink liquids after midnight.  Take these medicines the morning of surgery with A SIP OF WATER: propranolol (Inderal), tramadol (ultram) if needed  7 days prior to surgery STOP taking any Aspirin, Aleve, Naproxen, Ibuprofen, Motrin, Advil, Goody's, BC's, all herbal medications, fish oil, and all vitamins    Do not wear jewelry, make-up or nail polish.  Do not wear lotions, powders, or perfumes, or deoderant.  Do not shave 48 hours prior to surgery.  Men may shave face and neck.  Do not bring valuables to the hospital.  [facility] is not responsible for any belongings or valuables.  Contacts, dentures or bridgework may not be worn into surgery.  Leave your suitcase in the car.  After surgery it may be brought to your room.  For patients admitted to the hospital, discharge time will be determined by your treatment team.  Patients discharged the day of surgery will not be allowed to drive home.    Special instructions:    [place]- Preparing For Surgery  Before surgery, you can play an important role. Because skin is not sterile, your skin needs to be as free of germs as possible. You can reduce the number of germs on your skin by washing with CHG (chlorahexidine gluconate) Soap before surgery.  CHG is an antiseptic cleaner which kills germs and bonds with the skin to continue killing germs even after washing.  Please do not use if you have an allergy to CHG or antibacterial soaps. If your skin becomes reddened/irritated stop using the CHG.  Do not shave (including legs and underarms) for at  least 48 hours prior to first CHG shower. It is OK to shave your face.  Please follow these instructions carefully.   1. Shower the NIGHT BEFORE SURGERY and the MORNING OF SURGERY with CHG.   2. If you chose to wash your hair, wash your hair first as usual with your normal shampoo.  3. After you shampoo, rinse your hair and body thoroughly to remove the shampoo.  4. Use CHG as you would any other liquid soap. You can apply CHG directly to the skin and wash gently with a scrungie or a clean washcloth.   5. Apply the CHG Soap to your body ONLY FROM THE NECK DOWN.  Do not use on open wounds or open sores. Avoid contact with your eyes, ears, mouth and genitals (private parts). Wash genitals (private parts) with your normal soap.  6. Wash thoroughly, paying special attention to the area where your surgery will be performed.  7. Thoroughly rinse your body with warm water from the neck down.  8. DO NOT shower/wash with your normal soap after using and rinsing off the CHG Soap.  9. Pat yourself dry with a CLEAN TOWEL.   10. Wear CLEAN PAJAMAS   11. Place CLEAN SHEETS on your bed the night of your first shower and DO NOT SLEEP WITH PETS.    Day of Surgery: Do not apply any deodorants/lotions. Please wear clean clothes to the hospital/surgery center.  Please read over the following fact sheets that you were given. MRSA Information

## 2016-08-01 NOTE — Progress Notes (Signed)
PCP: Wyvonnia Loraavid Tapper Pt denies cardiac hx, cardiologist, chest pain, shortness of breath or signs of infection at PAT appointment.   Pt states she has severe anxiety with panic attacks and takes propranolol for social anxiety.

## 2016-08-07 NOTE — Anesthesia Preprocedure Evaluation (Addendum)
Anesthesia Evaluation  Patient identified by MRN, date of birth, ID band Patient awake    Reviewed: Allergy & Precautions, H&P , NPO status , Patient's Chart, lab work & pertinent test results  Airway Mallampati: II  TM Distance: >3 FB Neck ROM: Full    Dental no notable dental hx. (+) Teeth Intact, Dental Advisory Given   Pulmonary asthma , Current Smoker,    Pulmonary exam normal breath sounds clear to auscultation       Cardiovascular Exercise Tolerance: Good negative cardio ROS   Rhythm:Regular Rate:Normal     Neuro/Psych Anxiety Bipolar Disorder Chiari malformation negative psych ROS   GI/Hepatic negative GI ROS, Neg liver ROS,   Endo/Other  negative endocrine ROS  Renal/GU negative Renal ROS  negative genitourinary   Musculoskeletal   Abdominal   Peds  Hematology negative hematology ROS (+)   Anesthesia Other Findings   Reproductive/Obstetrics negative OB ROS                            Anesthesia Physical Anesthesia Plan  ASA: II  Anesthesia Plan: General   Post-op Pain Management:    Induction: Intravenous  Airway Management Planned: Oral ETT  Additional Equipment: Arterial line  Intra-op Plan:   Post-operative Plan: Extubation in OR  Informed Consent: I have reviewed the patients History and Physical, chart, labs and discussed the procedure including the risks, benefits and alternatives for the proposed anesthesia with the patient or authorized representative who has indicated his/her understanding and acceptance.   Dental advisory given  Plan Discussed with: CRNA  Anesthesia Plan Comments:         Anesthesia Quick Evaluation

## 2016-08-08 ENCOUNTER — Inpatient Hospital Stay (HOSPITAL_COMMUNITY): Payer: Medicaid Other | Admitting: Certified Registered Nurse Anesthetist

## 2016-08-08 ENCOUNTER — Encounter (HOSPITAL_COMMUNITY): Payer: Self-pay | Admitting: *Deleted

## 2016-08-08 ENCOUNTER — Inpatient Hospital Stay (HOSPITAL_COMMUNITY)
Admission: RE | Admit: 2016-08-08 | Discharge: 2016-08-10 | DRG: 027 | Disposition: A | Discharge status: Home / Self Care | Payer: Medicaid Other | Source: Ambulatory Visit | Attending: Neurosurgery | Admitting: Neurosurgery

## 2016-08-08 ENCOUNTER — Encounter (HOSPITAL_COMMUNITY)
Admission: RE | Disposition: A | Discharge status: Home / Self Care | Payer: Self-pay | Source: Ambulatory Visit | Attending: Neurosurgery

## 2016-08-08 DIAGNOSIS — F17210 Nicotine dependence, cigarettes, uncomplicated: Secondary | ICD-10-CM | POA: Diagnosis not present

## 2016-08-08 DIAGNOSIS — G935 Compression of brain: Secondary | ICD-10-CM

## 2016-08-08 DIAGNOSIS — Z79899 Other long term (current) drug therapy: Secondary | ICD-10-CM

## 2016-08-08 DIAGNOSIS — Z8614 Personal history of Methicillin resistant Staphylococcus aureus infection: Secondary | ICD-10-CM | POA: Diagnosis not present

## 2016-08-08 DIAGNOSIS — F319 Bipolar disorder, unspecified: Secondary | ICD-10-CM | POA: Diagnosis present

## 2016-08-08 HISTORY — DX: Compression of brain: G93.5

## 2016-08-08 HISTORY — PX: SUBOCCIPITAL CRANIECTOMY CERVICAL LAMINECTOMY: SHX5404

## 2016-08-08 LAB — MRSA PCR SCREENING: MRSA by PCR: NEGATIVE

## 2016-08-08 SURGERY — SUBOCCIPITAL CRANIECTOMY CERVICAL LAMINECTOMY/DURAPLASTY
Anesthesia: General | Site: Neck

## 2016-08-08 MED ORDER — SCOPOLAMINE 1 MG/3DAYS TD PT72
MEDICATED_PATCH | TRANSDERMAL | Status: AC
  Filled 2016-08-08: qty 1

## 2016-08-08 MED ORDER — ONDANSETRON HCL 4 MG/2ML IJ SOLN
INTRAMUSCULAR | Status: AC
  Filled 2016-08-08: qty 2

## 2016-08-08 MED ORDER — FENTANYL CITRATE (PF) 100 MCG/2ML IJ SOLN
INTRAMUSCULAR | Status: AC
  Filled 2016-08-08: qty 4

## 2016-08-08 MED ORDER — SODIUM CHLORIDE 0.9 % IV SOLN
500.0000 mg | Freq: Two times a day (BID) | INTRAVENOUS | Status: DC
  Filled 2016-08-08: qty 5

## 2016-08-08 MED ORDER — SODIUM CHLORIDE 0.9 % IV SOLN
500.0000 mg | Freq: Two times a day (BID) | INTRAVENOUS | Status: DC
  Administered 2016-08-08: 500 mg via INTRAVENOUS
  Filled 2016-08-08 (×2): qty 5

## 2016-08-08 MED ORDER — SODIUM CHLORIDE 0.9 % IR SOLN
Status: DC | PRN
  Administered 2016-08-08: 500 mL

## 2016-08-08 MED ORDER — MIDAZOLAM HCL 5 MG/5ML IJ SOLN
INTRAMUSCULAR | Status: DC | PRN
  Administered 2016-08-08: 2 mg via INTRAVENOUS

## 2016-08-08 MED ORDER — PROPRANOLOL HCL 10 MG PO TABS
10.0000 mg | ORAL_TABLET | Freq: Once | ORAL | Status: AC
  Administered 2016-08-08: 10 mg via ORAL
  Filled 2016-08-08: qty 1

## 2016-08-08 MED ORDER — BUPIVACAINE-EPINEPHRINE 0.5% -1:200000 IJ SOLN
INTRAMUSCULAR | Status: DC | PRN
Start: 2016-08-08 — End: 2016-08-08
  Administered 2016-08-08: 10 mL

## 2016-08-08 MED ORDER — BUPIVACAINE-EPINEPHRINE (PF) 0.5% -1:200000 IJ SOLN
INTRAMUSCULAR | Status: AC
  Filled 2016-08-08: qty 30

## 2016-08-08 MED ORDER — LIDOCAINE-EPINEPHRINE (PF) 2 %-1:200000 IJ SOLN
INTRAMUSCULAR | Status: AC
  Filled 2016-08-08: qty 20

## 2016-08-08 MED ORDER — ROCURONIUM BROMIDE 100 MG/10ML IV SOLN
INTRAVENOUS | Status: DC | PRN
  Administered 2016-08-08: 10 mg via INTRAVENOUS
  Administered 2016-08-08: 50 mg via INTRAVENOUS
  Administered 2016-08-08: 20 mg via INTRAVENOUS
  Administered 2016-08-08: 10 mg via INTRAVENOUS

## 2016-08-08 MED ORDER — THROMBIN 5000 UNITS EX SOLR
CUTANEOUS | Status: AC
  Filled 2016-08-08: qty 5000

## 2016-08-08 MED ORDER — DEXAMETHASONE SODIUM PHOSPHATE 4 MG/ML IJ SOLN: 4.0000 mg | Freq: Three times a day (TID) | INTRAMUSCULAR | Status: DC

## 2016-08-08 MED ORDER — DEXAMETHASONE SODIUM PHOSPHATE 10 MG/ML IJ SOLN
INTRAMUSCULAR | Status: AC
  Filled 2016-08-08: qty 1

## 2016-08-08 MED ORDER — ACETAMINOPHEN 650 MG RE SUPP: 650.0000 mg | RECTAL | Status: DC | PRN

## 2016-08-08 MED ORDER — ACETAMINOPHEN 325 MG PO TABS: 650.0000 mg | ORAL_TABLET | ORAL | Status: DC | PRN

## 2016-08-08 MED ORDER — THROMBIN 5000 UNITS EX SOLR
OROMUCOSAL | Status: DC | PRN
  Administered 2016-08-08: 5 mL via TOPICAL

## 2016-08-08 MED ORDER — NICOTINE 14 MG/24HR TD PT24
14.0000 mg | MEDICATED_PATCH | Freq: Every day | TRANSDERMAL | Status: DC | PRN
  Filled 2016-08-08: qty 1

## 2016-08-08 MED ORDER — SODIUM CHLORIDE 0.9 % IV SOLN
500.0000 mg | INTRAVENOUS | Status: AC
  Administered 2016-08-08: 500 mg via INTRAVENOUS
  Filled 2016-08-08: qty 5

## 2016-08-08 MED ORDER — HYDROMORPHONE HCL 1 MG/ML IJ SOLN: 0.2500 mg | INTRAMUSCULAR | Status: DC | PRN

## 2016-08-08 MED ORDER — 0.9 % SODIUM CHLORIDE (POUR BTL) OPTIME
TOPICAL | Status: DC | PRN
  Administered 2016-08-08 (×3): 1000 mL

## 2016-08-08 MED ORDER — HEMOSTATIC AGENTS (NO CHARGE) OPTIME
TOPICAL | Status: DC | PRN
  Administered 2016-08-08 (×2): 1 via TOPICAL

## 2016-08-08 MED ORDER — HYDROCODONE-ACETAMINOPHEN 5-325 MG PO TABS
1.0000 | ORAL_TABLET | ORAL | Status: DC | PRN
  Administered 2016-08-08 – 2016-08-10 (×9): 2 via ORAL
  Filled 2016-08-08 (×9): qty 2
  Filled 2016-08-08: qty 1
  Filled 2016-08-08: qty 2

## 2016-08-08 MED ORDER — DEXAMETHASONE SODIUM PHOSPHATE 10 MG/ML IJ SOLN
INTRAMUSCULAR | Status: DC | PRN
  Administered 2016-08-08: 10 mg via INTRAVENOUS

## 2016-08-08 MED ORDER — LACTATED RINGERS IV SOLN
INTRAVENOUS | Status: DC | PRN
  Administered 2016-08-08: 07:00:00 via INTRAVENOUS

## 2016-08-08 MED ORDER — MIDAZOLAM HCL 2 MG/2ML IJ SOLN
INTRAMUSCULAR | Status: AC
  Filled 2016-08-08: qty 2

## 2016-08-08 MED ORDER — IBUPROFEN 200 MG PO TABS
600.0000 mg | ORAL_TABLET | Freq: Two times a day (BID) | ORAL | Status: DC | PRN
  Filled 2016-08-08: qty 1

## 2016-08-08 MED ORDER — LABETALOL HCL 5 MG/ML IV SOLN: 10.0000 mg | INTRAVENOUS | Status: DC | PRN

## 2016-08-08 MED ORDER — CEFAZOLIN SODIUM-DEXTROSE 2-4 GM/100ML-% IV SOLN
INTRAVENOUS | Status: AC
  Filled 2016-08-08: qty 100

## 2016-08-08 MED ORDER — LIDOCAINE 2% (20 MG/ML) 5 ML SYRINGE
INTRAMUSCULAR | Status: AC
  Filled 2016-08-08: qty 10

## 2016-08-08 MED ORDER — ROCURONIUM BROMIDE 50 MG/5ML IV SOSY
PREFILLED_SYRINGE | INTRAVENOUS | Status: AC
  Filled 2016-08-08: qty 15

## 2016-08-08 MED ORDER — HYDROMORPHONE HCL 1 MG/ML IJ SOLN
INTRAMUSCULAR | Status: AC
  Administered 2016-08-08: 0.25 mg
  Filled 2016-08-08: qty 1

## 2016-08-08 MED ORDER — LIDOCAINE HCL (CARDIAC) 20 MG/ML IV SOLN
INTRAVENOUS | Status: DC | PRN
  Administered 2016-08-08: 60 mg via INTRAVENOUS

## 2016-08-08 MED ORDER — THROMBIN 20000 UNITS EX SOLR
CUTANEOUS | Status: AC
  Filled 2016-08-08: qty 20000

## 2016-08-08 MED ORDER — DEXMEDETOMIDINE HCL IN NACL 200 MCG/50ML IV SOLN
INTRAVENOUS | Status: DC | PRN
  Administered 2016-08-08: .3 ug/kg/h via INTRAVENOUS

## 2016-08-08 MED ORDER — DEXAMETHASONE SODIUM PHOSPHATE 4 MG/ML IJ SOLN
4.0000 mg | Freq: Four times a day (QID) | INTRAMUSCULAR | Status: DC
  Administered 2016-08-09 – 2016-08-10 (×3): 4 mg via INTRAVENOUS
  Filled 2016-08-08 (×3): qty 1

## 2016-08-08 MED ORDER — CEFAZOLIN SODIUM-DEXTROSE 2-4 GM/100ML-% IV SOLN
2.0000 g | INTRAVENOUS | Status: AC
  Administered 2016-08-08: 2 g via INTRAVENOUS

## 2016-08-08 MED ORDER — PROPRANOLOL HCL 10 MG PO TABS
10.0000 mg | ORAL_TABLET | Freq: Two times a day (BID) | ORAL | Status: DC
  Administered 2016-08-08 – 2016-08-10 (×4): 10 mg via ORAL
  Filled 2016-08-08 (×4): qty 1

## 2016-08-08 MED ORDER — PANTOPRAZOLE SODIUM 40 MG IV SOLR
40.0000 mg | Freq: Every day | INTRAVENOUS | Status: DC
  Administered 2016-08-08: 40 mg via INTRAVENOUS
  Filled 2016-08-08: qty 40

## 2016-08-08 MED ORDER — ONDANSETRON HCL 4 MG/2ML IJ SOLN: 4.0000 mg | INTRAMUSCULAR | Status: DC | PRN

## 2016-08-08 MED ORDER — BACITRACIN ZINC 500 UNIT/GM EX OINT
TOPICAL_OINTMENT | CUTANEOUS | Status: AC
  Filled 2016-08-08: qty 28.35

## 2016-08-08 MED ORDER — POTASSIUM CHLORIDE IN NACL 20-0.9 MEQ/L-% IV SOLN
INTRAVENOUS | Status: DC
  Administered 2016-08-08 – 2016-08-09 (×2): via INTRAVENOUS
  Filled 2016-08-08 (×3): qty 1000

## 2016-08-08 MED ORDER — HYDROMORPHONE HCL 1 MG/ML IJ SOLN
0.2500 mg | INTRAMUSCULAR | Status: DC | PRN
  Administered 2016-08-08: 0.25 mg via INTRAVENOUS

## 2016-08-08 MED ORDER — ONDANSETRON HCL 4 MG/2ML IJ SOLN
INTRAMUSCULAR | Status: DC | PRN
  Administered 2016-08-08: 4 mg via INTRAVENOUS

## 2016-08-08 MED ORDER — ONDANSETRON HCL 4 MG PO TABS: 4.0000 mg | ORAL_TABLET | ORAL | Status: DC | PRN

## 2016-08-08 MED ORDER — EPHEDRINE 5 MG/ML INJ
INTRAVENOUS | Status: AC
  Filled 2016-08-08: qty 20

## 2016-08-08 MED ORDER — DOCUSATE SODIUM 100 MG PO CAPS
100.0000 mg | ORAL_CAPSULE | Freq: Two times a day (BID) | ORAL | Status: DC
  Administered 2016-08-08 – 2016-08-10 (×4): 100 mg via ORAL
  Filled 2016-08-08 (×4): qty 1

## 2016-08-08 MED ORDER — SERTRALINE HCL 50 MG PO TABS
50.0000 mg | ORAL_TABLET | Freq: Every day | ORAL | Status: DC
  Administered 2016-08-08 – 2016-08-09 (×2): 50 mg via ORAL
  Filled 2016-08-08 (×2): qty 1

## 2016-08-08 MED ORDER — SCOPOLAMINE 1 MG/3DAYS TD PT72
1.0000 | MEDICATED_PATCH | TRANSDERMAL | Status: DC
  Administered 2016-08-08: 1.5 mg via TRANSDERMAL
  Filled 2016-08-08: qty 1

## 2016-08-08 MED ORDER — TRAMADOL HCL 50 MG PO TABS: 50.0000 mg | ORAL_TABLET | Freq: Two times a day (BID) | ORAL | Status: DC | PRN

## 2016-08-08 MED ORDER — BISACODYL 10 MG RE SUPP: 10.0000 mg | Freq: Every day | RECTAL | Status: DC | PRN

## 2016-08-08 MED ORDER — SUGAMMADEX SODIUM 200 MG/2ML IV SOLN
INTRAVENOUS | Status: DC | PRN
  Administered 2016-08-08: 200 mg via INTRAVENOUS

## 2016-08-08 MED ORDER — FENTANYL CITRATE (PF) 100 MCG/2ML IJ SOLN
INTRAMUSCULAR | Status: DC | PRN
  Administered 2016-08-08: 100 ug via INTRAVENOUS
  Administered 2016-08-08: 50 ug via INTRAVENOUS
  Administered 2016-08-08 (×2): 100 ug via INTRAVENOUS
  Administered 2016-08-08: 50 ug via INTRAVENOUS

## 2016-08-08 MED ORDER — THROMBIN 20000 UNITS EX SOLR
OROMUCOSAL | Status: DC | PRN
  Administered 2016-08-08: 20 mL via TOPICAL

## 2016-08-08 MED ORDER — DEXAMETHASONE SODIUM PHOSPHATE 10 MG/ML IJ SOLN
6.0000 mg | Freq: Four times a day (QID) | INTRAMUSCULAR | Status: AC
  Administered 2016-08-08 – 2016-08-09 (×4): 6 mg via INTRAVENOUS
  Filled 2016-08-08 (×4): qty 1

## 2016-08-08 MED ORDER — EPHEDRINE SULFATE 50 MG/ML IJ SOLN
INTRAMUSCULAR | Status: DC | PRN
  Administered 2016-08-08: 10 mg via INTRAVENOUS
  Administered 2016-08-08: 5 mg via INTRAVENOUS

## 2016-08-08 MED ORDER — BACITRACIN ZINC 500 UNIT/GM EX OINT
TOPICAL_OINTMENT | CUTANEOUS | Status: DC | PRN
  Administered 2016-08-08: 1 via TOPICAL

## 2016-08-08 MED ORDER — PROPOFOL 10 MG/ML IV BOLUS
INTRAVENOUS | Status: AC
  Filled 2016-08-08: qty 40

## 2016-08-08 MED ORDER — PROPOFOL 10 MG/ML IV BOLUS
INTRAVENOUS | Status: DC | PRN
  Administered 2016-08-08: 100 mg via INTRAVENOUS
  Administered 2016-08-08 (×2): 50 mg via INTRAVENOUS
  Administered 2016-08-08: 150 mg via INTRAVENOUS
  Administered 2016-08-08: 50 mg via INTRAVENOUS

## 2016-08-08 MED ORDER — PROMETHAZINE HCL 12.5 MG PO TABS
12.5000 mg | ORAL_TABLET | ORAL | Status: DC | PRN
  Filled 2016-08-08: qty 2

## 2016-08-08 MED ORDER — CEFAZOLIN SODIUM-DEXTROSE 2-4 GM/100ML-% IV SOLN
2.0000 g | Freq: Three times a day (TID) | INTRAVENOUS | Status: AC
  Administered 2016-08-08 (×2): 2 g via INTRAVENOUS
  Filled 2016-08-08 (×2): qty 100

## 2016-08-08 SURGICAL SUPPLY — 73 items
APL SKNCLS STERI-STRIP NONHPOA (GAUZE/BANDAGES/DRESSINGS) ×1
APL SRG 60D 8 XTD TIP BNDBL (TIP) ×2
BAG DECANTER FOR FLEXI CONT (MISCELLANEOUS) ×3 IMPLANT
BENZOIN TINCTURE PRP APPL 2/3 (GAUZE/BANDAGES/DRESSINGS) ×3 IMPLANT
BLADE CLIPPER SPEC (BLADE) ×3 IMPLANT
BLADE SURG 15 STRL LF DISP TIS (BLADE) IMPLANT
BLADE SURG 15 STRL SS (BLADE) ×3
BLADE ULTRA TIP 2M (BLADE) ×2 IMPLANT
BUR MATCHSTICK NEURO 3.0 LAGG (BURR) ×3 IMPLANT
BUR PRECISION FLUTE 6.0 (BURR) ×3 IMPLANT
CANISTER SUCT 3000ML PPV (MISCELLANEOUS) ×3 IMPLANT
CARTRIDGE OIL MAESTRO DRILL (MISCELLANEOUS) ×1 IMPLANT
CLIP TI MEDIUM 6 (CLIP) IMPLANT
COVER MAYO STAND STRL (DRAPES) IMPLANT
DIFFUSER DRILL AIR PNEUMATIC (MISCELLANEOUS) ×3 IMPLANT
DRAPE LAPAROTOMY 100X72 PEDS (DRAPES) ×3 IMPLANT
DRAPE MICROSCOPE LEICA (MISCELLANEOUS) IMPLANT
DRAPE SURG 17X23 STRL (DRAPES) ×8 IMPLANT
DRAPE WARM FLUID 44X44 (DRAPE) ×3 IMPLANT
DURASEAL APPLICATOR TIP (TIP) ×4 IMPLANT
DURASEAL SPINE SEALANT 3ML (MISCELLANEOUS) ×4 IMPLANT
ELECT BLADE 4.0 EZ CLEAN MEGAD (MISCELLANEOUS) ×3
ELECT REM PT RETURN 9FT ADLT (ELECTROSURGICAL) ×3
ELECTRODE BLDE 4.0 EZ CLN MEGD (MISCELLANEOUS) ×1 IMPLANT
ELECTRODE REM PT RTRN 9FT ADLT (ELECTROSURGICAL) ×1 IMPLANT
EVACUATOR 1/8 PVC DRAIN (DRAIN) IMPLANT
EVACUATOR SILICONE 100CC (DRAIN) IMPLANT
GAUZE SPONGE 4X4 12PLY STRL (GAUZE/BANDAGES/DRESSINGS) ×2 IMPLANT
GAUZE SPONGE 4X4 16PLY XRAY LF (GAUZE/BANDAGES/DRESSINGS) IMPLANT
GLOVE BIO SURGEON STRL SZ8 (GLOVE) ×3 IMPLANT
GLOVE BIO SURGEON STRL SZ8.5 (GLOVE) ×3 IMPLANT
GLOVE BIOGEL PI IND STRL 6.5 (GLOVE) IMPLANT
GLOVE BIOGEL PI IND STRL 7.0 (GLOVE) IMPLANT
GLOVE BIOGEL PI INDICATOR 6.5 (GLOVE) ×2
GLOVE BIOGEL PI INDICATOR 7.0 (GLOVE) ×4
GLOVE EXAM NITRILE LRG STRL (GLOVE) IMPLANT
GLOVE EXAM NITRILE XL STR (GLOVE) IMPLANT
GLOVE EXAM NITRILE XS STR PU (GLOVE) IMPLANT
GLOVE SURG SS PI 6.5 STRL IVOR (GLOVE) ×4 IMPLANT
GOWN STRL REUS W/ TWL LRG LVL3 (GOWN DISPOSABLE) IMPLANT
GOWN STRL REUS W/ TWL XL LVL3 (GOWN DISPOSABLE) IMPLANT
GOWN STRL REUS W/TWL LRG LVL3 (GOWN DISPOSABLE) ×3
GOWN STRL REUS W/TWL XL LVL3 (GOWN DISPOSABLE) ×3
GRAFT SUTURABLE BP 6CMX8CM (Tissue) ×2 IMPLANT
KIT BASIN OR (CUSTOM PROCEDURE TRAY) ×3 IMPLANT
KIT ROOM TURNOVER OR (KITS) ×3 IMPLANT
MARKER SKIN DUAL TIP RULER LAB (MISCELLANEOUS) ×3 IMPLANT
NEEDLE HYPO 22GX1.5 SAFETY (NEEDLE) ×3 IMPLANT
NS IRRIG 1000ML POUR BTL (IV SOLUTION) ×7 IMPLANT
OIL CARTRIDGE MAESTRO DRILL (MISCELLANEOUS) ×3
PACK CRANIOTOMY (CUSTOM PROCEDURE TRAY) ×3 IMPLANT
PAD ARMBOARD 7.5X6 YLW CONV (MISCELLANEOUS) ×15 IMPLANT
PATTIES SURGICAL 1/4 X 3 (GAUZE/BANDAGES/DRESSINGS) IMPLANT
PIN MAYFIELD SKULL DISP (PIN) ×3 IMPLANT
RUBBERBAND STERILE (MISCELLANEOUS) IMPLANT
SEALANT ADHERUS EXTEND TIP (MISCELLANEOUS) ×1 IMPLANT
SPONGE LAP 4X18 X RAY DECT (DISPOSABLE) IMPLANT
STAPLER SKIN PROX WIDE 3.9 (STAPLE) ×2 IMPLANT
SUT ETHILON 2 0 FS 18 (SUTURE) ×2 IMPLANT
SUT ETHILON 2 0 PSLX (SUTURE) IMPLANT
SUT ETHILON 3 0 FSL (SUTURE) IMPLANT
SUT NURALON 4 0 TR CR/8 (SUTURE) ×6 IMPLANT
SUT PROLENE 6 0 BV (SUTURE) ×12 IMPLANT
SUT VIC AB 1 CT1 18XBRD ANBCTR (SUTURE) ×1 IMPLANT
SUT VIC AB 1 CT1 8-18 (SUTURE) ×6
SUT VIC AB 2-0 CP2 18 (SUTURE) ×5 IMPLANT
SUT VIC AB 3-0 SH 8-18 (SUTURE) ×3 IMPLANT
TAPE CLOTH SURG 4X10 WHT LF (GAUZE/BANDAGES/DRESSINGS) ×3 IMPLANT
TOWEL OR 17X24 6PK STRL BLUE (TOWEL DISPOSABLE) ×2 IMPLANT
TOWEL OR 17X26 10 PK STRL BLUE (TOWEL DISPOSABLE) ×3 IMPLANT
TRAY FOLEY W/METER SILVER 16FR (SET/KITS/TRAYS/PACK) ×3 IMPLANT
UNDERPAD 30X30 (UNDERPADS AND DIAPERS) IMPLANT
WATER STERILE IRR 1000ML POUR (IV SOLUTION) ×3 IMPLANT

## 2016-08-08 NOTE — Anesthesia Procedure Notes (Signed)
Procedure Name: Intubation Date/Time: 08/08/2016 7:36 AM Performed by: Faustino CongressWHITE, Boykin Baetz TENA Elienai Gailey Pre-anesthesia Checklist: Patient identified, Emergency Drugs available, Suction available and Patient being monitored Patient Re-evaluated:Patient Re-evaluated prior to inductionOxygen Delivery Method: Circle system utilized Preoxygenation: Pre-oxygenation with 100% oxygen Intubation Type: IV induction Ventilation: Mask ventilation without difficulty Laryngoscope Size: Miller and 1 Grade View: Grade I Tube type: Oral Tube size: 7.0 mm Number of attempts: 1 Airway Equipment and Method: Stylet Placement Confirmation: ETT inserted through vocal cords under direct vision,  positive ETCO2 and breath sounds checked- equal and bilateral Secured at: 23 cm Tube secured with: Tape Dental Injury: Teeth and Oropharynx as per pre-operative assessment  Comments: Intubation done by C. Sheliah HatchWarner, SRNA supervised by MDA.

## 2016-08-08 NOTE — Op Note (Signed)
Brief history: The patient is a 31 year old white female who has complained of neck pain and headaches. She has failed medical management and was worked up with a brain MRI. This demonstrated a Chiari I malformation. I discussed the various treatment options with the patient and her mother including surgery. The patient has weighed the risks, benefits, and alternatives to surgery and decided proceed with a Chiari decompression.  Preop diagnosis: Chiari 1 malformation, headaches, cervicalgia  Postop diagnosis: The same  Procedure: Suboccipital craniectomy, C1 laminectomy, duraplasty  Surgeon: Dr. Delma OfficerJeff Jamerica Snavely  Assistant: None  Anesthesia: Gen. endotracheal  Estimated blood loss: 100 mL  Specimens: None  Drains: None  Complications: None  Description of procedure: The patient was brought to the operating room by the anesthesia team. General endotracheal anesthesia was induced. I applied the Mayfield 3. head rested patient's calvarium. The patient was then turned to the prone position on the chest rolls. Her suboccipital was then shaved with clippers in this region as well as the posterior neck and thorax was prepared with Betadine scrub and Betadine solution. Sterile drapes were applied. I then injected the area to be incised with Marcaine and epinephrine solution. I discussed all to make a midline incision over the occipital cervical junction. I used the cautery to perform a bilateral subperiosteal dissection exposing the spinous process and lamina of C1 and C2 and the suboccipital skull. We used the cerebellar retractor for exposure. I then used a high-speed drill to thin out the suboccipital bone at the foramen magnum and to thin out the posterior C1 arch. We used Accurso punches to complete the C1 laminectomy and suboccipital craniectomy. I then incised the dura with a 15 by scalpel and the [facility] elevator in a Y-shaped fashion tacking up the dural edges. The arachnoid was largely intact. We  could see the Chiari malformation through the arachnoid. I then performed a duraplasty with Dura-Guard using interrupted and running 6-0 Prolene sutures. We then had anesthesia Valsalva the patient the duraplasty appeared watertight. I then placed DuraSeal over the duraplasty. We obtained hemostasis using bipolar electrocautery. We then removed the retractor and then reapproximated patient's occipital cervical fascia with interrupted 0 Vicryl suture. We were approximated subcutaneous tissue interrupted 2-0 Vicryl suture. We reapproximate the skin with a running 2-0 nylon suture. The wound was then coated with bacitracin ointment. A sterile dressing was applied. The drapes were removed. The patient was returned to the supine position. I then removed the Mayfield 3. headrest from the patient's calvarium. By report all sponge, instrument, and needle count were correct at the end of this case.

## 2016-08-08 NOTE — Transfer of Care (Signed)
Immediate Anesthesia Transfer of Care Note  Patient: Monica MontgomeryDanielle B [name]  Procedure(s) Performed: Procedure(s) with comments: SUBOCCIPITAL CRANIECTOMY CERVICAL LAMINECTOMY NUMBER DURAPLASTY (N/A) - suboccipital  Patient Location: PACU  Anesthesia Type:General  Level of Consciousness: lethargic and responds to stimulation, moving all extremities  Airway & Oxygen Therapy: Patient Spontanous Breathing and Patient connected to nasal cannula oxygen  Post-op Assessment: Report given to RN and Post -op Vital signs reviewed and stable  Post vital signs: Reviewed and stable  Last Vitals:  Vitals:   08/08/16 0542  BP: 116/68  Pulse: 64  Resp: 18  Temp: 36.7 C    Last Pain:  Vitals:   08/08/16 0542  TempSrc: Oral         Complications: No apparent anesthesia complications

## 2016-08-08 NOTE — Anesthesia Postprocedure Evaluation (Signed)
Anesthesia Post Note  Patient: Jeneen MontgomeryDanielle B Sandhu  Procedure(s) Performed: Procedure(s) (LRB): SUBOCCIPITAL CRANIECTOMY CERVICAL LAMINECTOMY NUMBER DURAPLASTY (N/A)  Patient location during evaluation: PACU Anesthesia Type: General Level of consciousness: awake and alert Pain management: pain level controlled Vital Signs Assessment: post-procedure vital signs reviewed and stable Respiratory status: spontaneous breathing, nonlabored ventilation and respiratory function stable Cardiovascular status: blood pressure returned to baseline and stable Postop Assessment: no signs of nausea or vomiting Anesthetic complications: no       Last Vitals:  Vitals:   08/08/16 1200 08/08/16 1300  BP:  107/67  Pulse: (!) 59 64  Resp: 15 15  Temp: 36.3 C     Last Pain:  Vitals:   08/08/16 1200  TempSrc:   PainSc: 2                  Ashad Fawbush,W. EDMOND

## 2016-08-08 NOTE — Progress Notes (Signed)
Order clarified with Dr Lacretia NicksW Fitzgerald-OK to give IV Dilaudid as ord. for pain.

## 2016-08-08 NOTE — H&P (Signed)
Subjective: Is a 31 year old white female who has complained of neck pain, headaches, etc. She has failed medical management. She was worked up with a brain MRI which demonstrated a Chiari I malformation. I discussed the various treatment options with her. She has decided to proceed with surgery.   Past Medical History:  Diagnosis Date  . 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 Bipolar disorder (HCC)   . Childhood asthma    seasonal with allergies  . Complication of anesthesia    hard to wake up post-op  . Family history of adverse reaction to anesthesia    pt's father has hx. of being hard to wake up post-op  . History of MRSA infection    axilla  . 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 Panic attacks   . PTSD (post-traumatic stress disorder)   . Shoulder dislocation 06/2015   right    Past Surgical History:  Procedure Laterality Date  . CESAREAN SECTION  09/05/2008  . CYSTOSCOPY N/A 09/15/2012   Procedure: CYSTOSCOPY;  Surgeon: Leslie AndreaJames E Tomblin II, MD;  Location: [facility];  Service: Gynecology;  Laterality: N/A;  . DILATION AND EVACUATION  04/15/2007  . HYSTEROSCOPY W/D&C  03/31/2012   Procedure: DILATATION AND CURETTAGE /HYSTEROSCOPY;  Surgeon: Leslie AndreaJames E Tomblin II, MD;  Location: [facility];  Service: Gynecology;  Laterality: N/A;  . LAPAROSCOPIC ASSISTED VAGINAL HYSTERECTOMY N/A 09/15/2012   Procedure: LAPAROSCOPIC ASSISTED VAGINAL HYSTERECTOMY;  Surgeon: Leslie AndreaJames E Tomblin II, MD;  Location: [facility];  Service: Gynecology;  Laterality: N/A;  . LAPAROSCOPY  03/31/2012   Procedure: LAPAROSCOPY OPERATIVE;  Surgeon: Leslie AndreaJames E Tomblin II, MD;  Location: [facility];  Service: Gynecology;  Laterality: N/A;  with Biopsies of Bilateral Fallopian Tubes; Fulguration of Endometriosis  . SHOULDER ARTHROSCOPY WITH CAPSULORRHAPHY Right 06/28/2015   Procedure: RIGHT SHOULDER ARTHROSCOPY WITH CAPSULORRHAPHY;  Surgeon: Frederico Hammananiel Caffrey, MD;  Location: [facility];  Service: Orthopedics;  Laterality: Right;  . ULNAR NERVE REPAIR Right 05/2015    Allergies  Allergen Reactions  . Dilaudid [Hydromorphone Hcl] Shortness Of Breath  . Adhesive [Tape] Rash  . Morphine And Related Itching and Nausea And Vomiting    Have to give benadryl with morphine, also nausea/vomiting    Social History  Substance Use Topics  . Smoking status: Current Every Day Smoker    Packs/day: 1.00    Years: 10.00    Types: Cigarettes  . Smokeless tobacco: Never Used  . Alcohol use No     Comment: occasionally    Family History  Problem Relation Age of Onset  . Anesthesia problems Father     hard to wake up post-op   Prior to Admission medications   Medication Sig Start Date End Date Taking? Authorizing Provider  docusate sodium (COLACE) 100 MG capsule Take 1 capsule (100 mg total) by mouth every 12 (twelve) hours. 07/03/16  Yes Doug SouSam Jacubowitz, MD  ibuprofen (ADVIL,MOTRIN) 200 MG tablet Take 600 mg by mouth 2 (two) times daily as needed for headache or moderate pain.   Yes Historical Provider, MD  linaclotide (LINZESS) 145 MCG CAPS capsule Take 145 mcg by mouth daily as needed (constipation).   Yes Historical Provider, MD  nicotine (NICODERM CQ - DOSED IN MG/24 HOURS) 14 mg/24hr patch Place 14 mg onto the skin daily as needed (smoking cessation).   Yes Historical Provider, MD  propranolol (INDERAL) 10 MG tablet Take 10 mg by mouth 2 (two) times daily.   Yes  Historical Provider, MD  sertraline (ZOLOFT) 50 MG tablet Take 50 mg by mouth at bedtime.   Yes Historical Provider, MD  traMADol (ULTRAM) 50 MG tablet Take 50 mg by mouth every 12 (twelve) hours as needed for moderate pain.    Yes Historical Provider, MD  azithromycin (ZITHROMAX Z-PAK) 250 MG tablet Take 2 tablets by mouth on day one followed by one tablet daily for 4 days. Patient not taking: Reported on 07/26/2016 03/09/16   Burgess Amor, PA-C  doxycycline (VIBRAMYCIN) 100 MG capsule Take 1 capsule (100 mg total) by  mouth 2 (two) times daily. One po bid x 7 days Patient not taking: Reported on 07/26/2016 07/03/16   Doug Sou, MD  predniSONE (DELTASONE) 10 MG tablet 6, 5, 4, 3, 2 then 1 tablet by mouth daily for 6 days total. Patient not taking: Reported on 07/26/2016 03/09/16   Burgess Amor, PA-C  promethazine-dextromethorphan (PROMETHAZINE-DM) 6.25-15 MG/5ML syrup Take 5 mLs by mouth 4 (four) times daily as needed for cough. Patient not taking: Reported on 07/26/2016 03/09/16   Burgess Amor, PA-C     Review of Systems  Positive ROS: As above  All other systems have been reviewed and were otherwise negative with the exception of those mentioned in the HPI and as above.  Objective: Vital signs in last 24 hours: Temp:  [98.1 F (36.7 C)] 98.1 F (36.7 C) (02/08 0542) Pulse Rate:  [64] 64 (02/08 0542) Resp:  [18] 18 (02/08 0542) BP: (116)/(68) 116/68 (02/08 0542) SpO2:  [100 %] 100 % (02/08 0542) Weight:  [89.4 kg (197 lb)] 89.4 kg (197 lb) (02/08 0542)  General Appearance: Alert Head: Normocephalic, without obvious abnormality, atraumatic Eyes: PERRL, conjunctiva/corneas clear, EOM's intact,    Ears: Normal  Throat: Normal  Neck: Supple, Back: unremarkable Lungs: Clear to auscultation bilaterally, respirations unlabored Heart: Regular rate and rhythm, no murmur, rub or gallop Abdomen: Soft, non-tender Extremities: Extremities normal, atraumatic, no cyanosis or edema Skin: unremarkable  NEUROLOGIC:   Mental status: alert and oriented,Motor Exam - grossly normal Sensory Exam - grossly normal Reflexes: Hyperreflexic Coordination - grossly normal Gait - grossly normal Balance - grossly normal Cranial Nerves: I: smell Not tested  II: visual acuity  OS: Normal  OD: Normal   II: visual fields Full to confrontation  II: pupils Equal, round, reactive to light  III,VII: ptosis None  III,IV,VI: extraocular muscles  Full ROM  V: mastication Normal  V: facial light touch sensation  Normal  V,VII:  corneal reflex  Present  VII: facial muscle function - upper  Normal  VII: facial muscle function - lower Normal  VIII: hearing Not tested  IX: soft palate elevation  Normal  IX,X: gag reflex Present  XI: trapezius strength  5/5  XI: sternocleidomastoid strength 5/5  XI: neck flexion strength  5/5  XII: tongue strength  Normal    Data Review Lab Results  Component Value Date   WBC 6.2 08/01/2016   HGB 13.5 08/01/2016   HCT 38.8 08/01/2016   MCV 92.2 08/01/2016   PLT 201 08/01/2016   Lab Results  Component Value Date   NA 138 08/01/2016   K 3.8 08/01/2016   CL 109 08/01/2016   CO2 22 08/01/2016   BUN 7 08/01/2016   CREATININE 0.67 08/01/2016   GLUCOSE 86 08/01/2016   No results found for: INR, PROTIME  Assessment/Plan: Chiari I malformation, headaches, cervicalgia: I have discussed the situation with the patient and her mother and reviewed her imaging studies with  them. We have discussed the various treatment options including surgery. I described the surgical treatment option of a suboccipital craniectomy, cervical laminectomy and duraplasty. I have shown them surgical models. We have discussed the risks, benefits, alternatives, expected postoperative course, and likelihood of achieving her goals with surgery. I have answered all their questions. The patient has decided to proceed with surgery.   Abrish Erny D 08/08/2016 7:21 AM

## 2016-08-08 NOTE — Progress Notes (Signed)
Patient ID: Jeneen MontgomeryDanielle B Testerman, female   DOB: 10/14/1985, 31 y.o.   MRN: [id number] Subjective:  The patient is somnolent but easily arousable. She is in no apparent distress.  Objective: Vital signs in last 24 hours: Temp:  [97 F (36.1 C)-98.1 F (36.7 C)] 97 F (36.1 C) (02/08 1110) Pulse Rate:  [64-83] 83 (02/08 1130) Resp:  [15-22] 22 (02/08 1130) BP: (97-116)/(53-68) 103/62 (02/08 1130) SpO2:  [98 %-100 %] 98 % (02/08 1130) Arterial Line BP: (101-112)/(57) 101/57 (02/08 1130) Weight:  [89.4 kg (197 lb)] 89.4 kg (197 lb) (02/08 0542)  Intake/Output from previous day: No intake/output data recorded. Intake/Output this shift: Total I/O In: 1300 [I.V.:1300] Out: 800 [Urine:700; Blood:100]  Physical exam the patient is somnolent but easily arousable. She is moving all 4 extremities well.   Lab Results: No results for input(s): WBC, HGB, HCT, PLT in the last 72 hours. BMET No results for input(s): NA, K, CL, CO2, GLUCOSE, BUN, CREATININE, CALCIUM in the last 72 hours.  Studies/Results: No results found.  Assessment/Plan: The patient is doing well. I spoke with her mother and brother.  LOS: 0 days     Jaidin Richison D 08/08/2016, 11:47 AM

## 2016-08-09 MED ORDER — PANTOPRAZOLE SODIUM 40 MG PO TBEC
40.0000 mg | DELAYED_RELEASE_TABLET | Freq: Every day | ORAL | Status: DC
  Administered 2016-08-09: 40 mg via ORAL
  Filled 2016-08-09: qty 1

## 2016-08-09 MED ORDER — LEVETIRACETAM 500 MG PO TABS
500.0000 mg | ORAL_TABLET | Freq: Two times a day (BID) | ORAL | Status: DC
  Administered 2016-08-09 – 2016-08-10 (×3): 500 mg via ORAL
  Filled 2016-08-09 (×3): qty 1

## 2016-08-09 NOTE — Progress Notes (Signed)
Pt up to chair with minimal standby assist.  Foley and a-line removed.

## 2016-08-09 NOTE — Progress Notes (Signed)
Patient ID: Jeneen MontgomeryDanielle B Boggan, female   DOB: 08/08/1985, 31 y.o.   MRN: [id number] Subjective:  The patient is alert and pleasant. She looks well.  Objective: Vital signs in last 24 hours: Temp:  [97 F (36.1 C)-98.6 F (37 C)] 98 F (36.7 C) (02/09 0400) Pulse Rate:  [52-83] 55 (02/09 0600) Resp:  [13-22] 15 (02/09 0600) BP: (94-114)/(45-80) 113/73 (02/09 0600) SpO2:  [96 %-99 %] 97 % (02/09 0600) Arterial Line BP: (101-115)/(53-57) 115/56 (02/08 1200)  Intake/Output from previous day: 02/08 0701 - 02/09 0700 In: 2942.5 [P.O.:240; I.V.:2502.5; IV Piggyback:200] Out: 2790 [Urine:2690; Blood:100] Intake/Output this shift: Total I/O In: -  Out: 850 [Urine:850]  Physical exam the patient is alert and pleasant. Her strength is normal. Her dressing is clean and dry.  Lab Results: No results for input(s): WBC, HGB, HCT, PLT in the last 72 hours. BMET No results for input(s): NA, K, CL, CO2, GLUCOSE, BUN, CREATININE, CALCIUM in the last 72 hours.  Studies/Results: No results found.  Assessment/Plan: Postop day #1: We will mobilize the patient. She will likely go home tomorrow. I gave her discharge instructions and answered all questions. I'll transfer her to the floor.  LOS: 1 day     Vahan Wadsworth D 08/09/2016, 7:59 AM

## 2016-08-09 NOTE — Progress Notes (Signed)
Pt noted ambulating in the hallway. Gait steady, no noted distress.

## 2016-08-09 NOTE — Care Management Note (Signed)
Case Management Note  Patient Details  Name: [name] MRN: [id number] Date of Birth: 05/31/1986  Subjective/Objective:   Suboccipital craniectomy, C1 laminectomy, duraplasty               Action/Plan: Discharge Planning: NCM spoke to pt and mother, [name] at bedside. Pt lives in home with parents and her small child. Pt was independent prior to hospital stay. Can afford medications. Will continue to follow for dc needs.   PCP Dr [name]  Expected Discharge Date:              Expected Discharge Plan:  Home/Self Care  In-House Referral:  NA  Discharge planning Services  CM Consult  Post Acute Care Choice:  NA Choice offered to:  Patient  DME Arranged:  N/A DME Agency:  NA  HH Arranged:  NA HH Agency:  NA  Status of Service:  Completed, signed off  If discussed at Long Length of Stay Meetings, dates discussed:    Additional Comments:  [name], Monica Deal Ellen, RN 08/09/2016, 10:11 AM

## 2016-08-09 NOTE — Evaluation (Signed)
One time Physical Therapy Evaluation Patient Details Name: Jeneen MontgomeryDanielle B Quarry MRN: [id number] DOB: 04/13/1986 Today's Date: 08/09/2016   History of Present Illness  Admit for Suboccipital craniectomy, C1 laminectomy, duraplasty              Clinical Impression  Pt admitted with above diagnosis. Pt currently without significant functional limitations ambulating independently without device. Pt has no deficits and is at baseline.  Needs no equipment or f/u for home.   Has 24 hour care prn.  Will sign off.      Follow Up Recommendations No PT follow up    Equipment Recommendations  None recommended by PT    Recommendations for Other Services       Precautions / Restrictions Precautions Precautions: Fall Restrictions Weight Bearing Restrictions: No      Mobility  Bed Mobility Overal bed mobility: Independent                Transfers Overall transfer level: Independent                  Ambulation/Gait Ambulation/Gait assistance: Supervision Ambulation Distance (Feet): 450 Feet Assistive device: None Gait Pattern/deviations: Step-through pattern;Decreased stride length;Drifts right/left   Gait velocity interpretation: Below normal speed for age/gender General Gait Details: No LOB with challenges to balance.  Pt states she felt unsteady earlier today when first getting up.  Pt needed no assist from PT.    Stairs Stairs: Yes Stairs assistance: Supervision Stair Management: One rail Left;Alternating pattern;Forwards Number of Stairs: 3 General stair comments: No difficulties with up and down steps   Wheelchair Mobility    Modified Rankin (Stroke Patients Only)       Balance Overall balance assessment: Independent                               Standardized Balance Assessment Standardized Balance Assessment : Dynamic Gait Index   Dynamic Gait Index Level Surface: Normal Change in Gait Speed: Normal Gait with Horizontal Head Turns:   (unable to complete due to neck pain) Gait with Vertical Head Turns:  (unable to complete due to pain) Gait and Pivot Turn: Normal Step Over Obstacle: Normal Step Around Obstacles: Normal Steps: Mild Impairment       Pertinent Vitals/Pain Pain Assessment: 0-10 Pain Score: 8  Pain Location: neck and head Pain Descriptors / Indicators: Aching;Grimacing;Guarding;Discomfort;Sore Pain Intervention(s): Limited activity within patient's tolerance;Monitored during session;Premedicated before session;Repositioned  VSS  Home Living Family/patient expects to be discharged to:: Private residence Living Arrangements: Parent;Children Available Help at Discharge: Family;Available 24 hours/day Type of Home: House Home Access: Stairs to enter Entrance Stairs-Rails: Right;Left;Can reach both Entrance Stairs-Number of Steps: 3 Home Layout: One level Home Equipment: None      Prior Function Level of Independence: Independent         Comments: Pt reports that she was off balance to left PTA.       Hand Dominance        Extremity/Trunk Assessment   Upper Extremity Assessment Upper Extremity Assessment: Defer to OT evaluation    Lower Extremity Assessment Lower Extremity Assessment: Overall WFL for tasks assessed    Cervical / Trunk Assessment Cervical / Trunk Assessment: Normal  Communication   Communication: No difficulties  Cognition Arousal/Alertness: Awake/alert Behavior During Therapy: WFL for tasks assessed/performed Overall Cognitive Status: Within Functional Limits for tasks assessed  General Comments General comments (skin integrity, edema, etc.): Did aspects of DGI with pt having no problems.      Exercises     Assessment/Plan    PT Assessment Patent does not need any further PT services  PT Problem List            PT Treatment Interventions      PT Goals (Current goals can be found in the Care Plan section)  Acute Rehab PT  Goals Patient Stated Goal: to go home PT Goal Formulation: All assessment and education complete, DC therapy    Frequency     Barriers to discharge        Co-evaluation               End of Session Equipment Utilized During Treatment: Gait belt Activity Tolerance: Patient tolerated treatment well Patient left: in bed;with call bell/phone within reach;with family/visitor present Nurse Communication: Mobility status         Time: 1610-9604 PT Time Calculation (min) (ACUTE ONLY): 13 min   Charges:   PT Evaluation $PT Eval Low Complexity: 1 Procedure     PT G CodesAmadeo Garnet Jevaeh Shams 08-14-16, 3:54 PM  [facility] Acute Rehabilitation [phone] (pager)

## 2016-08-09 NOTE — Progress Notes (Signed)
Pt received at this time with no noted distress. Pt stable, neuro intact. Surgical gauze dressing clean dry and intact. Pt oriented to room. Safety measures in place.  Mother at bedside. Call bell within reach. Will continue to monitor.

## 2016-08-10 MED ORDER — HYDROCODONE-ACETAMINOPHEN 5-325 MG PO TABS: 1.0000 | ORAL_TABLET | ORAL | 0 refills | Status: DC | PRN

## 2016-08-10 MED ORDER — TIZANIDINE HCL 4 MG PO TABS: 4.0000 mg | ORAL_TABLET | Freq: Three times a day (TID) | ORAL | 1 refills | Status: DC

## 2016-08-10 MED ORDER — LEVETIRACETAM 500 MG PO TABS: 500.0000 mg | ORAL_TABLET | Freq: Two times a day (BID) | ORAL | 0 refills | Status: DC

## 2016-08-10 MED ORDER — SCOPOLAMINE 1 MG/3DAYS TD PT72: 1.0000 | MEDICATED_PATCH | TRANSDERMAL | 12 refills | Status: DC

## 2016-08-10 MED ORDER — METHOCARBAMOL 750 MG PO TABS: 750.0000 mg | ORAL_TABLET | Freq: Three times a day (TID) | ORAL | 1 refills | Status: DC | PRN

## 2016-08-10 NOTE — Discharge Summary (Signed)
Date of Admission: 08/08/2016  Date of Discharge: 08/10/16  Preop diagnosis: Chiari 1 malformation, headaches, cervicalgia  Postop diagnosis: The same  Procedure: Suboccipital craniectomy, C1 laminectomy, duraplasty  Attending: Tressie StalkerJeffrey Jenkins, MD  Hospital Course:  The patient was admitted for the above listed operation and had an uncomplicated post-operative course.  She feels as though she is ready for discharge. They were discharged in stable condition. CN grossly intact. Able to move all extremities. Alert and oriented. Denies Ha, dizziness, changes in vision. Per pt cannot tolerate Robaxin Follow up: 3 weeks  Allergies as of 08/10/2016      Reactions   Dilaudid [hydromorphone Hcl] Shortness Of Breath   Adhesive [tape] Rash   Morphine And Related Itching, Nausea And Vomiting   Have to give benadryl with morphine, also nausea/vomiting      Medication List    STOP taking these medications   azithromycin 250 MG tablet Commonly known as:  ZITHROMAX Z-PAK   doxycycline 100 MG capsule Commonly known as:  VIBRAMYCIN   ibuprofen 200 MG tablet Commonly known as:  ADVIL,MOTRIN   predniSONE 10 MG tablet Commonly known as:  DELTASONE   promethazine-dextromethorphan 6.25-15 MG/5ML syrup Commonly known as:  PROMETHAZINE-DM   traMADol 50 MG tablet Commonly known as:  ULTRAM     TAKE these medications   docusate sodium 100 MG capsule Commonly known as:  COLACE Take 1 capsule (100 mg total) by mouth every 12 (twelve) hours.   HYDROcodone-acetaminophen 5-325 MG tablet Commonly known as:  NORCO/VICODIN Take 1-2 tablets by mouth every 4 (four) hours as needed for moderate pain.   levETIRAcetam 500 MG tablet Commonly known as:  KEPPRA Take 1 tablet (500 mg total) by mouth 2 (two) times daily.   LINZESS 145 MCG Caps capsule Generic drug:  linaclotide Take 145 mcg by mouth daily as needed (constipation).   nicotine 14 mg/24hr patch Commonly known as:  NICODERM CQ - dosed  in mg/24 hours Place 14 mg onto the skin daily as needed (smoking cessation).   propranolol 10 MG tablet Commonly known as:  INDERAL Take 10 mg by mouth 2 (two) times daily.   scopolamine 1 MG/3DAYS Commonly known as:  TRANSDERM-SCOP Place 1 patch (1.5 mg total) onto the skin every 3 (three) days. Start taking on:  08/11/2016   sertraline 50 MG tablet Commonly known as:  ZOLOFT Take 50 mg by mouth at bedtime.   tiZANidine 4 MG tablet Commonly known as:  ZANAFLEX Take 1 tablet (4 mg total) by mouth 3 (three) times daily.

## 2016-08-10 NOTE — Progress Notes (Signed)
Pt ready for discharge. Gave instructions, gave scripts. Pt discharged accompanied by mother.

## 2016-08-14 ENCOUNTER — Encounter (HOSPITAL_COMMUNITY): Payer: Self-pay | Admitting: Neurosurgery

## 2016-10-29 ENCOUNTER — Emergency Department (HOSPITAL_COMMUNITY): Payer: Medicaid Other

## 2016-10-29 ENCOUNTER — Emergency Department (HOSPITAL_COMMUNITY): Payer: Medicaid Other | Admitting: Anesthesiology

## 2016-10-29 ENCOUNTER — Emergency Department (HOSPITAL_COMMUNITY)
Admission: EM | Admit: 2016-10-29 | Discharge: 2016-10-29 | Disposition: A | Discharge status: Home / Self Care | Payer: Medicaid Other | Attending: General Surgery | Admitting: General Surgery

## 2016-10-29 ENCOUNTER — Encounter (HOSPITAL_COMMUNITY): Payer: Self-pay | Admitting: *Deleted

## 2016-10-29 ENCOUNTER — Encounter (HOSPITAL_COMMUNITY)
Admission: EM | Disposition: A | Discharge status: Home / Self Care | Payer: Self-pay | Source: Home / Self Care | Attending: Emergency Medicine

## 2016-10-29 DIAGNOSIS — K3580 Unspecified acute appendicitis: Secondary | ICD-10-CM | POA: Diagnosis present

## 2016-10-29 DIAGNOSIS — Z8614 Personal history of Methicillin resistant Staphylococcus aureus infection: Secondary | ICD-10-CM | POA: Diagnosis not present

## 2016-10-29 DIAGNOSIS — F319 Bipolar disorder, unspecified: Secondary | ICD-10-CM | POA: Insufficient documentation

## 2016-10-29 DIAGNOSIS — N200 Calculus of kidney: Secondary | ICD-10-CM | POA: Diagnosis not present

## 2016-10-29 DIAGNOSIS — F4310 Post-traumatic stress disorder, unspecified: Secondary | ICD-10-CM | POA: Diagnosis not present

## 2016-10-29 DIAGNOSIS — F17210 Nicotine dependence, cigarettes, uncomplicated: Secondary | ICD-10-CM | POA: Diagnosis not present

## 2016-10-29 DIAGNOSIS — Z885 Allergy status to narcotic agent status: Secondary | ICD-10-CM | POA: Insufficient documentation

## 2016-10-29 DIAGNOSIS — K353 Acute appendicitis with localized peritonitis: Secondary | ICD-10-CM | POA: Diagnosis not present

## 2016-10-29 HISTORY — PX: LAPAROSCOPIC APPENDECTOMY: SHX408

## 2016-10-29 LAB — CBC WITH DIFFERENTIAL/PLATELET
Basophils Absolute: 0 10*3/uL (ref 0.0–0.1)
Basophils Relative: 0 %
Eosinophils Absolute: 0.1 10*3/uL (ref 0.0–0.7)
Eosinophils Relative: 1 %
HCT: 43.4 % (ref 36.0–46.0)
Hemoglobin: 14.7 g/dL (ref 12.0–15.0)
Lymphocytes Relative: 9 %
Lymphs Abs: 1.3 10*3/uL (ref 0.7–4.0)
MCH: 32.5 pg (ref 26.0–34.0)
MCHC: 33.9 g/dL (ref 30.0–36.0)
MCV: 96 fL (ref 78.0–100.0)
Monocytes Absolute: 0.7 10*3/uL (ref 0.1–1.0)
Monocytes Relative: 5 %
Neutro Abs: 12.1 10*3/uL — ABNORMAL HIGH (ref 1.7–7.7)
Neutrophils Relative %: 85 %
Platelets: 226 10*3/uL (ref 150–400)
RBC: 4.52 MIL/uL (ref 3.87–5.11)
RDW: 12.8 % (ref 11.5–15.5)
WBC: 14.2 10*3/uL — ABNORMAL HIGH (ref 4.0–10.5)

## 2016-10-29 LAB — COMPREHENSIVE METABOLIC PANEL
ALT: 17 U/L (ref 14–54)
AST: 19 U/L (ref 15–41)
Albumin: 4 g/dL (ref 3.5–5.0)
Alkaline Phosphatase: 52 U/L (ref 38–126)
Anion gap: 8 (ref 5–15)
BUN: 11 mg/dL (ref 6–20)
CO2: 23 mmol/L (ref 22–32)
Calcium: 8.8 mg/dL — ABNORMAL LOW (ref 8.9–10.3)
Chloride: 105 mmol/L (ref 101–111)
Creatinine, Ser: 0.69 mg/dL (ref 0.44–1.00)
GFR calc Af Amer: >60 mL/min (ref >60)
GFR calc non Af Amer: >60 mL/min (ref >60)
Glucose, Bld: 104 mg/dL — ABNORMAL HIGH (ref 65–99)
Potassium: 3.7 mmol/L (ref 3.5–5.1)
Sodium: 136 mmol/L (ref 135–145)
Total Bilirubin: 0.4 mg/dL (ref 0.3–1.2)
Total Protein: 6.7 g/dL (ref 6.5–8.1)

## 2016-10-29 LAB — URINALYSIS, ROUTINE W REFLEX MICROSCOPIC
Bilirubin Urine: NEGATIVE
Glucose, UA: NEGATIVE mg/dL
Ketones, ur: NEGATIVE mg/dL
Leukocytes, UA: NEGATIVE
Nitrite: NEGATIVE
Protein, ur: NEGATIVE mg/dL
Specific Gravity, Urine: 1.023 (ref 1.005–1.030)
pH: 5 (ref 5.0–8.0)

## 2016-10-29 LAB — PREGNANCY, URINE: Preg Test, Ur: NEGATIVE

## 2016-10-29 LAB — LIPASE, BLOOD: Lipase: 23 U/L (ref 11–51)

## 2016-10-29 SURGERY — APPENDECTOMY, LAPAROSCOPIC
Anesthesia: General | Site: Abdomen

## 2016-10-29 MED ORDER — ONDANSETRON HCL 4 MG/2ML IJ SOLN
4.0000 mg | Freq: Once | INTRAMUSCULAR | Status: AC
  Administered 2016-10-29: 4 mg via INTRAVENOUS
  Filled 2016-10-29: qty 2

## 2016-10-29 MED ORDER — CHLORHEXIDINE GLUCONATE CLOTH 2 % EX PADS: 6.0000 | MEDICATED_PAD | Freq: Once | CUTANEOUS | Status: DC

## 2016-10-29 MED ORDER — LACTATED RINGERS IV SOLN
INTRAVENOUS | Status: DC
  Administered 2016-10-29: 10:00:00 via INTRAVENOUS

## 2016-10-29 MED ORDER — MIDAZOLAM HCL 2 MG/2ML IJ SOLN
1.0000 mg | INTRAMUSCULAR | Status: AC
  Administered 2016-10-29: 2 mg via INTRAVENOUS

## 2016-10-29 MED ORDER — FENTANYL CITRATE (PF) 250 MCG/5ML IJ SOLN
INTRAMUSCULAR | Status: AC
  Filled 2016-10-29: qty 5

## 2016-10-29 MED ORDER — SODIUM CHLORIDE 0.9 % IV SOLN
INTRAVENOUS | Status: DC | PRN
  Administered 2016-10-29: 09:00:00 via INTRAVENOUS

## 2016-10-29 MED ORDER — LIDOCAINE HCL (CARDIAC) 20 MG/ML IV SOLN
INTRAVENOUS | Status: DC | PRN
  Administered 2016-10-29: 30 mg via INTRAVENOUS

## 2016-10-29 MED ORDER — ROCURONIUM BROMIDE 50 MG/5ML IV SOLN
INTRAVENOUS | Status: AC
  Filled 2016-10-29: qty 1

## 2016-10-29 MED ORDER — ONDANSETRON HCL 4 MG/2ML IJ SOLN
INTRAMUSCULAR | Status: AC
  Filled 2016-10-29: qty 2

## 2016-10-29 MED ORDER — POVIDONE-IODINE 10 % OINT PACKET
TOPICAL_OINTMENT | CUTANEOUS | Status: DC | PRN
  Administered 2016-10-29: 1 via TOPICAL

## 2016-10-29 MED ORDER — DEXAMETHASONE SODIUM PHOSPHATE 4 MG/ML IJ SOLN
INTRAMUSCULAR | Status: AC
  Filled 2016-10-29: qty 1

## 2016-10-29 MED ORDER — GLYCOPYRROLATE 0.2 MG/ML IJ SOLN
INTRAMUSCULAR | Status: AC
  Filled 2016-10-29: qty 1

## 2016-10-29 MED ORDER — KETOROLAC TROMETHAMINE 30 MG/ML IJ SOLN
30.0000 mg | Freq: Once | INTRAMUSCULAR | Status: AC
  Administered 2016-10-29: 30 mg via INTRAVENOUS

## 2016-10-29 MED ORDER — METRONIDAZOLE IN NACL 5-0.79 MG/ML-% IV SOLN
500.0000 mg | Freq: Once | INTRAVENOUS | Status: AC
  Administered 2016-10-29: 500 mg via INTRAVENOUS
  Filled 2016-10-29: qty 100

## 2016-10-29 MED ORDER — GLYCOPYRROLATE 0.2 MG/ML IJ SOLN
INTRAMUSCULAR | Status: DC | PRN
  Administered 2016-10-29: 0.2 mg via INTRAVENOUS

## 2016-10-29 MED ORDER — LIDOCAINE HCL (PF) 1 % IJ SOLN
INTRAMUSCULAR | Status: AC
  Filled 2016-10-29: qty 5

## 2016-10-29 MED ORDER — DEXAMETHASONE SODIUM PHOSPHATE 4 MG/ML IJ SOLN
4.0000 mg | Freq: Once | INTRAMUSCULAR | Status: AC
  Administered 2016-10-29: 4 mg via INTRAVENOUS

## 2016-10-29 MED ORDER — BUPIVACAINE HCL (PF) 0.5 % IJ SOLN
INTRAMUSCULAR | Status: AC
  Filled 2016-10-29: qty 30

## 2016-10-29 MED ORDER — DEXTROSE 5 % IV SOLN
INTRAVENOUS | Status: AC
  Filled 2016-10-29: qty 2

## 2016-10-29 MED ORDER — FENTANYL CITRATE (PF) 100 MCG/2ML IJ SOLN: 25.0000 ug | INTRAMUSCULAR | Status: DC | PRN

## 2016-10-29 MED ORDER — SODIUM CHLORIDE 0.9 % IV BOLUS (SEPSIS)
1000.0000 mL | Freq: Once | INTRAVENOUS | Status: AC
  Administered 2016-10-29: 1000 mL via INTRAVENOUS

## 2016-10-29 MED ORDER — NICOTINE 21 MG/24HR TD PT24
21.0000 mg | MEDICATED_PATCH | Freq: Once | TRANSDERMAL | Status: DC
Start: 2016-10-29 — End: 2016-10-29
  Administered 2016-10-29: 21 mg via TRANSDERMAL
  Filled 2016-10-29: qty 1

## 2016-10-29 MED ORDER — SUCCINYLCHOLINE CHLORIDE 20 MG/ML IJ SOLN
INTRAMUSCULAR | Status: AC
  Filled 2016-10-29: qty 1

## 2016-10-29 MED ORDER — DEXTROSE 5 % IV SOLN
2.0000 g | Freq: Once | INTRAVENOUS | Status: AC
  Administered 2016-10-29: 2 g via INTRAVENOUS

## 2016-10-29 MED ORDER — ONDANSETRON HCL 4 MG/2ML IJ SOLN
4.0000 mg | Freq: Once | INTRAMUSCULAR | Status: AC
  Administered 2016-10-29: 4 mg via INTRAVENOUS

## 2016-10-29 MED ORDER — MIDAZOLAM HCL 2 MG/2ML IJ SOLN
INTRAMUSCULAR | Status: AC
  Filled 2016-10-29: qty 2

## 2016-10-29 MED ORDER — ATROPINE SULFATE 0.4 MG/ML IJ SOLN
INTRAMUSCULAR | Status: DC | PRN
  Administered 2016-10-29 (×2): 0.4 mg via INTRAVENOUS

## 2016-10-29 MED ORDER — IOPAMIDOL (ISOVUE-300) INJECTION 61%
INTRAVENOUS | Status: AC
  Administered 2016-10-29: 30 mL
  Filled 2016-10-29: qty 30

## 2016-10-29 MED ORDER — FENTANYL CITRATE (PF) 100 MCG/2ML IJ SOLN
50.0000 ug | Freq: Once | INTRAMUSCULAR | Status: AC
  Administered 2016-10-29: 50 ug via INTRAVENOUS
  Filled 2016-10-29: qty 2

## 2016-10-29 MED ORDER — ATROPINE SULFATE 0.4 MG/ML IJ SOLN
INTRAMUSCULAR | Status: AC
  Filled 2016-10-29: qty 2

## 2016-10-29 MED ORDER — BUPIVACAINE HCL (PF) 0.5 % IJ SOLN
INTRAMUSCULAR | Status: DC | PRN
  Administered 2016-10-29: 10 mL

## 2016-10-29 MED ORDER — IOPAMIDOL (ISOVUE-300) INJECTION 61%
100.0000 mL | Freq: Once | INTRAVENOUS | Status: AC | PRN
  Administered 2016-10-29: 100 mL via INTRAVENOUS

## 2016-10-29 MED ORDER — KETOROLAC TROMETHAMINE 30 MG/ML IJ SOLN
INTRAMUSCULAR | Status: AC
  Filled 2016-10-29: qty 1

## 2016-10-29 MED ORDER — PROPOFOL 10 MG/ML IV BOLUS
INTRAVENOUS | Status: DC | PRN
  Administered 2016-10-29: 50 mg via INTRAVENOUS
  Administered 2016-10-29: 150 mg via INTRAVENOUS
  Administered 2016-10-29: 50 mg via INTRAVENOUS

## 2016-10-29 MED ORDER — 0.9 % SODIUM CHLORIDE (POUR BTL) OPTIME
TOPICAL | Status: DC | PRN
  Administered 2016-10-29: 1000 mL

## 2016-10-29 MED ORDER — POVIDONE-IODINE 10 % EX OINT
TOPICAL_OINTMENT | CUTANEOUS | Status: AC
  Filled 2016-10-29: qty 1

## 2016-10-29 MED ORDER — FENTANYL CITRATE (PF) 100 MCG/2ML IJ SOLN
INTRAMUSCULAR | Status: DC | PRN
  Administered 2016-10-29 (×5): 50 ug via INTRAVENOUS

## 2016-10-29 MED ORDER — SUCCINYLCHOLINE CHLORIDE 20 MG/ML IJ SOLN
INTRAMUSCULAR | Status: DC | PRN
  Administered 2016-10-29: 120 mg via INTRAVENOUS

## 2016-10-29 MED ORDER — OXYCODONE-ACETAMINOPHEN 7.5-325 MG PO TABS: 1.0000 | ORAL_TABLET | ORAL | 0 refills | Status: DC | PRN

## 2016-10-29 SURGICAL SUPPLY — 46 items
BAG HAMPER (MISCELLANEOUS) ×3 IMPLANT
BAG RETRIEVAL 10 (BASKET) ×1
BAG RETRIEVAL 10MM (BASKET) ×1
CHLORAPREP W/TINT 26ML (MISCELLANEOUS) ×3 IMPLANT
CLOTH BEACON ORANGE TIMEOUT ST (SAFETY) ×3 IMPLANT
COVER LIGHT HANDLE STERIS (MISCELLANEOUS) ×6 IMPLANT
CUTTER FLEX LINEAR 45M (STAPLE) ×3 IMPLANT
DECANTER SPIKE VIAL GLASS SM (MISCELLANEOUS) ×3 IMPLANT
ELECT REM PT RETURN 9FT ADLT (ELECTROSURGICAL) ×3
ELECTRODE REM PT RTRN 9FT ADLT (ELECTROSURGICAL) ×1 IMPLANT
EVACUATOR SMOKE 8.L (FILTER) ×3 IMPLANT
FORMALIN 10 PREFIL 120ML (MISCELLANEOUS) ×3 IMPLANT
GLOVE BIOGEL PI IND STRL 7.0 (GLOVE) ×2 IMPLANT
GLOVE BIOGEL PI INDICATOR 7.0 (GLOVE) ×2
GLOVE SURG SS PI 7.5 STRL IVOR (GLOVE) ×3 IMPLANT
GOWN STRL REUS W/ TWL XL LVL3 (GOWN DISPOSABLE) ×1 IMPLANT
GOWN STRL REUS W/TWL LRG LVL3 (GOWN DISPOSABLE) ×3 IMPLANT
GOWN STRL REUS W/TWL XL LVL3 (GOWN DISPOSABLE) ×3
INST SET LAPROSCOPIC AP (KITS) ×3 IMPLANT
IV NS IRRIG 3000ML ARTHROMATIC (IV SOLUTION) IMPLANT
KIT ROOM TURNOVER APOR (KITS) ×3 IMPLANT
MANIFOLD NEPTUNE II (INSTRUMENTS) ×3 IMPLANT
NDL INSUFFLATION 14GA 120MM (NEEDLE) ×1 IMPLANT
NEEDLE INSUFFLATION 14GA 120MM (NEEDLE) ×3 IMPLANT
NS IRRIG 1000ML POUR BTL (IV SOLUTION) ×3 IMPLANT
PACK LAP CHOLE LZT030E (CUSTOM PROCEDURE TRAY) ×3 IMPLANT
PAD ARMBOARD 7.5X6 YLW CONV (MISCELLANEOUS) ×3 IMPLANT
PENCIL HANDSWITCHING (ELECTRODE) ×3 IMPLANT
RELOAD 45 VASCULAR/THIN (ENDOMECHANICALS) ×3 IMPLANT
SET BASIN LINEN APH (SET/KITS/TRAYS/PACK) ×3 IMPLANT
SET TUBE IRRIG SUCTION NO TIP (IRRIGATION / IRRIGATOR) IMPLANT
SHEARS HARMONIC ACE PLUS 36CM (ENDOMECHANICALS) ×3 IMPLANT
SPONGE GAUZE 2X2 8PLY STER LF (GAUZE/BANDAGES/DRESSINGS) ×1
SPONGE GAUZE 2X2 8PLY STRL LF (GAUZE/BANDAGES/DRESSINGS) ×4 IMPLANT
STAPLER VISISTAT (STAPLE) ×3 IMPLANT
SUT VICRYL 0 UR6 27IN ABS (SUTURE) ×3 IMPLANT
SYS BAG RETRIEVAL 10MM (BASKET) ×1
SYSTEM BAG RETRIEVAL 10MM (BASKET) ×1 IMPLANT
TAPE CLOTH SURG 4X10 WHT LF (GAUZE/BANDAGES/DRESSINGS) ×3 IMPLANT
TRAY FOLEY CATH SILVER 16FR (SET/KITS/TRAYS/PACK) ×3 IMPLANT
TROCAR ENDO BLADELESS 11MM (ENDOMECHANICALS) ×3 IMPLANT
TROCAR ENDO BLADELESS 12MM (ENDOMECHANICALS) ×3 IMPLANT
TROCAR XCEL NON-BLD 5MMX100MML (ENDOMECHANICALS) ×3 IMPLANT
TUBING INSUFFLATION (TUBING) ×3 IMPLANT
WARMER LAPAROSCOPE (MISCELLANEOUS) ×3 IMPLANT
YANKAUER SUCT 12FT TUBE ARGYLE (SUCTIONS) ×3 IMPLANT

## 2016-10-29 NOTE — ED Notes (Signed)
Informed pt of need for a urine sample. Pt states she does not have to go at this time but was informed to let nursing staff know when able to provide one.

## 2016-10-29 NOTE — Anesthesia Preprocedure Evaluation (Signed)
Anesthesia Evaluation  Patient identified by MRN, date of birth, ID band Patient awake    Reviewed: Allergy & Precautions, H&P , NPO status , Patient's Chart, lab work & pertinent test results  History of Anesthesia Complications (+) Family history of anesthesia reaction and history of anesthetic complications (slow to wake up)  Airway Mallampati: II  TM Distance: >3 FB Neck ROM: Full    Dental no notable dental hx. (+) Teeth Intact, Dental Advisory Given   Pulmonary asthma , Current Smoker,    Pulmonary exam normal breath sounds clear to auscultation       Cardiovascular Exercise Tolerance: Good negative cardio ROS   Rhythm:Regular Rate:Normal     Neuro/Psych  Headaches, PSYCHIATRIC DISORDERS Anxiety Bipolar Disorder Chiari malformation negative psych ROS   GI/Hepatic negative GI ROS, Neg liver ROS,   Endo/Other  negative endocrine ROS  Renal/GU negative Renal ROS  negative genitourinary   Musculoskeletal   Abdominal   Peds  Hematology negative hematology ROS (+)   Anesthesia Other Findings   Reproductive/Obstetrics negative OB ROS                             Anesthesia Physical Anesthesia Plan  ASA: III and emergent  Anesthesia Plan: General   Post-op Pain Management:    Induction: Intravenous, Rapid sequence and Cricoid pressure planned  Airway Management Planned: Oral ETT  Additional Equipment:   Intra-op Plan:   Post-operative Plan: Extubation in OR  Informed Consent: I have reviewed the patients History and Physical, chart, labs and discussed the procedure including the risks, benefits and alternatives for the proposed anesthesia with the patient or authorized representative who has indicated his/her understanding and acceptance.     Plan Discussed with:   Anesthesia Plan Comments: (N/V today, RLQ pain.)        Anesthesia Quick Evaluation

## 2016-10-29 NOTE — Op Note (Signed)
Patient:  [name]  DOB:  1986-03-25  MRN:  [id number]   Preop Diagnosis:  Acute appendicitis  Postop Diagnosis:  Same  Procedure:  Laparoscopic appendectomy  Surgeon:  Franky Macho, [name]  Anes:  Gen. endotracheal  Indications:  Patient is a 31 year old white female who presents with a less than 24-hour history of worsening right lower quadrant abdominal pain. CT scan of the abdomen reveals acute appendicitis. The patient now comes to the operating room for laparoscopic appendectomy. The risks and benefits of the procedure including bleeding, infection, and the possibility of an open procedure were fully explained to the patient, who gave informed consent.  Procedure note:  The patient was placed in the supine position. After induction of general endotracheal anesthesia, the abdomen was prepped and draped using the usual sterile technique with DuraPrep. Surgical site confirmation was formed.  A supraumbilical incision was made down to the fascia. A Veress needle was introduced into the abdominal cavity and confirmation of placement was done using the saline drop test. The abdomen was then insufflated to 16 mmHg pressure. An 11 mm trocar was introduced into the abdominal cavity under direct visualization without difficulty. The patient was placed in deeper Trendelenburg position and an additional 12 mm trocar was placed in the suprapubic region and a 5 mm trocar was placed left lower quadrant region. The appendix was visualized and the distal half of the appendix was inflamed. There was no evidence of perforation. The mesoappendix was divided using the Harmonic scalpel. A vascular Endo GIA was placed across the base the appendix and fired. The appendix was then removed using an Endo Catch bag without difficulty. The staple line was inspected and noted to be within normal limits. All fluid and air were then evacuated from the abdominal cavity prior to the removal of the trochars.  All  wounds were irrigated with normal saline. All wounds were injected with 0.5% Sensorcaine. The supraumbilical fascia was reapproximated using an 0 Vicryl interrupted suture. All skin incisions were closed using staples. Betadine ointment and dry sterile dressings were applied.  All tape and needle counts were correct at the end the procedure. The patient was extubated in the operating room and transferred to PACU in stable condition.  Complications:  None  EBL:  Minimal  Specimen:  Appendix

## 2016-10-29 NOTE — ED Triage Notes (Signed)
Pt c/o right upper abdominal pain that started last night around 2330; pt states she has vomited x 3

## 2016-10-29 NOTE — H&P (Signed)
[name] is an 31 y.o. female.   Chief Complaint: Right lower quadrant abdominal pain HPI: Patient is a 31 year old white female who presents with a less than 24-hour history of worsening right lower quadrant abdominal pain. CT scan of the abdomen reveals acute appendicitis.  Past Medical History:  Diagnosis Date  . 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 Bipolar disorder ([place])   . Childhood asthma    seasonal with allergies  . Complication of anesthesia    hard to wake up post-op  . Family history of adverse reaction to anesthesia    pt's father has hx. of being hard to wake up post-op  . History of MRSA infection    axilla  . 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 Panic attacks   . PTSD (post-traumatic stress disorder)   . Shoulder dislocation 06/2015   right    Past Surgical History:  Procedure Laterality Date  . CESAREAN SECTION  09/05/2008  . CYSTOSCOPY N/A 09/15/2012   Procedure: CYSTOSCOPY;  Surgeon: Allena Katz, MD;  Location: [facility];  Service: Gynecology;  Laterality: N/A;  . DILATION AND EVACUATION  04/15/2007  . HYSTEROSCOPY W/D&C  03/31/2012   Procedure: DILATATION AND CURETTAGE /HYSTEROSCOPY;  Surgeon: Allena Katz, MD;  Location: [facility];  Service: Gynecology;  Laterality: N/A;  . LAPAROSCOPIC ASSISTED VAGINAL HYSTERECTOMY N/A 09/15/2012   Procedure: LAPAROSCOPIC ASSISTED VAGINAL HYSTERECTOMY;  Surgeon: Allena Katz, MD;  Location: [facility];  Service: Gynecology;  Laterality: N/A;  . LAPAROSCOPY  03/31/2012   Procedure: LAPAROSCOPY OPERATIVE;  Surgeon: Allena Katz, MD;  Location: [facility];  Service: Gynecology;  Laterality: N/A;  with Biopsies of Bilateral Fallopian Tubes; Fulguration of Endometriosis  . SHOULDER ARTHROSCOPY WITH CAPSULORRHAPHY Right 06/28/2015   Procedure: RIGHT SHOULDER ARTHROSCOPY WITH CAPSULORRHAPHY;  Surgeon: Earlie Server, MD;  Location: [facility];   Service: Orthopedics;  Laterality: Right;  . SUBOCCIPITAL CRANIECTOMY CERVICAL LAMINECTOMY N/A 08/08/2016   Procedure: SUBOCCIPITAL CRANIECTOMY CERVICAL LAMINECTOMY NUMBER DURAPLASTY;  Surgeon: Newman Pies, MD;  Location: [place];  Service: Neurosurgery;  Laterality: N/A;  suboccipital  . ULNAR NERVE REPAIR Right 05/2015    Family History  Problem Relation Age of Onset  . Anesthesia problems Father     hard to wake up post-op   Social History:  reports that she has been smoking Cigarettes.  She has a 10.00 pack-year smoking history. She has never used smokeless tobacco. She reports that she does not drink alcohol or use drugs.  Allergies:  Allergies  Allergen Reactions  . Dilaudid [Hydromorphone Hcl] Shortness Of Breath  . Adhesive [Tape] Rash  . Morphine And Related Itching and Nausea And Vomiting    Have to give benadryl with morphine, also nausea/vomiting     (Not in a hospital admission)  Results for orders placed or performed during the hospital encounter of 10/29/16 (from the past 48 hour(s))  Comprehensive metabolic panel     Status: Abnormal   Collection Time: 10/29/16  4:49 AM  Result Value Ref Range   Sodium 136 135 - 145 mmol/L   Potassium 3.7 3.5 - 5.1 mmol/L   Chloride 105 101 - 111 mmol/L   CO2 23 22 - 32 mmol/L   Glucose, Bld 104 (H) 65 - 99 mg/dL   BUN 11 6 - 20 mg/dL   Creatinine, Ser 0.69 0.44 - 1.00 mg/dL   Calcium 8.8 (L) 8.9 - 10.3 mg/dL  Total Protein 6.7 6.5 - 8.1 g/dL   Albumin 4.0 3.5 - 5.0 g/dL   AST 19 15 - 41 U/L   ALT 17 14 - 54 U/L   Alkaline Phosphatase 52 38 - 126 U/L   Total Bilirubin 0.4 0.3 - 1.2 mg/dL   GFR calc non Af Amer >60 >60 mL/min   GFR calc Af Amer >60 >60 mL/min    Comment: (NOTE) The eGFR has been calculated using the CKD EPI equation. This calculation has not been validated in all clinical situations. eGFR's persistently <60 mL/min signify possible Chronic Kidney Disease.    Anion gap 8 5 - 15  Lipase, blood      Status: None   Collection Time: 10/29/16  4:49 AM  Result Value Ref Range   Lipase 23 11 - 51 U/L  CBC with Differential     Status: Abnormal   Collection Time: 10/29/16  4:49 AM  Result Value Ref Range   WBC 14.2 (H) 4.0 - 10.5 K/uL   RBC 4.52 3.87 - 5.11 MIL/uL   Hemoglobin 14.7 12.0 - 15.0 g/dL   HCT 43.4 36.0 - 46.0 %   MCV 96.0 78.0 - 100.0 fL   MCH 32.5 26.0 - 34.0 pg   MCHC 33.9 30.0 - 36.0 g/dL   RDW 12.8 11.5 - 15.5 %   Platelets 226 150 - 400 K/uL   Neutrophils Relative % 85 %   Neutro Abs 12.1 (H) 1.7 - 7.7 K/uL   Lymphocytes Relative 9 %   Lymphs Abs 1.3 0.7 - 4.0 K/uL   Monocytes Relative 5 %   Monocytes Absolute 0.7 0.1 - 1.0 K/uL   Eosinophils Relative 1 %   Eosinophils Absolute 0.1 0.0 - 0.7 K/uL   Basophils Relative 0 %   Basophils Absolute 0.0 0.0 - 0.1 K/uL  Urinalysis, Routine w reflex microscopic     Status: Abnormal   Collection Time: 10/29/16  4:49 AM  Result Value Ref Range   Color, Urine YELLOW YELLOW   APPearance HAZY (A) CLEAR   Specific Gravity, Urine 1.023 1.005 - 1.030   pH 5.0 5.0 - 8.0   Glucose, UA NEGATIVE NEGATIVE mg/dL   Hgb urine dipstick SMALL (A) NEGATIVE   Bilirubin Urine NEGATIVE NEGATIVE   Ketones, ur NEGATIVE NEGATIVE mg/dL   Protein, ur NEGATIVE NEGATIVE mg/dL   Nitrite NEGATIVE NEGATIVE   Leukocytes, UA NEGATIVE NEGATIVE   RBC / HPF 0-5 0 - 5 RBC/hpf   WBC, UA 0-5 0 - 5 WBC/hpf   Bacteria, UA RARE (A) NONE SEEN   Squamous Epithelial / LPF 0-5 (A) NONE SEEN   Mucous PRESENT    Hyaline Casts, UA PRESENT   Pregnancy, urine     Status: None   Collection Time: 10/29/16  4:49 AM  Result Value Ref Range   Preg Test, Ur NEGATIVE NEGATIVE   Ct Abdomen Pelvis W Contrast  Result Date: 10/29/2016 CLINICAL DATA:  Initial evaluation for acute mid and right lower quadrant abdominal pain. EXAM: CT ABDOMEN AND PELVIS WITH CONTRAST TECHNIQUE: Multidetector CT imaging of the abdomen and pelvis was performed using the standard protocol  following bolus administration of intravenous contrast. CONTRAST:  11m ISOVUE-300 IOPAMIDOL (ISOVUE-300) INJECTION 61%, 1074mISOVUE-300 IOPAMIDOL (ISOVUE-300) INJECTION 61% COMPARISON:  None. FINDINGS: Lower chest: Bibasilar atelectasis present dependently within the visualized lung bases. Visualized lungs are otherwise clear. Hepatobiliary: Scattered hypodense lesions present within the liver, indeterminate, but may reflect cysts or possibly hemangiomas. These are of doubtful  significance. Liver otherwise unremarkable. Gallbladder within normal limits. No biliary dilatation. Pancreas: Pancreas within normal limits. Spleen: 2 small hypodensities measuring up to 13 mm present within the spleen, indeterminate, but may reflect small cyst. Spleen otherwise unremarkable. Adrenals/Urinary Tract: Adrenal glands normal. Kidneys equal in size with symmetric enhancement. 5 mm nonobstructive stone present within the left kidney. Few scattered subcentimeter hypodensities noted, indeterminate, but statistically likely reflects small cyst. No focal enhancing renal mass. No hydronephrosis. No hydroureter. Partially distended bladder within normal limits. Stomach/Bowel: Stomach within normal limits. No evidence for bowel obstruction. Appendix is dilated up to 10 mm. Associated mucosal enhancement with periappendiceal inflammatory stranding. Findings consistent with acute appendicitis. No evidence for perforation or other complication. No other acute inflammatory changes seen about the bowels. Vascular/Lymphatic: Normal intravascular enhancement seen throughout the intra-abdominal aorta and its branch vessels. No pathologically enlarged intra-abdominal or pelvic lymph nodes. Reproductive: Uterus is absent.  Ovaries within normal limits. Other: Small volume free fluid within the pelvis, which may be physiologic or reactive. No free intraperitoneal air. Musculoskeletal: No acute osseous abnormality. No worrisome lytic or blastic  osseous lesions. IMPRESSION: 1. Findings consistent with acute appendicitis. No complication identified. 2. Small volume free fluid within the pelvis, which may be physiologic and/or reactive. 3. 5 mm nonobstructive left renal nephrolithiasis. 4. Scattered hypodensities within the liver and spleen, indeterminate, but favored to reflect small cysts. Electronically Signed   By: Jeannine Boga [name]   On: 10/29/2016 07:06    Review of Systems  Constitutional: Positive for malaise/fatigue.  HENT: Negative.   Eyes: Negative.   Respiratory: Negative.   Cardiovascular: Negative.   Gastrointestinal: Positive for abdominal pain and nausea. Negative for diarrhea.  Genitourinary: Negative.   Musculoskeletal: Negative.   Skin: Negative.   Endo/Heme/Allergies: Negative.   Psychiatric/Behavioral: Negative.     Blood pressure 121/73, pulse 66, temperature 97.9 F (36.6 C), temperature source Oral, resp. rate (!) 21, height _0  (1.676 m), weight 193 lb (87.5 kg), last menstrual period 08/18/2012, SpO2 97 %. Physical Exam  Vitals reviewed. Constitutional: She is oriented to person, place, and time. She appears well-developed and well-nourished.  HENT:  Head: Normocephalic and atraumatic.  Neck: Normal range of motion. Neck supple.  Cardiovascular: Normal rate, regular rhythm and normal heart sounds.   No murmur heard. Respiratory: Effort normal and breath sounds normal. She has no wheezes. She has no rales.  GI: Soft. She exhibits no distension. There is tenderness.  Tender in the right lower quadrant to palpation. No rigidity noted.  Neurological: She is alert and oriented to person, place, and time.  Skin: Skin is warm and dry.    CT scan report reviewed. Assessment/Plan Impression: Acute appendicitis Plan: Patient be taken to the operating room for laparoscopic appendectomy. The risks and benefits of the procedure including bleeding, infection, and the possibility of an open procedure  were fully explained to the patient, who gave informed consent.  Aviva Signs, MD 10/29/2016, 8:25 AM

## 2016-10-29 NOTE — Discharge Instructions (Signed)
Laparoscopic Appendectomy, Adult, Care After °Refer to this sheet in the next few weeks. These instructions provide you with information about caring for yourself after your procedure. Your health care provider may also give you more specific instructions. Your treatment has been planned according to current medical practices, but problems sometimes occur. Call your health care provider if you have any problems or questions after your procedure. °What can I expect after the procedure? °After the procedure, it is common to have: °· A decrease in your energy level. °· Mild pain in the area where the surgical cuts (incisions) were made. °· Constipation. This can be caused by pain medicine and a decrease in your activity. °Follow these instructions at home: °Medicines  °· Take over-the-counter and prescription medicines only as told by your health care provider. °· Do not drive for 24 hours if you received a sedative. °· Do not drive or operate heavy machinery while taking prescription pain medicine. °· If you were prescribed an antibiotic medicine, take it as told by your health care provider. Do not stop taking the antibiotic even if you start to feel better. °Activity  °· For 3 weeks or as long as told by your health care provider: °¨ Do not lift anything that is heavier than 10 pounds (4.5 kg). °¨ Do not play contact sports. °· Gradually return to your normal activities. Ask your health care provider what activities are safe for you. °Bathing  °· Keep your incisions clean and dry. Clean them as often as told by your health care provider: °¨ Gently wash the incisions with soap and water. °¨ Rinse the incisions with water to remove all soap. °¨ Pat the incisions dry with a clean towel. Do not rub the incisions. °· You may take showers after 48 hours. °· Do not take baths, swim, or use hot tubs for 2 weeks or as told by your health care provider. °Incision care  °· Follow instructions from your healthcare provider  about how to take care of your incisions. Make sure you: °¨ Wash your hands with soap and water before you change your bandage (dressing). If soap and water are not available, use hand sanitizer. °¨ Change your dressing as told by your health care provider. °¨ Leave stitches (sutures), skin glue, or adhesive strips in place. These skin closures may need to stay in place for 2 weeks or longer. If adhesive strip edges start to loosen and curl up, you may trim the loose edges. Do not remove adhesive strips completely unless your health care provider tells you to do that. °· Check your incision areas every day for signs of infection. Check for: °¨ More redness, swelling, or pain. °¨ More fluid or blood. °¨ Warmth. °¨ Pus or a bad smell. °Other Instructions  °· If you were sent home with a drain, follow instructions from your health care provider about how to care for the drain and how to empty it. °· Take deep breaths. This helps to prevent your lungs from becoming inflamed. °· To relieve and prevent constipation: °¨ Drink plenty of fluids. °¨ Eat plenty of fruits and vegetables. °· Keep all follow-up visits as told by your health care provider. This is important. °Contact a health care provider if: °· You have more redness, swelling, or pain around an incision. °· You have more fluid or blood coming from an incision. °· Your incision feels warm to the touch. °· You have pus or a bad smell coming from an incision or   dressing. °· Your incision edges break open after your sutures have been removed. °· You have increasing pain in your shoulders. °· You feel dizzy or you faint. °· You develop shortness of breath. °· You keep feeling nauseous or vomiting. °· You have diarrhea or you cannot control your bowel functions. °· You lose your appetite. °· You develop swelling or pain in your legs. °Get help right away if: °· You have a fever. °· You develop a rash. °· You have difficulty breathing. °· You have sharp pains in your  chest. °This information is not intended to replace advice given to you by your health care provider. Make sure you discuss any questions you have with your health care provider. °Document Released: 06/17/2005 Document Revised: 11/17/2015 Document Reviewed: 12/05/2014 °Elsevier Interactive Patient Education © 2017 [employer] ° °

## 2016-10-29 NOTE — Anesthesia Procedure Notes (Signed)
Procedure Name: Intubation Date/Time: 10/29/2016 9:01 AM Performed by: Pernell Dupre, Tsugio Elison A Pre-anesthesia Checklist: Patient identified, Patient being monitored, Timeout performed, Emergency Drugs available and Suction available Patient Re-evaluated:Patient Re-evaluated prior to inductionOxygen Delivery Method: Circle System Utilized Preoxygenation: Pre-oxygenation with 100% oxygen Intubation Type: IV induction, Rapid sequence and Cricoid Pressure applied Ventilation: Mask ventilation without difficulty Laryngoscope Size: Miller and 3 Grade View: Grade I Tube type: Oral Tube size: 7.0 mm Number of attempts: 1 Airway Equipment and Method: Stylet Placement Confirmation: ETT inserted through vocal cords under direct vision,  positive ETCO2 and breath sounds checked- equal and bilateral Secured at: 21 cm Tube secured with: Tape Dental Injury: Teeth and Oropharynx as per pre-operative assessment

## 2016-10-29 NOTE — Transfer of Care (Signed)
Immediate Anesthesia Transfer of Care Note  Patient: [name]  Procedure(s) Performed: Procedure(s): APPENDECTOMY LAPAROSCOPIC (N/A)  Patient Location: PACU  Anesthesia Type:General  Level of Consciousness: awake, oriented and patient cooperative  Airway & Oxygen Therapy: Patient Spontanous Breathing and Patient connected to face mask oxygen  Post-op Assessment: Report given to RN and Post -op Vital signs reviewed and stable  Post vital signs: Reviewed and stable  Last Vitals:  Vitals:   10/29/16 0849 10/29/16 0953  BP: (!) 106/57 (P) 118/72  Pulse:  (!) (P) 104  Resp: 18 (!) (P) 21  Temp:  (P) 36.8 C    Last Pain:  Vitals:   10/29/16 0819  TempSrc: Oral  PainSc: 9       Patients Stated Pain Goal: 9 (10/29/16 0819)  Complications: No apparent anesthesia complications

## 2016-10-29 NOTE — ED Provider Notes (Signed)
[facility]PT Provider Note   CSN: [id number] Arrival date & time: 10/29/16  0423  Time seen 5:00 AM    History   Chief Complaint Chief Complaint  Patient presents with  . Abdominal Pain    HPI [name] is a 31 y.o. female.  HPI patient reports after dinner tonight about 5 PM she started having a mild central abdominal achiness. However about 11 PM she tried to go to bed and she started having worsening severe right lower quadrant pain. She has had nausea and vomiting. She states she tried changing positions thinking she had a gas pocket that wasn't moving without improvement. She states nothing makes the pain feel better. She states she's having difficulty walking due to the pain. She states about 3 PM this afternoon she was having a lot of pain in her right flank area. She denies any blood in her urine, urinary frequency or dysuria. She states she's never had this pain before. She has never heard of any family members having kidney stones. Patient is a moderate coffee drinker and drinks 3-4 cups a day. She denies being a large milk drinker.  Patient has had a hysterectomy.  Past Medical History:  Diagnosis Date  . 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 Bipolar disorder (HCC)   . Childhood asthma    seasonal with allergies  . Complication of anesthesia    hard to wake up post-op  . Family history of adverse reaction to anesthesia    pt's father has hx. of being hard to wake up post-op  . History of MRSA infection    axilla  . 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 Panic attacks   . PTSD (post-traumatic stress disorder)   . Shoulder dislocation 06/2015   right    Patient Active Problem List   Diagnosis Date Noted  . 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 Endometriosis 09/16/2012  . CERVICALGIA 09/08/2007  . CERVICAL SPASM 09/08/2007    Past Surgical History:  Procedure Laterality Date  . CESAREAN  SECTION  09/05/2008  . CYSTOSCOPY N/A 09/15/2012   Procedure: CYSTOSCOPY;  Surgeon: Leslie Andrea, MD;  Location: [facility];  Service: Gynecology;  Laterality: N/A;  . DILATION AND EVACUATION  04/15/2007  . HYSTEROSCOPY W/D&C  03/31/2012   Procedure: DILATATION AND CURETTAGE /HYSTEROSCOPY;  Surgeon: Leslie Andrea, MD;  Location: [facility];  Service: Gynecology;  Laterality: N/A;  . LAPAROSCOPIC ASSISTED VAGINAL HYSTERECTOMY N/A 09/15/2012   Procedure: LAPAROSCOPIC ASSISTED VAGINAL HYSTERECTOMY;  Surgeon: Leslie Andrea, MD;  Location: [facility];  Service: Gynecology;  Laterality: N/A;  . LAPAROSCOPY  03/31/2012   Procedure: LAPAROSCOPY OPERATIVE;  Surgeon: Leslie Andrea, MD;  Location: [facility];  Service: Gynecology;  Laterality: N/A;  with Biopsies of Bilateral Fallopian Tubes; Fulguration of Endometriosis  . SHOULDER ARTHROSCOPY WITH CAPSULORRHAPHY Right 06/28/2015   Procedure: RIGHT SHOULDER ARTHROSCOPY WITH CAPSULORRHAPHY;  Surgeon: Frederico Hamman, MD;  Location: [facility];  Service: Orthopedics;  Laterality: Right;  . SUBOCCIPITAL CRANIECTOMY CERVICAL LAMINECTOMY N/A 08/08/2016   Procedure: SUBOCCIPITAL CRANIECTOMY CERVICAL LAMINECTOMY NUMBER DURAPLASTY;  Surgeon: Tressie Stalker, MD;  Location: [facility];  Service: Neurosurgery;  Laterality: N/A;  suboccipital  . ULNAR NERVE REPAIR Right 05/2015    OB History    Gravida Para Term Preterm AB Living   SAB TAB Ectopic Multiple Live Births   1  Home Medications    Prior to Admission medications   Medication Sig Start Date End Date Taking? Authorizing Provider  docusate sodium (COLACE) 100 MG capsule Take 1 capsule (100 mg total) by mouth every 12 (twelve) hours. 07/03/16   Doug Sou, MD  HYDROcodone-acetaminophen (NORCO/VICODIN) 5-325 MG tablet Take 1-2 tablets by mouth every 4 (four) hours as needed for moderate pain. 08/10/16   Darci Current Costella, PA-C  levETIRAcetam (KEPPRA) 500 MG tablet  Take 1 tablet (500 mg total) by mouth 2 (two) times daily. 08/10/16 08/17/16  Darci Current Costella, PA-C  linaclotide (LINZESS) 145 MCG CAPS capsule Take 145 mcg by mouth daily as needed (constipation).    Historical Provider, MD  nicotine (NICODERM CQ - DOSED IN MG/24 HOURS) 14 mg/24hr patch Place 14 mg onto the skin daily as needed (smoking cessation).    Historical Provider, MD  propranolol (INDERAL) 10 MG tablet Take 10 mg by mouth 2 (two) times daily.    Historical Provider, MD  scopolamine (TRANSDERM-SCOP) 1 MG/3DAYS Place 1 patch (1.5 mg total) onto the skin every 3 (three) days. 08/11/16   Vincent J Costella, PA-C  sertraline (ZOLOFT) 50 MG tablet Take 50 mg by mouth at bedtime.    Historical Provider, MD  tiZANidine (ZANAFLEX) 4 MG tablet Take 1 tablet (4 mg total) by mouth 3 (three) times daily. 08/10/16 08/10/17  Alyson Ingles, PA-C    Family History Family History  Problem Relation Age of Onset  . Anesthesia problems Father     hard to wake up post-op    Social History Social History  Substance Use Topics  . Smoking status: Current Every Day Smoker    Packs/day: 1.00    Years: 10.00    Types: Cigarettes  . Smokeless tobacco: Never Used  . Alcohol use No     Comment: occasionally     Allergies   Dilaudid [hydromorphone hcl]; Adhesive [tape]; and Morphine and related   Review of Systems Review of Systems  All other systems reviewed and are negative.    Physical Exam Updated Vital Signs BP 122/90 (BP Location: Left Arm)   Pulse 85   Temp 98.3 F (36.8 C) (Oral)   Resp 17   Ht  (1.676 m)   Wt 193 lb (87.5 kg)   LMP 08/18/2012   SpO2 100%   BMI 31.15 kg/m   Vital signs normal    Physical Exam  Constitutional: She is oriented to person, place, and time. She appears well-developed and well-nourished.  Non-toxic appearance. She does not appear ill. She appears distressed.  HENT:  Head: Normocephalic and atraumatic.  Right Ear: External ear normal.    Left Ear: External ear normal.  Nose: Nose normal. No mucosal edema or rhinorrhea.  Mouth/Throat: Oropharynx is clear and moist and mucous membranes are normal. No dental abscesses or uvula swelling.  Eyes: Conjunctivae and EOM are normal. Pupils are equal, round, and reactive to light.  Neck: Normal range of motion and full passive range of motion without pain. Neck supple.  Cardiovascular: Normal rate, regular rhythm and normal heart sounds.  Exam reveals no gallop and no friction rub.   No murmur heard. Pulmonary/Chest: Effort normal and breath sounds normal. No respiratory distress. She has no wheezes. She has no rhonchi. She has no rales. She exhibits no tenderness and no crepitus.  Abdominal: Soft. Normal appearance and bowel sounds are normal. She exhibits no distension. There is no tenderness. There is no rebound and no guarding.  Patient has mild tenderness in her right upper quadrant however she has extreme tenderness to palpation even to light pressure in the right lower quadrant.  Musculoskeletal: Normal range of motion. She exhibits no edema or tenderness.  Moves all extremities well.   Neurological: She is alert and oriented to person, place, and time. She has normal strength. No cranial nerve deficit.  Skin: Skin is warm, dry and intact. No rash noted. No erythema. No pallor.  Psychiatric: She has a normal mood and affect. Her speech is normal and behavior is normal. Her mood appears not anxious.  Nursing note and vitals reviewed.    ED Treatments / Results  Labs (all labs ordered are listed, but only abnormal results are displayed) Results for orders placed or performed during the hospital encounter of 10/29/16  Comprehensive metabolic panel  Result Value Ref Range   Sodium 136 135 - 145 mmol/L   Potassium 3.7 3.5 - 5.1 mmol/L   Chloride 105 101 - 111 mmol/L   CO2 23 22 - 32 mmol/L   Glucose, Bld 104 (H) 65 - 99 mg/dL   BUN 11 6 - 20 mg/dL   Creatinine, Ser 1.61  0.44 - 1.00 mg/dL   Calcium 8.8 (L) 8.9 - 10.3 mg/dL   Total Protein 6.7 6.5 - 8.1 g/dL   Albumin 4.0 3.5 - 5.0 g/dL   AST 19 15 - 41 U/L   ALT 17 14 - 54 U/L   Alkaline Phosphatase 52 38 - 126 U/L   Total Bilirubin 0.4 0.3 - 1.2 mg/dL   GFR calc non Af Amer >60 >60 mL/min   GFR calc Af Amer >60 >60 mL/min   Anion gap 8 5 - 15  Lipase, blood  Result Value Ref Range   Lipase 23 11 - 51 U/L  CBC with Differential  Result Value Ref Range   WBC 14.2 (H) 4.0 - 10.5 K/uL   RBC 4.52 3.87 - 5.11 MIL/uL   Hemoglobin 14.7 12.0 - 15.0 g/dL   HCT 09.6 04.5 - 40.9 %   MCV 96.0 78.0 - 100.0 fL   MCH 32.5 26.0 - 34.0 pg   MCHC 33.9 30.0 - 36.0 g/dL   RDW 81.1 91.4 - 78.2 %   Platelets 226 150 - 400 K/uL   Neutrophils Relative % 85 %   Neutro Abs 12.1 (H) 1.7 - 7.7 K/uL   Lymphocytes Relative 9 %   Lymphs Abs 1.3 0.7 - 4.0 K/uL   Monocytes Relative 5 %   Monocytes Absolute 0.7 0.1 - 1.0 K/uL   Eosinophils Relative 1 %   Eosinophils Absolute 0.1 0.0 - 0.7 K/uL   Basophils Relative 0 %   Basophils Absolute 0.0 0.0 - 0.1 K/uL  Urinalysis, Routine w reflex microscopic  Result Value Ref Range   Color, Urine YELLOW YELLOW   APPearance HAZY (A) CLEAR   Specific Gravity, Urine 1.023 1.005 - 1.030   pH 5.0 5.0 - 8.0   Glucose, UA NEGATIVE NEGATIVE mg/dL   Hgb urine dipstick SMALL (A) NEGATIVE   Bilirubin Urine NEGATIVE NEGATIVE   Ketones, ur NEGATIVE NEGATIVE mg/dL   Protein, ur NEGATIVE NEGATIVE mg/dL   Nitrite NEGATIVE NEGATIVE   Leukocytes, UA NEGATIVE NEGATIVE   RBC / HPF 0-5 0 - 5 RBC/hpf   WBC, UA 0-5 0 - 5 WBC/hpf   Bacteria, UA RARE (A) NONE SEEN   Squamous Epithelial / LPF 0-5 (A) NONE SEEN   Mucous PRESENT    Hyaline Casts,  UA PRESENT   Pregnancy, urine  Result Value Ref Range   Preg Test, Ur NEGATIVE NEGATIVE   Laboratory interpretation all normal except Leukocytosis    EKG  EKG Interpretation None       Radiology Ct Abdomen Pelvis W Contrast  Result Date:  10/29/2016 CLINICAL DATA:  Initial evaluation for acute mid and right lower quadrant abdominal pain. EXAM: CT ABDOMEN AND PELVIS WITH CONTRAST TECHNIQUE: Multidetector CT imaging of the abdomen and pelvis was performed using the standard protocol following bolus administration of intravenous contrast. CONTRAST:  30mL ISOVUE-300 IOPAMIDOL (ISOVUE-300) INJECTION 61%, ISOVUE-300 IOPAMIDOL (ISOVUE-300) INJECTION 61% COMPARISON:  None. FINDINGS: Lower chest: Bibasilar atelectasis present dependently within the visualized lung bases. Visualized lungs are otherwise clear. Hepatobiliary: Scattered hypodense lesions present within the liver, indeterminate, but may reflect cysts or possibly hemangiomas. These are of doubtful significance. Liver otherwise unremarkable. Gallbladder within normal limits. No biliary dilatation. Pancreas: Pancreas within normal limits. Spleen: 2 small hypodensities measuring up to 13 mm present within the spleen, indeterminate, but may reflect small cyst. Spleen otherwise unremarkable. Adrenals/Urinary Tract: Adrenal glands normal. Kidneys equal in size with symmetric enhancement. 5 mm nonobstructive stone present within the left kidney. Few scattered subcentimeter hypodensities noted, indeterminate, but statistically likely reflects small cyst. No focal enhancing renal mass. No hydronephrosis. No hydroureter. Partially distended bladder within normal limits. Stomach/Bowel: Stomach within normal limits. No evidence for bowel obstruction. Appendix is dilated up to 10 mm. Associated mucosal enhancement with periappendiceal inflammatory stranding. Findings consistent with acute appendicitis. No evidence for perforation or other complication. No other acute inflammatory changes seen about the bowels. Vascular/Lymphatic: Normal intravascular enhancement seen throughout the intra-abdominal aorta and its branch vessels. No pathologically enlarged intra-abdominal or pelvic lymph nodes. Reproductive:  Uterus is absent.  Ovaries within normal limits. Other: Small volume free fluid within the pelvis, which may be physiologic or reactive. No free intraperitoneal air. Musculoskeletal: No acute osseous abnormality. No worrisome lytic or blastic osseous lesions. IMPRESSION: 1. Findings consistent with acute appendicitis. No complication identified. 2. Small volume free fluid within the pelvis, which may be physiologic and/or reactive. 3. 5 mm nonobstructive left renal nephrolithiasis. 4. Scattered hypodensities within the liver and spleen, indeterminate, but favored to reflect small cysts. Electronically Signed   By: Rise Mu [name]   On: 10/29/2016 07:06    Procedures Procedures (including critical care time)  Medications Ordered in ED Medications  cefTRIAXone (ROCEPHIN) 2 g in dextrose 5 % 50 mL IVPB (not administered)    And  metroNIDAZOLE (FLAGYL) IVPB 500 mg (not administered)  nicotine (NICODERM CQ - dosed in mg/24 hours) patch 21 mg (not administered)  sodium chloride 0.9 % bolus 1,000 mL (1,000 mLs Intravenous New Bag/Given 10/29/16 0517)  ondansetron (ZOFRAN) injection 4 mg (4 mg Intravenous Given 10/29/16 0517)  fentaNYL (SUBLIMAZE) injection 50 mcg (50 mcg Intravenous Given 10/29/16 0525)  iopamidol (ISOVUE-300) 61 % injection (30 mLs  Contrast Given 10/29/16 0556)  iopamidol (ISOVUE-300) 61 % injection 100 mL (100 mLs Intravenous Contrast Given 10/29/16 0556)     Initial Impression / Assessment and Plan / ED Course  I have reviewed the triage vital signs and the nursing notes.  Pertinent labs & imaging results that were available during my care of the patient were reviewed by me and considered in my medical decision making (see chart for details).  Patient was given IV fluids and IV nausea and pain medication. CT scan was done to look for appendicitis or even possibly ovarian  torsion. Less likely would be a renal stone, generally they do not present with that degree of tenderness to  palpation.  7:10 AM patient CT resulted. Results were discussed with patient. She was started on IV antibiotics, Rocephin and metronidazole. Consult to surgery was placed.  07:23 AM Dr Lovell Sheehan, will come see patient.   Final Clinical Impressions(s) / ED Diagnoses   Final diagnoses:  Acute appendicitis, unspecified acute appendicitis type    Plan admission  Devoria Albe, MD, Concha Pyo, MD 10/29/16 [phone]

## 2016-10-29 NOTE — Anesthesia Postprocedure Evaluation (Signed)
Anesthesia Post Note Late Entry for 1005  Patient: [name]  Procedure(s) Performed: Procedure(s) (LRB): APPENDECTOMY LAPAROSCOPIC (N/A)  Patient location during evaluation: PACU Anesthesia Type: General Level of consciousness: awake and alert and oriented Pain management: pain level controlled Vital Signs Assessment: post-procedure vital signs reviewed and stable Respiratory status: spontaneous breathing and patient connected to face mask oxygen Cardiovascular status: stable Postop Assessment: no signs of nausea or vomiting Anesthetic complications: no     Last Vitals:  Vitals:   10/29/16 1000 10/29/16 1015  BP: 114/78 105/68  Pulse: 99 83  Resp: 14 16  Temp:      Last Pain:  Vitals:   10/29/16 1015  TempSrc:   PainSc: (P) 3                  Tiffani Kadow A

## 2016-10-30 ENCOUNTER — Encounter (HOSPITAL_COMMUNITY): Payer: Self-pay | Admitting: General Surgery

## 2016-11-05 ENCOUNTER — Ambulatory Visit (INDEPENDENT_AMBULATORY_CARE_PROVIDER_SITE_OTHER): Payer: Self-pay | Admitting: General Surgery

## 2016-11-05 ENCOUNTER — Encounter (HOSPITAL_COMMUNITY): Payer: Self-pay | Admitting: General Surgery

## 2016-11-05 VITALS — BP 114/65 | HR 86 | Temp 98.9°F | Resp 18 | Ht 66.0 in | Wt 196.0 lb

## 2016-11-05 DIAGNOSIS — Z09 Encounter for follow-up examination after completed treatment for conditions other than malignant neoplasm: Secondary | ICD-10-CM

## 2016-11-05 NOTE — Progress Notes (Signed)
Subjective:     [name]  Status post laparoscopic appendectomy. Doing well. No complaints. Objective:    BP 114/65   Pulse 86   Temp 98.9 F (37.2 C)   Resp 18   Ht 5\' 6"  (1.676 m)   Wt 196 lb (88.9 kg)   LMP 08/18/2012   BMI 31.64 kg/m   General:  alert, cooperative and no distress  Abdomen soft, incisions healing well. Staples removed, Steri-Strips applied.    Final pathology reveals acute appendicitis. Assessment:    Doing well postoperatively.    Plan:  Resume normal activity. Follow-up as needed.

## 2016-11-06 ENCOUNTER — Encounter (HOSPITAL_COMMUNITY): Payer: Self-pay | Admitting: General Surgery

## 2016-11-10 ENCOUNTER — Emergency Department (HOSPITAL_COMMUNITY)
Admission: EM | Admit: 2016-11-10 | Discharge: 2016-11-10 | Disposition: A | Discharge status: Home / Self Care | Payer: Medicaid Other | Attending: Emergency Medicine | Admitting: Emergency Medicine

## 2016-11-10 ENCOUNTER — Emergency Department (HOSPITAL_COMMUNITY): Payer: Medicaid Other

## 2016-11-10 ENCOUNTER — Encounter (HOSPITAL_COMMUNITY): Payer: Self-pay | Admitting: Emergency Medicine

## 2016-11-10 DIAGNOSIS — F17210 Nicotine dependence, cigarettes, uncomplicated: Secondary | ICD-10-CM | POA: Insufficient documentation

## 2016-11-10 DIAGNOSIS — J45909 Unspecified asthma, uncomplicated: Secondary | ICD-10-CM | POA: Diagnosis not present

## 2016-11-10 DIAGNOSIS — G9389 Other specified disorders of brain: Secondary | ICD-10-CM | POA: Diagnosis not present

## 2016-11-10 DIAGNOSIS — H5704 Mydriasis: Secondary | ICD-10-CM

## 2016-11-10 DIAGNOSIS — H578 Other specified disorders of eye and adnexa: Secondary | ICD-10-CM | POA: Diagnosis present

## 2016-11-10 DIAGNOSIS — Z79899 Other long term (current) drug therapy: Secondary | ICD-10-CM | POA: Diagnosis not present

## 2016-11-10 HISTORY — DX: Headache: R51

## 2016-11-10 HISTORY — DX: Cervicalgia: M54.2

## 2016-11-10 HISTORY — DX: Headache, unspecified: R51.9

## 2016-11-10 NOTE — ED Provider Notes (Signed)
[facility] Provider Note   CSN: [id number] Arrival date & time: 11/10/16  2010     History   Chief Complaint Chief Complaint  Patient presents with  . Eye Problem    HPI [name] is a 31 y.o. female.  HPI  Pt was seen at 2030. Per pt, c/o unknown onset and persistence of constant right pupil "dilated" that was noticed PTA. Pt states she was out in the yard all day mowing grass, etc. States she took a shower and was cutting her son's nails when she noticed that her right vision was "blurry" and her eye felt sensitive to the light. Pt states she looked in the mirror and saw her right pupil dilated. Pt states she has never noticed that before. Denies eye pain, no eye injury, no double vision, no headache, no N/V/D, no focal motor weakness, no tingling/numbness in extremities.   Past Medical History:  Diagnosis Date  . 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 Bipolar disorder (HCC)   . Cervicalgia   . Childhood asthma    seasonal with allergies  . Complication of anesthesia    hard to wake up post-op  . Family history of adverse reaction to anesthesia    pt's father has hx. of being hard to wake up post-op  . Headache   . History of MRSA infection    axilla  . 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 Panic attacks   . PTSD (post-traumatic stress disorder)   . Shoulder dislocation 06/2015   right    Patient Active Problem List   Diagnosis Date Noted  . Acute appendicitis   . 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 Endometriosis 09/16/2012  . CERVICALGIA 09/08/2007  . CERVICAL SPASM 09/08/2007    Past Surgical History:  Procedure Laterality Date  . CESAREAN SECTION  09/05/2008  . CYSTOSCOPY N/A 09/15/2012   Procedure: CYSTOSCOPY;  Surgeon: Leslie Andrea, MD;  Location: [facility];  Service: Gynecology;  Laterality: N/A;  . DILATION AND EVACUATION  04/15/2007  . HYSTEROSCOPY W/D&C  03/31/2012   Procedure: DILATATION AND CURETTAGE /HYSTEROSCOPY;  Surgeon: Leslie Andrea, MD;  Location: [facility];  Service: Gynecology;  Laterality: N/A;  . LAPAROSCOPIC APPENDECTOMY N/A 10/29/2016   Procedure: APPENDECTOMY LAPAROSCOPIC;  Surgeon: Franky Macho, MD;  Location: [facility];  Service: General;  Laterality: N/A;  . LAPAROSCOPIC ASSISTED VAGINAL HYSTERECTOMY N/A 09/15/2012   Procedure: LAPAROSCOPIC ASSISTED VAGINAL HYSTERECTOMY;  Surgeon: Leslie Andrea, MD;  Location: [facility];  Service: Gynecology;  Laterality: N/A;  . LAPAROSCOPY  03/31/2012   Procedure: LAPAROSCOPY OPERATIVE;  Surgeon: Leslie Andrea, MD;  Location: [facility];  Service: Gynecology;  Laterality: N/A;  with Biopsies of Bilateral Fallopian Tubes; Fulguration of Endometriosis  . SHOULDER ARTHROSCOPY WITH CAPSULORRHAPHY Right 06/28/2015   Procedure: RIGHT SHOULDER ARTHROSCOPY WITH CAPSULORRHAPHY;  Surgeon: Frederico Hamman, MD;  Location: [facility];  Service: Orthopedics;  Laterality: Right;  . SUBOCCIPITAL CRANIECTOMY CERVICAL LAMINECTOMY N/A 08/08/2016   Procedure: SUBOCCIPITAL CRANIECTOMY CERVICAL LAMINECTOMY NUMBER DURAPLASTY;  Surgeon: Tressie Stalker, MD;  Location: [facility];  Service: Neurosurgery;  Laterality: N/A;  suboccipital  . ULNAR NERVE REPAIR Right 05/2015    OB History    Gravida Para Term Preterm AB Living   2 1 1   1 1    SAB TAB Ectopic Multiple Live Births   1  Home Medications    Prior to Admission medications   Medication Sig Start Date End Date Taking? Authorizing Provider  docusate sodium (COLACE) 100 MG capsule Take 1 capsule (100 mg total) by mouth every 12 (twelve) hours. 07/03/16   Doug SouJacubowitz, Sam, MD  levETIRAcetam (KEPPRA) 500 MG tablet Take 1 tablet (500 mg total) by mouth 2 (two) times daily. 08/10/16 08/17/16  Costella, Darci CurrentVincent J, PA-C  linaclotide (LINZESS) 145 MCG CAPS capsule Take 145 mcg by mouth daily as needed (constipation).    [provider]  nicotine  (NICODERM CQ - DOSED IN MG/24 HOURS) 14 mg/24hr patch Place 14 mg onto the skin daily as needed (smoking cessation).    [provider]  oxyCODONE-acetaminophen (PERCOCET) 7.5-325 MG tablet Take 1-2 tablets by mouth every 4 (four) hours as needed. 10/29/16   Franky MachoJenkins, Mark, MD  propranolol (INDERAL) 10 MG tablet Take 10 mg by mouth 2 (two) times daily.    [provider]  scopolamine (TRANSDERM-SCOP) 1 MG/3DAYS Place 1 patch (1.5 mg total) onto the skin every 3 (three) days. 08/11/16   Costella, Darci CurrentVincent J, PA-C  sertraline (ZOLOFT) 50 MG tablet Take 50 mg by mouth at bedtime.    [provider]  tiZANidine (ZANAFLEX) 4 MG tablet Take 1 tablet (4 mg total) by mouth 3 (three) times daily. 08/10/16 08/10/17  Costella, Darci CurrentVincent J, PA-C    Family History Family History  Problem Relation Age of Onset  . Anesthesia problems Father        hard to wake up post-op    Social History Social History  Substance Use Topics  . Smoking status: Current Every Day Smoker    Packs/day: 1.00    Years: 10.00    Types: Cigarettes  . Smokeless tobacco: Never Used  . Alcohol use No     Comment: occasionally     Allergies   Dilaudid [hydromorphone hcl]; Adhesive [tape]; and Morphine and related   Review of Systems Review of Systems ROS: Statement: All systems negative except as marked or noted in the HPI; Constitutional: Negative for fever and chills. ; ; Eyes: Negative for eye pain, redness and discharge. +right pupil dilated.; ; ENMT: Negative for ear pain, hoarseness, nasal congestion, sinus pressure and sore throat. ; ; Cardiovascular: Negative for chest pain, palpitations, diaphoresis, dyspnea and peripheral edema. ; ; Respiratory: Negative for cough, wheezing and stridor. ; ; Gastrointestinal: Negative for nausea, vomiting, diarrhea, abdominal pain, blood in stool, hematemesis, jaundice and rectal bleeding. . ; ; Genitourinary: Negative for dysuria, flank pain and hematuria. ; ;  Musculoskeletal: Negative for back pain and neck pain. Negative for swelling and trauma.; ; Skin: Negative for pruritus, rash, abrasions, blisters, bruising and skin lesion.; ; Neuro: Negative for headache, lightheadedness and neck stiffness. Negative for weakness, altered level of consciousness, altered mental status, extremity weakness, paresthesias, involuntary movement, seizure and syncope.       Physical Exam Updated Vital Signs BP 125/72 (BP Location: Left Arm)   Pulse (!) 104   Temp 98.5 F (36.9 C) (Oral)   Resp 18   Ht 5\' 6"  (1.676 m)   Wt 195 lb (88.5 kg)   LMP 08/18/2012   SpO2 100%   BMI 31.47 kg/m   Physical Exam 2035: Physical examination:  Nursing notes reviewed; Vital signs and O2 SAT reviewed;  Constitutional: Well developed, Well nourished, Well hydrated, In no acute distress; Head:  Normocephalic, atraumatic; Eyes: EOMI, RRL, +right pupil dilated 6-337mm and sluggishly reactive. +left pupil 3-344mm, briskly  reactive.; Extraocular movement: Bilateral normal, No nystagmus. ; Eyelid: Bilateral normal, No edema.  No ptosis. ; Conjunctiva and sclera: Bilateral normal, No conjunctival injection, no chemosis, no discharge.  No obvious hyphema or hypopion.; ; Retina: Bilateral normal, Non-dilated fundoscopic exam without obvious papilledema or hemorrhage. ;; Diagnostic instrument: Ophthalmoscope;;; ENMT: Mouth and pharynx normal, Mucous membranes moist; Neck: Supple, Full range of motion, No lymphadenopathy; Cardiovascular: Regular rate and rhythm, No gallop; Respiratory: Breath sounds clear & equal bilaterally, No wheezes.  Speaking full sentences with ease, Normal respiratory effort/excursion; Chest: Nontender, Movement normal; Abdomen: Soft, Nontender, Nondistended, Normal bowel sounds; Genitourinary: No CVA tenderness; Extremities: Pulses normal, No tenderness, No edema, No calf edema or asymmetry.; Neuro: AA&Ox3, Major CN grossly intact. No facial droop. Speech clear. No gross focal  motor or sensory deficits in extremities. Climbs on and off stretcher easily by herself. Gait steady.; Skin: Color normal, Warm, Dry.   ED Treatments / Results  Labs (all labs ordered are listed, but only abnormal results are displayed)   EKG  EKG Interpretation None       Radiology   Procedures Procedures (including critical care time)  Medications Ordered in ED Medications - No data to display   Initial Impression / Assessment and Plan / ED Course  I have reviewed the triage vital signs and the nursing notes.  Pertinent labs & imaging results that were available during my care of the patient were reviewed by me and considered in my medical decision making (see chart for details).  MDM Reviewed: previous chart, nursing note and vitals Interpretation: CT scan   Ct Head Wo Contrast Result Date: 11/10/2016 CLINICAL DATA:  Initial evaluation for on equal pupils, right pupil is dilated. EXAM: CT HEAD WITHOUT CONTRAST TECHNIQUE: Contiguous axial images were obtained from the base of the skull through the vertex without intravenous contrast. COMPARISON:  None available. FINDINGS: Brain: Cerebral volume within normal limits. No acute intracranial hemorrhage. No evidence for acute large vessel territory infarct. No mass lesion, midline shift or mass effect. No hydrocephalus. No extra-axial fluid collection. Patient is status post decompressive suboccipital craniectomy for Chiari 1 malformation. Vascular: No hyperdense vessel. Skull: Scalp soft tissues and calvarium demonstrate no acute abnormality. Sinuses/Orbits: Globes and orbital soft tissues demonstrate no acute abnormality. Visualized paranasal sinuses are clear. No mastoid effusion. Other: None. IMPRESSION: 1. No acute intracranial process. 2. Status post decompressive suboccipital craniectomy for Chiari 1 malformation. 3. Otherwise normal head CT. Electronically Signed   By: Rise Mu [name]   On: 11/10/2016 21:51      2205:  CT reassuring.  T/C to Opht[facility] Dr. Genia Del, case discussed, including:  HPI, pertinent PM/SHx, VS/PE, dx testing, ED course and treatment:  Agrees with ED workup, symptoms likely due to touching substance/touching eye esp jimson weed if was mowing the lawn, symptoms will likely improve over the next 24 hours, have pt call office in the morning to be seen in follow up tomorrow. Dx and testing, as well as d/w Opht[facility] MD, d/w pt and family.  Questions answered.  Verb understanding, agreeable to d/c home with outpt f/u.   Final Clinical Impressions(s) / ED Diagnoses   Final diagnoses:  None    New Prescriptions New Prescriptions   No medications on file     Samuel Jester, DO 11/13/16 1610

## 2016-11-10 NOTE — Discharge Instructions (Signed)
Take your usual prescriptions as previously directed.  Your right pupil dilatation is probably due to something you have touched/gotten into your eye while mowing the lawn, and will probably improve over the next 24 hours. Call the Eye doctor tomorrow morning to schedule a follow up appointment tomorrow.  Return to the Emergency Department immediately sooner if worsening.

## 2016-11-10 NOTE — ED Triage Notes (Signed)
Unequal pupils,  Rt pupil is dilated, lt pupil is normal

## 2017-01-27 ENCOUNTER — Telehealth: Payer: Self-pay | Admitting: Neurology

## 2017-01-27 ENCOUNTER — Encounter: Payer: Self-pay | Admitting: Neurology

## 2017-01-27 ENCOUNTER — Ambulatory Visit (INDEPENDENT_AMBULATORY_CARE_PROVIDER_SITE_OTHER): Payer: Medicaid Other | Admitting: Neurology

## 2017-01-27 VITALS — BP 114/77 | HR 66 | Ht 66.0 in | Wt 206.0 lb

## 2017-01-27 DIAGNOSIS — G43009 Migraine without aura, not intractable, without status migrainosus: Secondary | ICD-10-CM | POA: Diagnosis not present

## 2017-01-27 DIAGNOSIS — Z8669 Personal history of other diseases of the nervous system and sense organs: Secondary | ICD-10-CM

## 2017-01-27 DIAGNOSIS — R0681 Apnea, not elsewhere classified: Secondary | ICD-10-CM

## 2017-01-27 DIAGNOSIS — R0683 Snoring: Secondary | ICD-10-CM

## 2017-01-27 DIAGNOSIS — R51 Headache with orthostatic component, not elsewhere classified: Secondary | ICD-10-CM

## 2017-01-27 DIAGNOSIS — H539 Unspecified visual disturbance: Secondary | ICD-10-CM

## 2017-01-27 DIAGNOSIS — R519 Headache, unspecified: Secondary | ICD-10-CM

## 2017-01-27 MED ORDER — NORTRIPTYLINE HCL 10 MG PO CAPS: 10.0000 mg | ORAL_CAPSULE | Freq: Every day | ORAL | 3 refills | Status: DC

## 2017-01-27 NOTE — Telephone Encounter (Signed)
Patient requests ativan for mri.

## 2017-01-27 NOTE — Patient Instructions (Signed)
Remember to drink plenty of fluid, eat healthy meals and do not skip any meals. Try to eat protein with a every meal and eat a healthy snack such as fruit or nuts in between meals. Try to keep a regular sleep-wake schedule and try to exercise daily, particularly in the form of walking, 20-30 minutes a day, if you can.   As far as your medications are concerned, I would like to suggest: Nortriptyline 10mg  an hour before bed  As far as diagnostic testing: MRI brain, sleep eval  I would like to see you back in 8 weeks, sooner if we need to. Please call us with any interim questions, concerns, problems, updates or refill requests.   Our phone number is [phone]. We also have an after hours call service for urgent matters and there is a physician on-call for urgent questions. For any emergencies you know to call 911 or go to the nearest emergency room  Nortriptyline capsules What is this medicine? NORTRIPTYLINE (nor TRIP ti leen) is used to treat depression. This medicine may be used for other purposes; ask your health care provider or pharmacist if you have questions. COMMON BRAND NAME(S): Aventyl, Pamelor What should I tell my health care provider before I take this medicine? They need to know if you have any of these conditions: -an alcohol problem -bipolar disorder or schizophrenia -difficulty passing urine, prostate trouble -glaucoma -heart disease or recent heart attack -liver disease -over active thyroid -seizures -thoughts or plans of suicide or a previous suicide attempt or family history of suicide attempt -an unusual or allergic reaction to nortriptyline, other medicines, foods, dyes, or preservatives -pregnant or trying to get pregnant -breast-feeding How should I use this medicine? Take this medicine by mouth with a glass of water. Follow the directions on the prescription label. Take your doses at regular intervals. Do not take it more often than directed. Do not stop  taking this medicine suddenly except upon the advice of your doctor. Stopping this medicine too quickly may cause serious side effects or your condition may worsen. A special MedGuide will be given to you by the pharmacist with each prescription and refill. Be sure to read this information carefully each time. Talk to your pediatrician regarding the use of this medicine in children. Special care may be needed. Overdosage: If you think you have taken too much of this medicine contact a poison control center or emergency room at once. NOTE: This medicine is only for you. Do not share this medicine with others. What if I miss a dose? If you miss a dose, take it as soon as you can. If it is almost time for your next dose, take only that dose. Do not take double or extra doses. What may interact with this medicine? Do not take this medicine with any of the following medications: -arsenic trioxide -certain medicines medicines for irregular heart beat -cisapride -halofantrine -linezolid -MAOIs like Carbex, Eldepryl, Marplan, Nardil, and Parnate -methylene blue (injected into a vein) -other medicines for mental depression -phenothiazines like perphenazine, thioridazine and chlorpromazine -pimozide -probucol -procarbazine -sparfloxacin -St. John's Wort -ziprasidone This medicine may also interact with any of the following medications: -atropine and related drugs like hyoscyamine, scopolamine, tolterodine and others -barbiturate medicines for inducing sleep or treating seizures, such as phenobarbital -cimetidine -medicines for diabetes -medicines for seizures like carbamazepine or phenytoin -reserpine -thyroid medicine This list may not describe all possible interactions. Give your health care provider a list of all the medicines, herbs, non-prescription  drugs, or dietary supplements you use. Also tell them if you smoke, drink alcohol, or use illegal drugs. Some items may interact with your  medicine. What should I watch for while using this medicine? Tell your doctor if your symptoms do not get better or if they get worse. Visit your doctor or health care professional for regular checks on your progress. Because it may take several weeks to see the full effects of this medicine, it is important to continue your treatment as prescribed by your doctor. Patients and their families should watch out for new or worsening thoughts of suicide or depression. Also watch out for sudden changes in feelings such as feeling anxious, agitated, panicky, irritable, hostile, aggressive, impulsive, severely restless, overly excited and hyperactive, or not being able to sleep. If this happens, especially at the beginning of treatment or after a change in dose, call your health care professional. Bonita QuinYou may get drowsy or dizzy. Do not drive, use machinery, or do anything that needs mental alertness until you know how this medicine affects you. Do not stand or sit up quickly, especially if you are an older patient. This reduces the risk of dizzy or fainting spells. Alcohol may interfere with the effect of this medicine. Avoid alcoholic drinks. Do not treat yourself for coughs, colds, or allergies without asking your doctor or health care professional for advice. Some ingredients can increase possible side effects. Your mouth may get dry. Chewing sugarless gum or sucking hard candy, and drinking plenty of water may help. Contact your doctor if the problem does not go away or is severe. This medicine may cause dry eyes and blurred vision. If you wear contact lenses you may feel some discomfort. Lubricating drops may help. See your eye doctor if the problem does not go away or is severe. This medicine can cause constipation. Try to have a bowel movement at least every 2 to 3 days. If you do not have a bowel movement for 3 days, call your doctor or health care professional. This medicine can make you more sensitive to the  sun. Keep out of the sun. If you cannot avoid being in the sun, wear protective clothing and use sunscreen. Do not use sun lamps or tanning beds/booths. What side effects may I notice from receiving this medicine? Side effects that you should report to your doctor or health care professional as soon as possible: -allergic reactions like skin rash, itching or hives, swelling of the face, lips, or tongue -anxious -breathing problems -changes in vision -confusion -elevated mood, decreased need for sleep, racing thoughts, impulsive behavior -eye pain -fast, irregular heartbeat -feeling faint or lightheaded, falls -feeling agitated, angry, or irritable -fever with increased sweating -hallucination, loss of contact with reality -seizures -stiff muscles -suicidal thoughts or other mood changes -tingling, pain, or numbness in the feet or hands -trouble passing urine or change in the amount of urine -trouble sleeping -unusually weak or tired -vomiting -yellowing of the eyes or skin Side effects that usually do not require medical attention (report to your doctor or health care professional if they continue or are bothersome): -change in sex drive or performance -change in appetite or weight -constipation -dizziness -dry mouth -nausea -tired -tremors -upset stomach This list may not describe all possible side effects. Call your doctor for medical advice about side effects. You may report side effects to FDA at 1-800-FDA-1088. Where should I keep my medicine? Keep out of the reach of children. Store at room temperature between 15 and  30 degrees C (59 and 86 degrees F). Keep container tightly closed. Throw away any unused medicine after the expiration date. NOTE: This sheet is a summary. It may not cover all possible information. If you have questions about this medicine, talk to your doctor, pharmacist, or health care provider.  2018 [employer]vier/Gold Standard (2015-11-17 12:53:08)

## 2017-01-27 NOTE — Telephone Encounter (Signed)
[name], patient needs open mri thanks
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
## 2017-01-28 ENCOUNTER — Other Ambulatory Visit: Payer: Self-pay | Admitting: Neurology

## 2017-01-28 MED ORDER — ALPRAZOLAM 0.5 MG PO TABS: ORAL_TABLET | ORAL | 0 refills | Status: DC

## 2017-01-28 NOTE — Telephone Encounter (Signed)
Rx printed signed and faxed to [facility].

## 2017-01-28 NOTE — Telephone Encounter (Signed)
jen please fax thanks

## 2017-01-29 ENCOUNTER — Encounter: Payer: Self-pay | Admitting: Neurology

## 2017-01-29 ENCOUNTER — Ambulatory Visit (INDEPENDENT_AMBULATORY_CARE_PROVIDER_SITE_OTHER): Payer: Medicaid Other | Admitting: Neurology

## 2017-01-29 VITALS — BP 115/73 | HR 75 | Ht 66.0 in | Wt 205.0 lb

## 2017-01-29 DIAGNOSIS — R0683 Snoring: Secondary | ICD-10-CM

## 2017-01-29 DIAGNOSIS — F410 Panic disorder [episodic paroxysmal anxiety] without agoraphobia: Secondary | ICD-10-CM | POA: Diagnosis not present

## 2017-01-29 DIAGNOSIS — F4310 Post-traumatic stress disorder, unspecified: Secondary | ICD-10-CM

## 2017-01-29 DIAGNOSIS — F5105 Insomnia due to other mental disorder: Secondary | ICD-10-CM | POA: Diagnosis not present
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
## 2017-01-29 NOTE — Patient Instructions (Signed)

## 2017-02-03 ENCOUNTER — Encounter: Payer: Self-pay | Admitting: Neurology

## 2017-02-03 NOTE — Telephone Encounter (Signed)
[facility].

## 2017-02-03 NOTE — Telephone Encounter (Signed)
Had new pt visit on 01/27/17 for migraines and was started on nortriptyline. Hx of PTSD for which she's been on Lamictal, also has h/o anxiety, tobacco abuse and s/p surg for Chiari malformation. She does not want to continue nortriptyline.

## 2017-02-13 ENCOUNTER — Ambulatory Visit
Admission: RE | Admit: 2017-02-13 | Discharge: 2017-02-13 | Disposition: A | Discharge status: Home / Self Care | Payer: Medicaid Other | Source: Ambulatory Visit | Attending: Neurology | Admitting: Neurology

## 2017-02-13 DIAGNOSIS — H539 Unspecified visual disturbance: Secondary | ICD-10-CM

## 2017-02-13 DIAGNOSIS — R0681 Apnea, not elsewhere classified: Secondary | ICD-10-CM

## 2017-02-13 DIAGNOSIS — R51 Headache with orthostatic component, not elsewhere classified: Secondary | ICD-10-CM

## 2017-02-13 DIAGNOSIS — R519 Headache, unspecified: Secondary | ICD-10-CM

## 2017-02-13 DIAGNOSIS — R0683 Snoring: Secondary | ICD-10-CM

## 2017-02-13 DIAGNOSIS — Z8669 Personal history of other diseases of the nervous system and sense organs: Secondary | ICD-10-CM

## 2017-02-13 MED ORDER — GADOBENATE DIMEGLUMINE 529 MG/ML IV SOLN
20.0000 mL | Freq: Once | INTRAVENOUS | Status: AC | PRN
  Administered 2017-02-13: 20 mL via INTRAVENOUS

## 2017-02-19 ENCOUNTER — Encounter: Payer: Self-pay | Admitting: Neurology

## 2017-03-24 ENCOUNTER — Encounter: Payer: Self-pay | Admitting: Neurology

## 2017-03-24 ENCOUNTER — Ambulatory Visit (INDEPENDENT_AMBULATORY_CARE_PROVIDER_SITE_OTHER): Payer: Medicaid Other | Admitting: Neurology

## 2017-03-24 VITALS — BP 114/67 | HR 61 | Ht 66.0 in | Wt 212.4 lb

## 2017-03-24 DIAGNOSIS — G4440 Drug-induced headache, not elsewhere classified, not intractable: Secondary | ICD-10-CM

## 2017-03-24 DIAGNOSIS — G43711 Chronic migraine without aura, intractable, with status migrainosus: Secondary | ICD-10-CM | POA: Insufficient documentation

## 2017-03-24 HISTORY — DX: Chronic migraine without aura, intractable, with status migrainosus: G43.711

## 2017-03-24 MED ORDER — KETOROLAC TROMETHAMINE 60 MG/2ML IM SOLN
60.0000 mg | Freq: Once | INTRAMUSCULAR | Status: AC
  Administered 2017-03-24: 60 mg via INTRAMUSCULAR

## 2017-03-24 MED ORDER — TOPIRAMATE 25 MG PO TABS: 100.0000 mg | ORAL_TABLET | Freq: Every day | ORAL | 6 refills | Status: DC
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
## 2017-03-24 NOTE — Patient Instructions (Signed)
Topiramate tablets What is this medicine? TOPIRAMATE (toe PYRE a mate) is used to treat seizures in adults or children with epilepsy. It is also used for the prevention of migraine headaches. This medicine may be used for other purposes; ask your health care provider or pharmacist if you have questions. COMMON BRAND NAME(S): Topamax, Topiragen What should I tell my health care provider before I take this medicine? They need to know if you have any of these conditions: -bleeding disorders -cirrhosis of the liver or liver disease -diarrhea -glaucoma -kidney stones or kidney disease -low blood counts, like low white cell, platelet, or red cell counts -lung disease like asthma, obstructive pulmonary disease, emphysema -metabolic acidosis -on a ketogenic diet -schedule for surgery or a procedure -suicidal thoughts, plans, or attempt; a previous suicide attempt by you or a family member -an unusual or allergic reaction to topiramate, other medicines, foods, dyes, or preservatives -pregnant or trying to get pregnant -breast-feeding How should I use this medicine? Take this medicine by mouth with a glass of water. Follow the directions on the prescription label. Do not crush or chew. You may take this medicine with meals. Take your medicine at regular intervals. Do not take it more often than directed. Talk to your pediatrician regarding the use of this medicine in children. Special care may be needed. While this drug may be prescribed for children as young as 2 years of age for selected conditions, precautions do apply. Overdosage: If you think you have taken too much of this medicine contact a poison control center or emergency room at once. NOTE: This medicine is only for you. Do not share this medicine with others. What if I miss a dose? If you miss a dose, take it as soon as you can. If your next dose is to be taken in less than 6 hours, then do not take the missed dose. Take the next dose at  your regular time. Do not take double or extra doses. What may interact with this medicine? Do not take this medicine with any of the following medications: -probenecid This medicine may also interact with the following medications: -acetazolamide -alcohol -amitriptyline -aspirin and aspirin-like medicines -birth control pills -certain medicines for depression -certain medicines for seizures -certain medicines that treat or prevent blood clots like warfarin, enoxaparin, dalteparin, apixaban, dabigatran, and rivaroxaban -digoxin -hydrochlorothiazide -lithium -medicines for pain, sleep, or muscle relaxation -metformin -methazolamide -NSAIDS, medicines for pain and inflammation, like ibuprofen or naproxen -pioglitazone -risperidone This list may not describe all possible interactions. Give your health care provider a list of all the medicines, herbs, non-prescription drugs, or dietary supplements you use. Also tell them if you smoke, drink alcohol, or use illegal drugs. Some items may interact with your medicine. What should I watch for while using this medicine? Visit your doctor or health care professional for regular checks on your progress. Do not stop taking this medicine suddenly. This increases the risk of seizures if you are using this medicine to control epilepsy. Wear a medical identification bracelet or chain to say you have epilepsy or seizures, and carry a card that lists all your medicines. This medicine can decrease sweating and increase your body temperature. Watch for signs of deceased sweating or fever, especially in children. Avoid extreme heat, hot baths, and saunas. Be careful about exercising, especially in hot weather. Contact your health care provider right away if you notice a fever or decrease in sweating. You should drink plenty of fluids while taking this medicine.   If you have had kidney stones in the past, this will help to reduce your chances of forming kidney  stones. If you have stomach pain, with nausea or vomiting and yellowing of your eyes or skin, call your doctor immediately. You may get drowsy, dizzy, or have blurred vision. Do not drive, use machinery, or do anything that needs mental alertness until you know how this medicine affects you. To reduce dizziness, do not sit or stand up quickly, especially if you are an older patient. Alcohol can increase drowsiness and dizziness. Avoid alcoholic drinks. If you notice blurred vision, eye pain, or other eye problems, seek medical attention at once for an eye exam. The use of this medicine may increase the chance of suicidal thoughts or actions. Pay special attention to how you are responding while on this medicine. Any worsening of mood, or thoughts of suicide or dying should be reported to your health care professional right away. This medicine may increase the chance of developing metabolic acidosis. If left untreated, this can cause kidney stones, bone disease, or slowed growth in children. Symptoms include breathing fast, fatigue, loss of appetite, irregular heartbeat, or loss of consciousness. Call your doctor immediately if you experience any of these side effects. Also, tell your doctor about any surgery you plan on having while taking this medicine since this may increase your risk for metabolic acidosis. Birth control pills may not work properly while you are taking this medicine. Talk to your doctor about using an extra method of birth control. Women who become pregnant while using this medicine may enroll in the [place] American Antiepileptic Drug Pregnancy Registry by calling 1-888-233-2334. This registry collects information about the safety of antiepileptic drug use during pregnancy. What side effects may I notice from receiving this medicine? Side effects that you should report to your doctor or health care professional as soon as possible: -allergic reactions like skin rash, itching or hives,  swelling of the face, lips, or tongue -decreased sweating and/or rise in body temperature -depression -difficulty breathing, fast or irregular breathing patterns -difficulty speaking -difficulty walking or controlling muscle movements -hearing impairment -redness, blistering, peeling or loosening of the skin, including inside the mouth -tingling, pain or numbness in the hands or feet -unusual bleeding or bruising -unusually weak or tired -worsening of mood, thoughts or actions of suicide or dying Side effects that usually do not require medical attention (report to your doctor or health care professional if they continue or are bothersome): -altered taste -back pain, joint or muscle aches and pains -diarrhea, or constipation -headache -loss of appetite -nausea -stomach upset, indigestion -tremors This list may not describe all possible side effects. Call your doctor for medical advice about side effects. You may report side effects to FDA at 1-800-FDA-1088. Where should I keep my medicine? Keep out of the reach of children. Store at room temperature between 15 and 30 degrees C (59 and 86 degrees F) in a tightly closed container. Protect from moisture. Throw away any unused medicine after the expiration date. NOTE: This sheet is a summary. It may not cover all possible information. If you have questions about this medicine, talk to your doctor, pharmacist, or health care provider.  2018 Elsevier/Gold Standard (2013-06-21 23:17:57)  

## 2017-03-24 NOTE — Addendum Note (Signed)
Addended by: Guy Begin on: 03/24/2017 02:28 PM   Modules accepted: Orders

## 2017-03-27 ENCOUNTER — Encounter: Payer: Self-pay | Admitting: Neurology

## 2017-03-27 ENCOUNTER — Telehealth: Payer: Self-pay | Admitting: Neurology

## 2017-03-27 NOTE — Telephone Encounter (Signed)
Sandy, just saw this phon emessage. please call patient ASAP in the morning and offer her nerve blocks or migraine cocktail in the office and let me know what happens thanks

## 2017-03-27 NOTE — Telephone Encounter (Signed)
Spoke to pt.  Upset we have not spoken to her since this am, she sent email.  She has been having migraine for the last 2 days.  Not taking advil and states she was told she would be able to get something same day she called.  Having dizziness, nausea, is frustrated, feels like her Bp is going down, P 110.  Taking topamax  po qhs. Is in constant pain.  Please call or email.

## 2017-03-27 NOTE — Telephone Encounter (Signed)
Pt states that her head is spinning, her blood pressure is droping and she is very much in need of something being called in for her 2 day head ache, please call

## 2017-03-28 ENCOUNTER — Other Ambulatory Visit: Payer: Self-pay | Admitting: Neurology

## 2017-03-28 DIAGNOSIS — G43111 Migraine with aura, intractable, with status migrainosus: Secondary | ICD-10-CM

## 2017-03-28 MED ORDER — PROCHLORPERAZINE MALEATE 5 MG PO TABS: 5.0000 mg | ORAL_TABLET | Freq: Four times a day (QID) | ORAL | 2 refills | Status: DC | PRN

## 2017-03-28 MED ORDER — TIZANIDINE HCL 4 MG PO TABS: 4.0000 mg | ORAL_TABLET | Freq: Four times a day (QID) | ORAL | 0 refills | Status: DC | PRN

## 2017-03-28 MED ORDER — METHYLPREDNISOLONE 4 MG PO TBPK
ORAL_TABLET | ORAL | 1 refills | Status: DC
Start: 2017-03-28 — End: 2018-07-22

## 2017-03-28 NOTE — Telephone Encounter (Signed)
I called pt and offered her appt for this am 1000 for trigger point injections or infusion.  She states she is not coming in for any injections or infusion. She states she told Dr. Lucia Gaskins how she felt about injections.  She would email Dr. Lucia Gaskins.  She is on day 3 of migraine.   She states this is a joke.

## 2017-03-28 NOTE — Telephone Encounter (Signed)
I called pt and she is ok to try these medications (compazine and steroids (she questioned if the serotonin in compazine).  She then said she could check with pharmacist.

## 2017-03-28 NOTE — Progress Notes (Signed)
Discussed side effects with patient. Watch for sedation and tizanidine. Compazine is an antiemetic may increase serotonin but this is not primarily a serotonin drug such as an SSRI however may want to discuss with psychiatry before taking if this may be an issue. The Medrol Dosepak discussed side effects as well. Stop for anything significant for any significant side effects. Compazine may also cause sedation be careful with the combination.

## 2017-03-28 NOTE — Telephone Encounter (Signed)
I called and spoke to pt and relayed that  Dr. Lucia Gaskins called in 3 medications for her (30 day supply) compazine (may have small amount of serotonin, but should be ok to take, may also check with pharmacist) too, tizanidine one tablet every 6 hours prn (this drug is for muscle spasms, used for headache/migraines, may cause drowsiness, anxiousness,  constipation, vomiting and steroids (common SE increased energy, increased appetite, insomnia).  She verbalized understanding. Called and let [employer] know no refills on these medications. Spoke to chelsea and she will make sure these are changed to no refills.

## 2017-03-28 NOTE — Telephone Encounter (Signed)
Andrey Campanile, I have emailed her. She can come in this morning. We can call her in some steroids and compazine as well. Please document if patient is unprofessional to you when you talk to her.  thanks

## 2017-04-01 ENCOUNTER — Encounter: Payer: Self-pay | Admitting: Neurology

## 2017-04-17 ENCOUNTER — Telehealth: Payer: Self-pay | Admitting: *Deleted

## 2017-04-17 NOTE — Telephone Encounter (Signed)
Return certified letter, to [facility]. Mailed new letter on 04/17/17

## 2017-05-07 ENCOUNTER — Ambulatory Visit: Payer: Medicaid Other | Admitting: Neurology

## 2017-06-02 ENCOUNTER — Ambulatory Visit: Payer: Medicaid Other | Admitting: Podiatry

## 2017-07-01 HISTORY — PX: APPENDECTOMY: SHX54

## 2017-07-19 ENCOUNTER — Emergency Department (HOSPITAL_COMMUNITY)
Admission: EM | Admit: 2017-07-19 | Discharge: 2017-07-19 | Disposition: A | Discharge status: Home / Self Care | Payer: Medicaid Other | Attending: Emergency Medicine | Admitting: Emergency Medicine

## 2017-07-19 ENCOUNTER — Encounter (HOSPITAL_COMMUNITY): Payer: Self-pay | Admitting: Cardiology

## 2017-07-19 DIAGNOSIS — F17210 Nicotine dependence, cigarettes, uncomplicated: Secondary | ICD-10-CM | POA: Insufficient documentation

## 2017-07-19 DIAGNOSIS — Z79899 Other long term (current) drug therapy: Secondary | ICD-10-CM | POA: Diagnosis not present

## 2017-07-19 DIAGNOSIS — L739 Follicular disorder, unspecified: Secondary | ICD-10-CM | POA: Insufficient documentation

## 2017-07-19 DIAGNOSIS — L989 Disorder of the skin and subcutaneous tissue, unspecified: Secondary | ICD-10-CM | POA: Diagnosis present

## 2017-07-19 MED ORDER — MUPIROCIN CALCIUM 2 % NA OINT: TOPICAL_OINTMENT | NASAL | 0 refills | Status: DC

## 2017-07-19 NOTE — ED Triage Notes (Signed)
Abscess to left groin.

## 2017-07-19 NOTE — ED Provider Notes (Signed)
[facility] EMERGENCY DEPARTMENT Provider Note   CSN: [id number] Arrival date & time: 07/19/17  1504     History   Chief Complaint Chief Complaint  Patient presents with  . Abscess    HPI [name] is a 32 y.o. female.  HPI  [name] is a 32 y.o. female who presents to the Emergency Department requesting evaluation of a painful "bump" to her left vaginal area.  She noticed a dark colored area 2-3 days ago that has gradually became sore to touch.  She states the area began draining yellow to bloody pus this morning.  She denies fever, abdominal pain, nausea and vomiting, vaginal discharge or bleeding.     Past Medical History:  Diagnosis Date  . Anxiety   . 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 Bipolar disorder (HCC)   . Cervicalgia   . Childhood asthma    seasonal with allergies  . Complication of anesthesia    hard to wake up post-op  . Depression   . Family history of adverse reaction to anesthesia    pt's father has hx. of being hard to wake up post-op  . Headache   . History of Chiari malformation   . History of MRSA infection    axilla  . 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 Panic attacks   . PTSD (post-traumatic stress disorder)   . Shoulder dislocation 06/2015   right    Patient Active Problem List   Diagnosis Date Noted  . 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 Acute appendicitis   . 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 Endometriosis 09/16/2012  . CERVICALGIA 09/08/2007  . CERVICAL SPASM 09/08/2007    Past Surgical History:  Procedure Laterality Date  . BRAIN SURGERY    . CESAREAN SECTION  09/05/2008  . CYSTOSCOPY N/A 09/15/2012   Procedure: CYSTOSCOPY;  Surgeon: Leslie AndreaJames E Tomblin II, MD;  Location: [facility];  Service: Gynecology;  Laterality: N/A;  . DILATION AND EVACUATION  04/15/2007  . HYSTEROSCOPY W/D&C  03/31/2012   Procedure: DILATATION AND CURETTAGE /HYSTEROSCOPY;  Surgeon: Leslie AndreaJames E Tomblin II, MD;  Location: [facility];  Service: Gynecology;  Laterality: N/A;  . LAPAROSCOPIC APPENDECTOMY N/A 10/29/2016   Procedure: APPENDECTOMY LAPAROSCOPIC;  Surgeon: Franky MachoJenkins, Mark, MD;  Location: [facility];  Service: General;  Laterality: N/A;  . LAPAROSCOPIC ASSISTED VAGINAL HYSTERECTOMY N/A 09/15/2012   Procedure: LAPAROSCOPIC ASSISTED VAGINAL HYSTERECTOMY;  Surgeon: Leslie AndreaJames E Tomblin II, MD;  Location: [facility];  Service: Gynecology;  Laterality: N/A;  . LAPAROSCOPY  03/31/2012   Procedure: LAPAROSCOPY OPERATIVE;  Surgeon: Leslie AndreaJames E Tomblin II, MD;  Location: [facility];  Service: Gynecology;  Laterality: N/A;  with Biopsies of Bilateral Fallopian Tubes; Fulguration of Endometriosis  . SHOULDER ARTHROSCOPY WITH CAPSULORRHAPHY Right 06/28/2015   Procedure: RIGHT SHOULDER ARTHROSCOPY WITH CAPSULORRHAPHY;  Surgeon: Frederico Hammananiel Caffrey, MD;  Location:  SURGERY CENTER;  Service: Orthopedics;  Laterality: Right;  . SUBOCCIPITAL CRANIECTOMY CERVICAL LAMINECTOMY N/A 08/08/2016   Procedure: SUBOCCIPITAL CRANIECTOMY CERVICAL LAMINECTOMY NUMBER DURAPLASTY;  Surgeon: Tressie StalkerJeffrey Jenkins, MD;  [facility];  Service: Neurosurgery;  Laterality: N/A;  suboccipital  . ULNAR NERVE REPAIR Right 05/2015    OB History    Gravida Para Term Preterm AB Living   2 1 1   1 1    SAB TAB Ectopic Multiple Live Births   1  Home Medications    Prior to Admission medications   Medication Sig Start Date End Date Taking? Authorizing Provider  Cariprazine HCl (VRAYLAR) 1.5 MG CAPS Take 1 capsule by mouth daily.    [provider]  linaclotide (LINZESS) 145 MCG CAPS capsule Take 145 mcg by mouth daily as needed (constipation).    [provider]  methylPREDNISolone (MEDROL DOSEPAK) 4 MG TBPK tablet follow package directions 03/28/17   Anson Fret, MD  nicotine (NICODERM CQ - DOSED IN MG/24 HOURS) 14 mg/24hr patch Place 14 mg  onto the skin daily as needed (smoking cessation).    [provider]  prochlorperazine (COMPAZINE) 5 MG tablet Take 1 tablet (5 mg total) by mouth every 6 (six) hours as needed for nausea or vomiting. May cause sedation. 03/28/17   Anson Fret, MD  tiZANidine (ZANAFLEX) 4 MG tablet Take 1 tablet (4 mg total) by mouth every 6 (six) hours as needed for muscle spasms. May also take for headache/migraine.Watch for sedation. 03/28/17   Anson Fret, MD  topiramate (TOPAMAX) 25 MG tablet Take 4 tablets (100 mg total) by mouth at bedtime. 03/24/17   Anson Fret, MD    Family History Family History  Problem Relation Age of Onset  . Anesthesia problems Father        hard to wake up post-op  . Stroke Father     Social History Social History   Tobacco Use  . Smoking status: Current Every Day Smoker    Packs/day: 1.00    Years: 10.00    Pack years: 10.00    Types: Cigarettes  . Smokeless tobacco: Never Used  Substance Use Topics  . Alcohol use: No    Comment: social  . Drug use: No     Allergies   Dilaudid [hydromorphone hcl]; Adhesive [tape]; and Morphine and related   Review of Systems Review of Systems  Constitutional: Negative for chills and fever.  Cardiovascular: Negative for chest pain.  Gastrointestinal: Negative for abdominal pain, nausea and vomiting.  Genitourinary: Positive for vaginal pain. Negative for decreased urine volume, dysuria, hematuria, pelvic pain, vaginal bleeding and vaginal discharge.  Musculoskeletal: Negative for arthralgias and joint swelling.  Skin: Positive for color change.       Abscess   Hematological: Negative for adenopathy.  All other systems reviewed and are negative.    Physical Exam Updated Vital Signs BP 103/62   Pulse 79   Temp 98.1 F (36.7 C) (Oral)   Resp 14   Ht 5\' 7"  (1.702 m)   Wt 97.5 kg (215 lb)   LMP 08/18/2012   SpO2 97%   BMI 33.67 kg/m   Physical Exam  Constitutional: She is oriented to  person, place, and time. She appears well-developed and well-nourished. No distress.  HENT:  Head: Normocephalic and atraumatic.  Mouth/Throat: Oropharynx is clear and moist.  Neck: Normal range of motion. Neck supple.  Cardiovascular: Normal rate and regular rhythm.  Pulmonary/Chest: Effort normal and breath sounds normal. No respiratory distress.  Abdominal: Soft. She exhibits no distension and no mass. There is no tenderness. There is no guarding.  Musculoskeletal: Normal range of motion.  Neurological: She is alert and oriented to person, place, and time. She exhibits normal muscle tone. Coordination normal.  Skin: Skin is warm and dry. Capillary refill takes less than 2 seconds. No rash noted. There is erythema.  Single < 1 cm open pustule to the left vulva with small amt of surrounding erythema.  No fluctuance or induration.    Psychiatric: She has a normal mood and affect.  Nursing note and vitals reviewed.    ED Treatments / Results  Labs (all labs ordered are listed, but only abnormal results are displayed) Labs Reviewed - No data to display  EKG  EKG Interpretation None       Radiology No results found.  Procedures Procedures (including critical care time)  Medications Ordered in ED Medications - No data to display   Initial Impression / Assessment and Plan / ED Course  I have reviewed the triage vital signs and the nursing notes.  Pertinent labs & imaging results that were available during my care of the patient were reviewed by me and considered in my medical decision making (see chart for details).    Patient is well-appearing.  Afebrile.  No abdominal tenderness or guarding.  Single small, focal pustule of the left upper vulva.  Likely folliculitis.  Small amount of surrounding erythema, no indication for I&D at present.  Patient is currently taking antibiotics for dental infection.  She agrees to treatment plan which consists of warm compresses and  prescription for Bactroban ointment.  Return precautions discussed.  She appears safe for discharge home.  Final Clinical Impressions(s) / ED Diagnoses   Final diagnoses:  Folliculitis    ED Discharge Orders    None       Pauline Aus, Cordelia Poche 07/19/17 1734    Eber Hong, MD 07/20/17 1459

## 2017-07-19 NOTE — Discharge Instructions (Signed)
Frequent, warm wet compresses to the affected area.  Keep the area bandaged while draining.  Return to the ER for any worsening symptoms

## 2017-09-22 ENCOUNTER — Encounter (HOSPITAL_COMMUNITY): Payer: Self-pay

## 2017-09-22 ENCOUNTER — Other Ambulatory Visit: Payer: Self-pay

## 2017-09-22 ENCOUNTER — Emergency Department (HOSPITAL_COMMUNITY)
Admission: EM | Admit: 2017-09-22 | Discharge: 2017-09-22 | Disposition: A | Discharge status: Home / Self Care | Payer: Medicaid Other | Attending: Emergency Medicine | Admitting: Emergency Medicine

## 2017-09-22 DIAGNOSIS — Z79899 Other long term (current) drug therapy: Secondary | ICD-10-CM | POA: Insufficient documentation

## 2017-09-22 DIAGNOSIS — Z87891 Personal history of nicotine dependence: Secondary | ICD-10-CM | POA: Insufficient documentation

## 2017-09-22 DIAGNOSIS — J45909 Unspecified asthma, uncomplicated: Secondary | ICD-10-CM | POA: Diagnosis not present

## 2017-09-22 DIAGNOSIS — L739 Follicular disorder, unspecified: Secondary | ICD-10-CM

## 2017-09-22 MED ORDER — SULFAMETHOXAZOLE-TRIMETHOPRIM 800-160 MG PO TABS: 1.0000 | ORAL_TABLET | Freq: Once | ORAL | Status: DC

## 2017-09-22 MED ORDER — SULFAMETHOXAZOLE-TRIMETHOPRIM 800-160 MG PO TABS: 1.0000 | ORAL_TABLET | Freq: Two times a day (BID) | ORAL | 0 refills | Status: AC

## 2017-09-22 NOTE — Discharge Instructions (Signed)
Soak area 20 minutes 4 times a day °

## 2017-09-22 NOTE — ED Provider Notes (Signed)
[name]ENCY [facility] Provider Note   CSN: [id number] Arrival date & time: 09/22/17  1951     History   Chief Complaint Chief Complaint  Patient presents with  . Wound Check    HPI [name] is a 32 y.o. female.  The history is provided by the patient. No language interpreter was used.  Wound Check  This is a new problem. The current episode started 6 to 12 hours ago. The problem occurs constantly. The problem has been gradually worsening. Nothing aggravates the symptoms. Nothing relieves the symptoms. She has tried nothing for the symptoms. The treatment provided no relief.  Pt had surgery for a chiari malformation a year ago.  Pt complains of a bump over incision that is painful.   Past Medical History:  Diagnosis Date  . Anxiety   . 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 Bipolar disorder (HCC)   . Cervicalgia   . Childhood asthma    seasonal with allergies  . Complication of anesthesia    hard to wake up post-op  . Depression   . Family history of adverse reaction to anesthesia    pt's father has hx. of being hard to wake up post-op  . Headache   . History of Chiari malformation   . History of MRSA infection    axilla  . 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 Panic attacks   . PTSD (post-traumatic stress disorder)   . Shoulder dislocation 06/2015   right    Patient Active Problem List   Diagnosis Date Noted  . 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 Acute appendicitis   . 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 Endometriosis 09/16/2012  . CERVICALGIA 09/08/2007  . CERVICAL SPASM 09/08/2007    Past Surgical History:  Procedure Laterality Date  . BRAIN SURGERY    . CESAREAN SECTION  09/05/2008  . CYSTOSCOPY N/A 09/15/2012   Procedure: CYSTOSCOPY;  Surgeon: Laray Rivkin AndreaJames E Tomblin II, MD;  Location: [facility];  Service: Gynecology;  Laterality: N/A;  .  DILATION AND EVACUATION  04/15/2007  . HYSTEROSCOPY W/D&C  03/31/2012   Procedure: DILATATION AND CURETTAGE /HYSTEROSCOPY;  Surgeon: Roi Jafari AndreaJames E Tomblin II, MD;  Location: [facility];  Service: Gynecology;  Laterality: N/A;  . LAPAROSCOPIC APPENDECTOMY N/A 10/29/2016   Procedure: APPENDECTOMY LAPAROSCOPIC;  Surgeon: Franky MachoJenkins, Mark, MD;  Location: [facility];  Service: General;  Laterality: N/A;  . LAPAROSCOPIC ASSISTED VAGINAL HYSTERECTOMY N/A 09/15/2012   Procedure: LAPAROSCOPIC ASSISTED VAGINAL HYSTERECTOMY;  Surgeon: Hermann Dottavio AndreaJames E Tomblin II, MD;  Location: [facility];  Service: Gynecology;  Laterality: N/A;  . LAPAROSCOPY  03/31/2012   Procedure: LAPAROSCOPY OPERATIVE;  Surgeon: Rida Loudin AndreaJames E Tomblin II, MD;  Location: [facility];  Service: Gynecology;  Laterality: N/A;  with Biopsies of Bilateral Fallopian Tubes; Fulguration of Endometriosis  . SHOULDER ARTHROSCOPY WITH CAPSULORRHAPHY Right 06/28/2015   Procedure: RIGHT SHOULDER ARTHROSCOPY WITH CAPSULORRHAPHY;  Surgeon: Frederico Hammananiel Caffrey, MD;  Location: [facility];  Service: Orthopedics;  Laterality: Right;  . SUBOCCIPITAL CRANIECTOMY CERVICAL LAMINECTOMY N/A 08/08/2016   Procedure: SUBOCCIPITAL CRANIECTOMY CERVICAL LAMINECTOMY NUMBER DURAPLASTY;  Surgeon: Tressie StalkerJeffrey Jenkins, MD;  [facility];  Service: Neurosurgery;  Laterality: N/A;  suboccipital  . ULNAR NERVE REPAIR Right 05/2015     OB History    Gravida  2   Para  1   Term  1   Preterm      AB  1  Living  1     SAB  1   TAB      Ectopic      Multiple      Live Births               Home Medications    Prior to Admission medications   Medication Sig Start Date End Date Taking? Authorizing Provider  Cariprazine HCl (VRAYLAR) 1.5 MG CAPS Take 1 capsule by mouth daily.    [provider]  linaclotide (LINZESS) 145 MCG CAPS capsule Take 145 mcg by mouth daily as needed (constipation).    [provider]  methylPREDNISolone (MEDROL DOSEPAK) 4 MG TBPK tablet follow  package directions 03/28/17   Anson Fret, MD  mupirocin nasal ointment (BACTROBAN) 2 % Apply to affected area 3 times daily for 10 days 07/19/17   Triplett, Tammy, PA-C  nicotine (NICODERM CQ - DOSED IN MG/24 HOURS) 14 mg/24hr patch Place 14 mg onto the skin daily as needed (smoking cessation).    [provider]  prochlorperazine (COMPAZINE) 5 MG tablet Take 1 tablet (5 mg total) by mouth every 6 (six) hours as needed for nausea or vomiting. May cause sedation. 03/28/17   Anson Fret, MD  sulfamethoxazole-trimethoprim (BACTRIM DS,SEPTRA DS) 800-160 MG tablet Take 1 tablet by mouth 2 (two) times daily for 7 days. 09/22/17 09/29/17  Elson Areas, PA-C  tiZANidine (ZANAFLEX) 4 MG tablet Take 1 tablet (4 mg total) by mouth every 6 (six) hours as needed for muscle spasms. May also take for headache/migraine.Watch for sedation. 03/28/17   Anson Fret, MD  topiramate (TOPAMAX) 25 MG tablet Take 4 tablets (100 mg total) by mouth at bedtime. 03/24/17   Anson Fret, MD    Family History Family History  Problem Relation Age of Onset  . Anesthesia problems Father        hard to wake up post-op  . Stroke Father     Social History Social History   Tobacco Use  . Smoking status: Former Smoker    Packs/day: 1.00    Years: 10.00    Pack years: 10.00    Types: Cigarettes  . Smokeless tobacco: Never Used  . Tobacco comment: quit 2 months ago  Substance Use Topics  . Alcohol use: No    Comment: social  . Drug use: No     Allergies   Dilaudid [hydromorphone hcl]; Adhesive [tape]; and Morphine and related   Review of Systems Review of Systems  All other systems reviewed and are negative.    Physical Exam Updated Vital Signs BP 99/81 (BP Location: Right Arm)   Pulse 80   Temp 98.9 F (37.2 C)   Resp 16   Ht 5\' 7"  (1.702 m)   Wt 104.3 kg (230 lb)   LMP 08/18/2012   SpO2 100%   BMI 36.02 kg/m   Physical Exam  Constitutional: She appears well-developed and  well-nourished.  HENT:  Head: Normocephalic and atraumatic.  Eyes: Pupils are equal, round, and reactive to light.  Neck: Normal range of motion.  Well healed incision,  Pt has shaved back of neck,  Multiple pimples along hair shaft,  Tender pimple incision line,   Nursing note and vitals reviewed.    ED Treatments / Results  Labs (all labs ordered are listed, but only abnormal results are displayed) Labs Reviewed - No data to display  EKG None  Radiology No results found.  Procedures Procedures (including critical care time)  Medications Ordered in ED Medications  sulfamethoxazole-trimethoprim (BACTRIM DS,SEPTRA DS) 800-160 MG per tablet 1 tablet (has no administration in time range)   MDM  Superficial infection, no evidence of deep infection.   Rx for Bactrim Warm Compresses An After Visit Summary was printed and given to the patient.   Initial Impression / Assessment and Plan / ED Course  I have reviewed the triage vital signs and the nursing notes.  Pertinent labs & imaging results that were available during my care of the patient were reviewed by me and considered in my medical decision making (see chart for details).       Final Clinical Impressions(s) / ED Diagnoses   Final diagnoses:  Folliculitis    ED Discharge Orders        Ordered    sulfamethoxazole-trimethoprim (BACTRIM DS,SEPTRA DS) 800-160 MG tablet  2 times daily     09/22/17 2215       Osie Cheeks 09/22/17 2221    Donnetta Hutching, MD 09/23/17 [phone]

## 2017-09-22 NOTE — ED Triage Notes (Signed)
Pt reports having surgery last February for chiari malformation and woke up this morning with a "bump" to scar where incision from surgical procedure. Pt reports bump is very painful and she has not had any problems from the surgery until now. Pt does still shave occipital region of head where scar is located.

## 2017-11-08 ENCOUNTER — Emergency Department (HOSPITAL_COMMUNITY)
Admission: EM | Admit: 2017-11-08 | Discharge: 2017-11-09 | Disposition: A | Discharge status: Home / Self Care | Payer: Medicaid Other | Attending: Emergency Medicine | Admitting: Emergency Medicine

## 2017-11-08 ENCOUNTER — Encounter (HOSPITAL_COMMUNITY): Payer: Self-pay | Admitting: Emergency Medicine

## 2017-11-08 ENCOUNTER — Other Ambulatory Visit: Payer: Self-pay

## 2017-11-08 DIAGNOSIS — L299 Pruritus, unspecified: Secondary | ICD-10-CM | POA: Diagnosis not present

## 2017-11-08 DIAGNOSIS — Z87891 Personal history of nicotine dependence: Secondary | ICD-10-CM | POA: Diagnosis not present

## 2017-11-08 DIAGNOSIS — Z79899 Other long term (current) drug therapy: Secondary | ICD-10-CM | POA: Insufficient documentation

## 2017-11-08 DIAGNOSIS — J029 Acute pharyngitis, unspecified: Secondary | ICD-10-CM | POA: Diagnosis not present

## 2017-11-08 NOTE — ED Triage Notes (Signed)
Pt C/O her throat burning and hurting worse when she swallows.

## 2017-11-09 LAB — GROUP A STREP BY PCR: Group A Strep by PCR: NOT DETECTED

## 2017-11-09 MED ORDER — ACETAMINOPHEN 325 MG PO TABS
650.0000 mg | ORAL_TABLET | Freq: Once | ORAL | Status: AC
  Administered 2017-11-09: 650 mg via ORAL
  Filled 2017-11-09: qty 2

## 2017-11-09 MED ORDER — MENTHOL 3 MG MT LOZG
1.0000 | LOZENGE | OROMUCOSAL | Status: DC | PRN
  Filled 2017-11-09: qty 9

## 2017-11-09 NOTE — Discharge Instructions (Signed)
Return if any problems.

## 2017-11-09 NOTE — ED Provider Notes (Signed)
[facility] EMERGENCY DEPARTMENT Provider Note   CSN: [id number] Arrival date & time: 11/08/17  2317     History   Chief Complaint Chief Complaint  Patient presents with  . Sore Throat    HPI [name] is a 32 y.o. female.  The history is provided by the patient. No language interpreter was used.  Sore Throat  This is a new problem. The current episode started 1 to 2 hours ago. The problem occurs constantly. The problem has not changed since onset.Nothing aggravates the symptoms. Nothing relieves the symptoms. She has tried nothing for the symptoms.  Pt complains of throat and mouth feeling itchy.  Pt denies swelling, no shortness of breath, no rash.    Past Medical History:  Diagnosis Date  . Anxiety   . 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 Bipolar disorder (HCC)   . Cervicalgia   . Childhood asthma    seasonal with allergies  . Complication of anesthesia    hard to wake up post-op  . Depression   . Family history of adverse reaction to anesthesia    pt's father has hx. of being hard to wake up post-op  . Headache   . History of Chiari malformation   . History of MRSA infection    axilla  . 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 Panic attacks   . PTSD (post-traumatic stress disorder)   . Shoulder dislocation 06/2015   right    Patient Active Problem List   Diagnosis Date Noted  . 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 Acute appendicitis   . 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 Endometriosis 09/16/2012  . CERVICALGIA 09/08/2007  . CERVICAL SPASM 09/08/2007    Past Surgical History:  Procedure Laterality Date  . BRAIN SURGERY    . CESAREAN SECTION  09/05/2008  . CYSTOSCOPY N/A 09/15/2012   Procedure: CYSTOSCOPY;  Surgeon: Leslie Andrea, MD;  Location: [facility];  Service: Gynecology;  Laterality: N/A;  . DILATION AND EVACUATION  04/15/2007  .  HYSTEROSCOPY W/D&C  03/31/2012   Procedure: DILATATION AND CURETTAGE /HYSTEROSCOPY;  Surgeon: Leslie Andrea, MD;  Location: [facility];  Service: Gynecology;  Laterality: N/A;  . LAPAROSCOPIC APPENDECTOMY N/A 10/29/2016   Procedure: APPENDECTOMY LAPAROSCOPIC;  Surgeon: Franky Macho, MD;  Location: [facility];  Service: General;  Laterality: N/A;  . LAPAROSCOPIC ASSISTED VAGINAL HYSTERECTOMY N/A 09/15/2012   Procedure: LAPAROSCOPIC ASSISTED VAGINAL HYSTERECTOMY;  Surgeon: Leslie Andrea, MD;  Location: [facility];  Service: Gynecology;  Laterality: N/A;  . LAPAROSCOPY  03/31/2012   Procedure: LAPAROSCOPY OPERATIVE;  Surgeon: Leslie Andrea, MD;  Location: [facility];  Service: Gynecology;  Laterality: N/A;  with Biopsies of Bilateral Fallopian Tubes; Fulguration of Endometriosis  . SHOULDER ARTHROSCOPY WITH CAPSULORRHAPHY Right 06/28/2015   Procedure: RIGHT SHOULDER ARTHROSCOPY WITH CAPSULORRHAPHY;  Surgeon: Frederico Hamman, MD;  Location: [facility];  Service: Orthopedics;  Laterality: Right;  . SUBOCCIPITAL CRANIECTOMY CERVICAL LAMINECTOMY N/A 08/08/2016   Procedure: SUBOCCIPITAL CRANIECTOMY CERVICAL LAMINECTOMY NUMBER DURAPLASTY;  Surgeon: Tressie Stalker, MD;  Location: [facility];  Service: Neurosurgery;  Laterality: N/A;  suboccipital  . ULNAR NERVE REPAIR Right 05/2015     OB History    Gravida  2   Para  1   Term  1   Preterm      AB  1   Living  1  SAB  1   TAB      Ectopic      Multiple      Live Births               Home Medications    Prior to Admission medications   Medication Sig Start Date End Date Taking? Authorizing Provider  Cariprazine HCl (VRAYLAR) 1.5 MG CAPS Take 1 capsule by mouth daily.    [provider]  linaclotide (LINZESS) 145 MCG CAPS capsule Take 145 mcg by mouth daily as needed (constipation).    [provider]  methylPREDNISolone (MEDROL DOSEPAK) 4 MG TBPK tablet follow package directions 03/28/17   Anson Fret, MD  mupirocin nasal ointment (BACTROBAN) 2 % Apply to affected area 3 times daily for 10 days 07/19/17   Triplett, Tammy, PA-C  nicotine (NICODERM CQ - DOSED IN MG/24 HOURS) 14 mg/24hr patch Place 14 mg onto the skin daily as needed (smoking cessation).    [provider]  prochlorperazine (COMPAZINE) 5 MG tablet Take 1 tablet (5 mg total) by mouth every 6 (six) hours as needed for nausea or vomiting. May cause sedation. 03/28/17   Anson Fret, MD  tiZANidine (ZANAFLEX) 4 MG tablet Take 1 tablet (4 mg total) by mouth every 6 (six) hours as needed for muscle spasms. May also take for headache/migraine.Watch for sedation. 03/28/17   Anson Fret, MD  topiramate (TOPAMAX) 25 MG tablet Take 4 tablets (100 mg total) by mouth at bedtime. 03/24/17   Anson Fret, MD    Family History Family History  Problem Relation Age of Onset  . Anesthesia problems Father        hard to wake up post-op  . Stroke Father     Social History Social History   Tobacco Use  . Smoking status: Former Smoker    Packs/day: 1.00    Years: 10.00    Pack years: 10.00    Types: Cigarettes  . Smokeless tobacco: Never Used  . Tobacco comment: quit 2 months ago  Substance Use Topics  . Alcohol use: No    Comment: social  . Drug use: No     Allergies   Dilaudid [hydromorphone hcl]; Adhesive [tape]; and Morphine and related   Review of Systems Review of Systems  All other systems reviewed and are negative.    Physical Exam Updated Vital Signs Wt 104.3 kg (230 lb)   LMP 08/18/2012   BMI 36.02 kg/m   Physical Exam  Constitutional: She is oriented to person, place, and time. She appears well-developed and well-nourished.  HENT:  Head: Normocephalic and atraumatic.  Mouth/Throat: Posterior oropharyngeal erythema present.  Slight erythema pharynx,  No swelling,    Eyes: Pupils are equal, round, and reactive to light. Conjunctivae and EOM are normal.  Neck: Normal range of motion.  Neck supple.  Pulmonary/Chest: Effort normal.  Abdominal: Soft.  Musculoskeletal: Normal range of motion.  Neurological: She is alert and oriented to person, place, and time.  Skin: Skin is warm and dry.  Psychiatric: She has a normal mood and affect.     ED Treatments / Results  Labs (all labs ordered are listed, but only abnormal results are displayed) Labs Reviewed  GROUP A STREP BY PCR    EKG None  Radiology No results found.  Procedures Procedures (including critical care time)  Medications Ordered in ED Medications  menthol-cetylpyridinium (CEPACOL) lozenge 3 mg (has no administration in time range)  acetaminophen (TYLENOL) tablet 650  mg (650 mg Oral Given 11/09/17 0045)     Initial Impression / Assessment and Plan / ED Course  I have reviewed the triage vital signs and the nursing notes.  Pertinent labs & imaging results that were available during my care of the patient were reviewed by me and considered in my medical decision making (see chart for details).     Pt observed x1.5 hours,  No rash, no further swelling,  Possible virus.  No signs of allergic reaction  Final Clinical Impressions(s) / ED Diagnoses   Final diagnoses:  Pharyngitis, unspecified etiology    ED Discharge Orders    None    An After Visit Summary was printed and given to the patient.    Elson Areas, [place] 11/09/17 0981    Mancel Bale, MD 11/09/17 [phone]

## 2018-06-13 ENCOUNTER — Encounter (HOSPITAL_COMMUNITY): Payer: Self-pay | Admitting: Emergency Medicine

## 2018-06-13 ENCOUNTER — Other Ambulatory Visit: Payer: Self-pay

## 2018-06-13 ENCOUNTER — Emergency Department (HOSPITAL_COMMUNITY)
Admission: EM | Admit: 2018-06-13 | Discharge: 2018-06-14 | Disposition: A | Discharge status: Home / Self Care | Payer: Medicaid Other | Attending: Emergency Medicine | Admitting: Emergency Medicine

## 2018-06-13 ENCOUNTER — Emergency Department (HOSPITAL_COMMUNITY): Payer: Medicaid Other

## 2018-06-13 DIAGNOSIS — Y999 Unspecified external cause status: Secondary | ICD-10-CM | POA: Diagnosis not present

## 2018-06-13 DIAGNOSIS — F17210 Nicotine dependence, cigarettes, uncomplicated: Secondary | ICD-10-CM | POA: Diagnosis not present

## 2018-06-13 DIAGNOSIS — S93401A Sprain of unspecified ligament of right ankle, initial encounter: Secondary | ICD-10-CM | POA: Diagnosis not present

## 2018-06-13 DIAGNOSIS — S99911A Unspecified injury of right ankle, initial encounter: Secondary | ICD-10-CM | POA: Diagnosis present

## 2018-06-13 DIAGNOSIS — X500XXA Overexertion from strenuous movement or load, initial encounter: Secondary | ICD-10-CM | POA: Insufficient documentation

## 2018-06-13 DIAGNOSIS — Y9389 Activity, other specified: Secondary | ICD-10-CM | POA: Insufficient documentation

## 2018-06-13 DIAGNOSIS — M25571 Pain in right ankle and joints of right foot: Secondary | ICD-10-CM

## 2018-06-13 DIAGNOSIS — Z79899 Other long term (current) drug therapy: Secondary | ICD-10-CM | POA: Diagnosis not present

## 2018-06-13 DIAGNOSIS — Y929 Unspecified place or not applicable: Secondary | ICD-10-CM | POA: Insufficient documentation

## 2018-06-13 MED ORDER — ACETAMINOPHEN 500 MG PO TABS
1000.0000 mg | ORAL_TABLET | Freq: Once | ORAL | Status: AC
  Administered 2018-06-14: 1000 mg via ORAL
  Filled 2018-06-13: qty 2

## 2018-06-13 MED ORDER — IBUPROFEN 800 MG PO TABS
800.0000 mg | ORAL_TABLET | Freq: Once | ORAL | Status: AC
  Administered 2018-06-14: 800 mg via ORAL
  Filled 2018-06-13: qty 1

## 2018-06-13 NOTE — ED Provider Notes (Signed)
[facility] EMERGENCY DEPARTMENT Provider Note   CSN: [id number] Arrival date & time: 06/13/18  2302     History   Chief Complaint Chief Complaint  Patient presents with  . Ankle Pain    HPI [name] is a 32 y.o. female.  Patient is a 32 year old female who presents to the emergency department with a complaint of right ankle pain.  The patient states that 6 days ago she was getting out of her SUV.  Her foot and ankle got caught in a pocketbook strap.  She injured the right ankle.  The patient states that she has had pain and swelling since that time.  She has been using ibuprofen and applying ice and elevating the right foot and ankle.  She says the swelling has improved some, but when she makes certain movements and when she walks that he pain seems to be more intense, particularly today she was doing more getting around than usual and getting in and out of her SUV caused a great deal of pain.  She has not been evaluated since this injury.  The patient denies any previous ankle injury.  Patient denies being on any anticoagulation medications, and there is been no previous operations or procedures involving the right lower extremity.  The history is provided by the patient.  Ankle Pain      Past Medical History:  Diagnosis Date  . Anxiety   . 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 Bipolar disorder (HCC)   . Cervicalgia   . Childhood asthma    seasonal with allergies  . Complication of anesthesia    hard to wake up post-op  . Depression   . Family history of adverse reaction to anesthesia    pt's father has hx. of being hard to wake up post-op  . Headache   . History of Chiari malformation   . History of MRSA infection    axilla  . 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 Panic attacks   . PTSD (post-traumatic stress disorder)   . Shoulder dislocation 06/2015   right    Patient Active Problem List   Diagnosis Date Noted  . 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 Acute appendicitis   . 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 Endometriosis 09/16/2012  . CERVICALGIA 09/08/2007  . CERVICAL SPASM 09/08/2007    Past Surgical History:  Procedure Laterality Date  . BRAIN SURGERY    . CESAREAN SECTION  09/05/2008  . CYSTOSCOPY N/A 09/15/2012   Procedure: CYSTOSCOPY;  Surgeon: Leslie Andrea, MD;  Location: [facility];  Service: Gynecology;  Laterality: N/A;  . DILATION AND EVACUATION  04/15/2007  . HYSTEROSCOPY W/D&C  03/31/2012   Procedure: DILATATION AND CURETTAGE /HYSTEROSCOPY;  Surgeon: Leslie Andrea, MD;  Location: [facility];  Service: Gynecology;  Laterality: N/A;  . LAPAROSCOPIC APPENDECTOMY N/A 10/29/2016   Procedure: APPENDECTOMY LAPAROSCOPIC;  Surgeon: Franky Macho, MD;  Location: [facility];  Service: General;  Laterality: N/A;  . LAPAROSCOPIC ASSISTED VAGINAL HYSTERECTOMY N/A 09/15/2012   Procedure: LAPAROSCOPIC ASSISTED VAGINAL HYSTERECTOMY;  Surgeon: Leslie Andrea, MD;  Location: [facility];  Service: Gynecology;  Laterality: N/A;  . LAPAROSCOPY  03/31/2012   Procedure: LAPAROSCOPY OPERATIVE;  Surgeon: Leslie Andrea, MD;  Location: [facility];  Service: Gynecology;  Laterality: N/A;  with Biopsies of Bilateral Fallopian Tubes; Fulguration of Endometriosis  . SHOULDER ARTHROSCOPY WITH CAPSULORRHAPHY Right  06/28/2015   Procedure: RIGHT SHOULDER ARTHROSCOPY WITH CAPSULORRHAPHY;  Surgeon: Frederico Hammananiel Caffrey, MD;  Location: [facility];  Service: Orthopedics;  Laterality: Right;  . SUBOCCIPITAL CRANIECTOMY CERVICAL LAMINECTOMY N/A 08/08/2016   Procedure: SUBOCCIPITAL CRANIECTOMY CERVICAL LAMINECTOMY NUMBER DURAPLASTY;  Surgeon: Tressie StalkerJeffrey Jenkins, MD;  [facility];  Service: Neurosurgery;  Laterality: N/A;  suboccipital  . ULNAR NERVE REPAIR Right 05/2015     OB History    Gravida  2   Para  1   Term  1   Preterm      AB  1    Living  1     SAB  1   TAB      Ectopic      Multiple      Live Births               Home Medications    Prior to Admission medications   Medication Sig Start Date End Date Taking? Authorizing Provider  Cariprazine HCl (VRAYLAR) 1.5 MG CAPS Take 1 capsule by mouth daily.    [provider]  linaclotide (LINZESS) 145 MCG CAPS capsule Take 145 mcg by mouth daily as needed (constipation).    [provider]  methylPREDNISolone (MEDROL DOSEPAK) 4 MG TBPK tablet follow package directions 03/28/17   Anson FretAhern, Antonia B, MD  mupirocin nasal ointment (BACTROBAN) 2 % Apply to affected area 3 times daily for 10 days 07/19/17   Triplett, Tammy, PA-C  nicotine (NICODERM CQ - DOSED IN MG/24 HOURS) 14 mg/24hr patch Place 14 mg onto the skin daily as needed (smoking cessation).    [provider]  prochlorperazine (COMPAZINE) 5 MG tablet Take 1 tablet (5 mg total) by mouth every 6 (six) hours as needed for nausea or vomiting. May cause sedation. 03/28/17   Anson FretAhern, Antonia B, MD  tiZANidine (ZANAFLEX) 4 MG tablet Take 1 tablet (4 mg total) by mouth every 6 (six) hours as needed for muscle spasms. May also take for headache/migraine.Watch for sedation. 03/28/17   Anson FretAhern, Antonia B, MD  topiramate (TOPAMAX) 25 MG tablet Take 4 tablets (100 mg total) by mouth at bedtime. 03/24/17   Anson FretAhern, Antonia B, MD    Family History Family History  Problem Relation Age of Onset  . Anesthesia problems Father        hard to wake up post-op  . Stroke Father     Social History Social History   Tobacco Use  . Smoking status: Current Every Day Smoker    Packs/day: 1.00    Years: 10.00    Pack years: 10.00    Types: Cigarettes  . Smokeless tobacco: Never Used  . Tobacco comment: quit 2 months ago  Substance Use Topics  . Alcohol use: No    Comment: social  . Drug use: No     Allergies   Dilaudid [hydromorphone hcl]; Adhesive [tape]; and Morphine and related   Review of  Systems Review of Systems  Constitutional: Negative for activity change.       All ROS Neg except as noted in HPI  HENT: Negative for nosebleeds.   Eyes: Negative for photophobia and discharge.  Respiratory: Negative for cough, shortness of breath and wheezing.   Cardiovascular: Negative for chest pain and palpitations.  Gastrointestinal: Negative for abdominal pain and blood in stool.  Genitourinary: Negative for dysuria, frequency and hematuria.  Musculoskeletal: Negative for arthralgias, back pain and neck pain.       Ankle pain  Skin: Negative.  Neurological: Negative for dizziness, seizures and speech difficulty.  Psychiatric/Behavioral: Negative for confusion and hallucinations.     Physical Exam Updated Vital Signs BP 127/79 (BP Location: Right Arm)   Pulse 88   Temp 97.9 F (36.6 C) (Temporal)   Resp 18   Ht 5\' 7"  (1.702 m)   Wt 107.5 kg   LMP 08/18/2012   SpO2 100%   BMI 37.12 kg/m   Physical Exam Vitals signs and nursing note reviewed.  Constitutional:      Appearance: She is well-developed. She is not toxic-appearing.  HENT:     Head: Normocephalic.     Right Ear: Tympanic membrane and external ear normal.     Left Ear: Tympanic membrane and external ear normal.  Eyes:     General: Lids are normal.     Pupils: Pupils are equal, round, and reactive to light.  Neck:     Musculoskeletal: Normal range of motion and neck supple.     Vascular: No carotid bruit.  Cardiovascular:     Rate and Rhythm: Normal rate and regular rhythm.     Pulses: Normal pulses.     Heart sounds: Normal heart sounds.  Pulmonary:     Effort: No respiratory distress.     Breath sounds: Normal breath sounds.  Abdominal:     General: Bowel sounds are normal.     Palpations: Abdomen is soft.     Tenderness: There is no abdominal tenderness. There is no guarding.  Musculoskeletal: Normal range of motion.        General: Tenderness present.     Right hip: Normal.     Right knee:  Normal.     Right ankle: She exhibits no swelling, no deformity and normal pulse. Tenderness. Achilles tendon exhibits pain. Achilles tendon exhibits normal Thompson's test results.       Feet:  Lymphadenopathy:     Head:     Right side of head: No submandibular adenopathy.     Left side of head: No submandibular adenopathy.     Cervical: No cervical adenopathy.  Skin:    General: Skin is warm and dry.  Neurological:     Mental Status: She is alert and oriented to person, place, and time.     Cranial Nerves: No cranial nerve deficit.     Sensory: No sensory deficit.  Psychiatric:        Speech: Speech normal.      ED Treatments / Results  Labs (all labs ordered are listed, but only abnormal results are displayed) Labs Reviewed - No data to display  EKG None  Radiology Dg Ankle Complete Right  Result Date: 06/13/2018 CLINICAL DATA:  Lateral ankle pain after twisting injury 1 week ago EXAM: RIGHT ANKLE - COMPLETE 3+ VIEW COMPARISON:  None. FINDINGS: There is no evidence of fracture, dislocation, or joint effusion. There is no evidence of arthropathy or other focal bone abnormality. Soft tissues are unremarkable. IMPRESSION: Negative. If pain persists despite conservative therapy, MRI may be warranted for further characterization. Electronically Signed   By: Gaylyn Rong [name]   On: 06/13/2018 23:38    Procedures Procedures (including critical care time)  Medications Ordered in ED Medications - No data to display   Initial Impression / Assessment and Plan / ED Course  I have reviewed the triage vital signs and the nursing notes.  Pertinent labs & imaging results that were available during my care of the patient were reviewed by me and considered in  my medical decision making (see chart for details).       Final Clinical Impressions(s) / ED Diagnoses MDM  Vital signs reviewed.  No neurovascular deficits appreciated of the right lower extremity.  No temperature  changes noted when compared to the left lower extremity.  The Achilles tendon is intact.  X-ray is negative for fracture or dislocation.  The examination favors a strain/sprain of the ankle.  I discussed with the patient however that there could easily be a small avulsion or a hairline fracture that is not showing up yet on the plain x-ray films.  Patient fitted with an ankle stirrup splint.  I have asked her to continue to elevate the right lower extremity.  She will continue Tylenol and ibuprofen.  Patient is to follow-up with Dr. Romeo Apple for orthopedic evaluation and management if not improving.   Final diagnoses:  Sprain of right ankle, unspecified ligament, initial encounter  Right ankle pain, unspecified chronicity    ED Discharge Orders    None       Ivery Quale, PA-C 06/14/18 0006    Mesner, Barbara Cower, MD 06/14/18 [phone]

## 2018-06-13 NOTE — ED Triage Notes (Signed)
Pt states she fell earlier this week and has R ankle pain.

## 2018-06-14 NOTE — Discharge Instructions (Addendum)
Your neurologic and vascular examination of the right lower extremity are well within normal limits.  The x-ray is negative for fracture or dislocation at this time.  If your pain continues despite the elevation of your ankle, and the use of the ankle stirrup splint, please make an appointment with Dr. Romeo AppleHarrison for orthopedic evaluation and management.  Continue Tylenol and ibuprofen.  Continue to elevate your lower extremity.  Use the ankle stirrup splint when you are up and about.  You do not need to sleep in this device.

## 2018-06-15 ENCOUNTER — Telehealth: Payer: Self-pay | Admitting: Orthopedic Surgery

## 2018-06-15 NOTE — Telephone Encounter (Signed)
I spoke with both priimary care doctor's office and with patient; appointment is scheduled at Dr Scharlene GlossHall's office tomorrow, 06/16/18; pt aware. Referral should follow.

## 2018-06-15 NOTE — Telephone Encounter (Signed)
Call received from patient [facility] for right ankle injury; states told by Emergency room provider it may be a ligament problem and may need MRI. Offered appointment upon receipt of referral from primary care, Dr "Timothy LassoZack" Margo AyeHall, per insurance requirement. States will call and request appointment/referral with PCP so that she may schedule appointment here as soon as possible - states wants to see Dr Romeo AppleHarrison.

## 2018-06-16 ENCOUNTER — Encounter: Payer: Self-pay | Admitting: Orthopaedic Surgery

## 2018-06-16 ENCOUNTER — Ambulatory Visit (INDEPENDENT_AMBULATORY_CARE_PROVIDER_SITE_OTHER): Payer: Medicaid Other | Admitting: Orthopaedic Surgery

## 2018-06-16 VITALS — BP 117/79 | HR 77 | Ht 67.0 in | Wt 239.0 lb

## 2018-06-16 DIAGNOSIS — S96911A Strain of unspecified muscle and tendon at ankle and foot level, right foot, initial encounter: Secondary | ICD-10-CM

## 2018-06-16 NOTE — Progress Notes (Signed)
Subjective:    Patient ID: [name], female    DOB: 09/30/1985, 32 y.o.   MRN: [id number]  HPI She hurt her right ankle around December 8th getting out of a SUV.  She went to the ER on 06-13-18 because of continued pain.  She had x-rays which were negative.  She was given an ankle brace which does not help.  She has no crutches. She continues to have lateral ankle pain.  She has no redness, no weakness.  She says the pain is bad.  I have recommended a CAM walker, crutches, contrast bath (instruction sheet given), limited weight bearing.  Return in two weeks.  Advil for pain.   Review of Systems  Constitutional: Positive for activity change.  Musculoskeletal: Positive for arthralgias, gait problem and joint swelling.  Psychiatric/Behavioral: The patient is nervous/anxious.   All other systems reviewed and are negative.  For Review of Systems, all other systems reviewed and are negative.  The following is a summary of the past history medically, past history surgically, known current medicines, social history and family history.  This information is gathered electronically by the computer from prior information and documentation.  I review this each visit and have found including this information at this point in the chart is beneficial and informative.   Past Medical History:  Diagnosis Date  . Anxiety   . 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 Bipolar disorder (HCC)   . Cervicalgia   . Childhood asthma    seasonal with allergies  . Complication of anesthesia    hard to wake up post-op  . Depression   . Family history of adverse reaction to anesthesia    pt's father has hx. of being hard to wake up post-op  . Headache   . History of Chiari malformation   . History of MRSA infection    axilla  . 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 Panic attacks   . PTSD (post-traumatic stress disorder)   . Shoulder dislocation  06/2015   right    Past Surgical History:  Procedure Laterality Date  . BRAIN SURGERY    . CESAREAN SECTION  09/05/2008  . CYSTOSCOPY N/A 09/15/2012   Procedure: CYSTOSCOPY;  Surgeon: Leslie AndreaJames E Tomblin II, MD;  Location: [facility];  Service: Gynecology;  Laterality: N/A;  . DILATION AND EVACUATION  04/15/2007  . HYSTEROSCOPY W/D&C  03/31/2012   Procedure: DILATATION AND CURETTAGE /HYSTEROSCOPY;  Surgeon: Leslie AndreaJames E Tomblin II, MD;  Location: [facility];  Service: Gynecology;  Laterality: N/A;  . LAPAROSCOPIC APPENDECTOMY N/A 10/29/2016   Procedure: APPENDECTOMY LAPAROSCOPIC;  Surgeon: Franky MachoJenkins, Mark, MD;  Location: [facility];  Service: General;  Laterality: N/A;  . LAPAROSCOPIC ASSISTED VAGINAL HYSTERECTOMY N/A 09/15/2012   Procedure: LAPAROSCOPIC ASSISTED VAGINAL HYSTERECTOMY;  Surgeon: Leslie AndreaJames E Tomblin II, MD;  Location: [facility];  Service: Gynecology;  Laterality: N/A;  . LAPAROSCOPY  03/31/2012   Procedure: LAPAROSCOPY OPERATIVE;  Surgeon: Leslie AndreaJames E Tomblin II, MD;  Location: [facility];  Service: Gynecology;  Laterality: N/A;  with Biopsies of Bilateral Fallopian Tubes; Fulguration of Endometriosis  . SHOULDER ARTHROSCOPY WITH CAPSULORRHAPHY Right 06/28/2015   Procedure: RIGHT SHOULDER ARTHROSCOPY WITH CAPSULORRHAPHY;  Surgeon: Frederico Hammananiel Caffrey, MD;  Location: [facility];  Service: Orthopedics;  Laterality: Right;  . SUBOCCIPITAL CRANIECTOMY CERVICAL LAMINECTOMY N/A 08/08/2016   Procedure: SUBOCCIPITAL CRANIECTOMY CERVICAL LAMINECTOMY NUMBER DURAPLASTY;  Surgeon: Tressie StalkerJeffrey Jenkins, MD;  [facility];  Service: Neurosurgery;  Laterality: N/A;  suboccipital  . ULNAR NERVE REPAIR Right 05/2015    Current Outpatient Medications on File Prior to Visit  Medication Sig Dispense Refill  . Cariprazine HCl (VRAYLAR) 1.5 MG CAPS Take 1 capsule by mouth daily.    Marland Kitchen linaclotide (LINZESS) 145 MCG CAPS capsule Take 145 mcg by mouth daily as needed (constipation).    . methylPREDNISolone (MEDROL DOSEPAK) 4 MG TBPK  tablet follow package directions 21 tablet 1  . mupirocin nasal ointment (BACTROBAN) 2 % Apply to affected area 3 times daily for 10 days 22 g 0  . nicotine (NICODERM CQ - DOSED IN MG/24 HOURS) 14 mg/24hr patch Place 14 mg onto the skin daily as needed (smoking cessation).    . prochlorperazine (COMPAZINE) 5 MG tablet Take 1 tablet (5 mg total) by mouth every 6 (six) hours as needed for nausea or vomiting. May cause sedation. 30 tablet 2  . tiZANidine (ZANAFLEX) 4 MG tablet Take 1 tablet (4 mg total) by mouth every 6 (six) hours as needed for muscle spasms. May also take for headache/migraine.Watch for sedation. 30 tablet 0  . topiramate (TOPAMAX) 25 MG tablet Take 4 tablets (100 mg total) by mouth at bedtime. 120 tablet 6   No current facility-administered medications on file prior to visit.     Social History   Socioeconomic History  . Marital status: Single    Spouse name: Not on file  . Number of children: 1  . Years of education: Not on file  . Highest education level: Not on file  Occupational History  . Occupation: Consulting civil engineer  Social Needs  . Financial resource strain: Not on file  . Food insecurity:    Worry: Not on file    Inability: Not on file  . Transportation needs:    Medical: Not on file    Non-medical: Not on file  Tobacco Use  . Smoking status: Current Every Day Smoker    Packs/day: 1.00    Years: 10.00    Pack years: 10.00    Types: Cigarettes  . Smokeless tobacco: Never Used  . Tobacco comment: quit 2 months ago  Substance and Sexual Activity  . Alcohol use: No    Comment: social  . Drug use: No  . Sexual activity: Yes    Birth control/protection: Surgical  Lifestyle  . Physical activity:    Days per week: Not on file    Minutes per session: Not on file  . Stress: Not on file  Relationships  . Social connections:    Talks on phone: Not on file    Gets together: Not on file    Attends religious service: Not on file    Active member of club or  organization: Not on file    Attends meetings of clubs or organizations: Not on file    Relationship status: Not on file  . Intimate partner violence:    Fear of current or ex partner: Not on file    Emotionally abused: Not on file    Physically abused: Not on file    Forced sexual activity: Not on file  Other Topics Concern  . Not on file  Social History Narrative   Lives at home w/ her mom and son   Current student    Family History  Problem Relation Age of Onset  . Anesthesia problems Father        hard to wake up post-op  . Stroke Father     BP 117/79  Pulse 77   Ht 5\' 7"  (1.702 m)   Wt 239 lb (108.4 kg)   LMP 08/18/2012   BMI 37.43 kg/m   Body mass index is 37.43 kg/m.     Objective:   Physical Exam Constitutional:      Appearance: She is well-developed.  HENT:     Head: Normocephalic and atraumatic.  Eyes:     Conjunctiva/sclera: Conjunctivae normal.     Pupils: Pupils are equal, round, and reactive to light.  Neck:     Musculoskeletal: Normal range of motion and neck supple.  Cardiovascular:     Rate and Rhythm: Normal rate and regular rhythm.  Pulmonary:     Effort: Pulmonary effort is normal.  Abdominal:     Palpations: Abdomen is soft.  Musculoskeletal:       Feet:  Skin:    General: Skin is warm and dry.  Neurological:     Mental Status: She is alert and oriented to person, place, and time.     Cranial Nerves: No cranial nerve deficit.     Motor: No abnormal muscle tone.     Coordination: Coordination normal.     Deep Tendon Reflexes: Reflexes are normal and symmetric. Reflexes normal.  Psychiatric:        Behavior: Behavior normal.        Thought Content: Thought content normal.        Judgment: Judgment normal.      I have reviewed the ER records, x-rays and report.     Assessment & Plan:   Encounter Diagnosis  Name Primary?  . Strain of right ankle, initial encounter Yes   Return in two weeks.  Call if any  problem.  Precautions discussed.   Rx given for crutches.  Electronically Signed Darreld Mclean, MD 12/17/20192:17 PM

## 2018-06-16 NOTE — Telephone Encounter (Signed)
Referral received; appointment scheduled accordingly, for today, 06/16/18, as patient requested; patient and provider aware.

## 2018-06-17 ENCOUNTER — Encounter: Payer: Self-pay | Admitting: Orthopaedic Surgery

## 2018-06-17 ENCOUNTER — Telehealth: Payer: Self-pay | Admitting: Orthopaedic Surgery

## 2018-06-17 NOTE — Telephone Encounter (Signed)
Patient called after speaking with her employer - states her job is through [employer]; relays if she remains out of work she will not have the job. May she return to work today, 06/17/18? Also said they may have light duty available; otherwise, said she is fine with returning full duty.

## 2018-06-17 NOTE — Telephone Encounter (Signed)
She can have light duty if available.  Whatever she can do that she is agreeable to.

## 2018-06-17 NOTE — Telephone Encounter (Signed)
Spoke with patient; aware note is ready.

## 2018-07-08 ENCOUNTER — Ambulatory Visit: Payer: Medicaid Other | Admitting: Orthopedic Surgery

## 2018-07-22 ENCOUNTER — Encounter: Payer: Self-pay | Admitting: Orthopedic Surgery

## 2018-07-22 ENCOUNTER — Ambulatory Visit: Payer: Medicaid Other | Admitting: Orthopedic Surgery

## 2018-07-22 VITALS — BP 122/77 | HR 83 | Ht 67.0 in | Wt 241.0 lb

## 2018-07-22 DIAGNOSIS — M7661 Achilles tendinitis, right leg: Secondary | ICD-10-CM

## 2018-07-22 NOTE — Progress Notes (Signed)
Progress Note   Patient ID: Monica MontgomeryDanielle B [name], female   DOB: 06/23/1986, 33 y.o.   MRN: [id number]   Chief Complaint  Patient presents with  . Ankle Pain    since injury on 06/07/18 right ankle pain     HPI The patient presents for evaluation of persistent pain right ankle after injury in December.  She previously saw my partner Dr.Keeling.  She comes in complaining of pain near her Achilles with swelling of the retrocalcaneal bursa  She confirms the history we have noted in the chart back on December 8 she was getting out of a car she turned her ankle she walked on it for about a week then went to the emergency room because of persistent pain she followed up with her primary care doctor who sent her to us via Dr. Tresa EndoKelly for evaluation.  She was placed in a cam walker after failing treatment with a ASO brace  She says she was getting better until a week or so ago she took off running to help someone who was injured and since that time she is noticed swelling of the retrocalcaneal bursa tenderness tightness in her Achilles which radiates up into her knee area.  By the end of the day she has severe throbbing pain in the same region  Review of Systems  Skin: Negative.   Neurological: Negative for tingling and focal weakness.   Current Meds  Medication Sig  . buPROPion HCl (WELLBUTRIN PO) Take by mouth.    Past Medical History:  Diagnosis Date  . Anxiety   . 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 Bipolar disorder (HCC)   . Cervicalgia   . Childhood asthma    seasonal with allergies  . Complication of anesthesia    hard to wake up post-op  . Depression   . Family history of adverse reaction to anesthesia    pt's father has hx. of being hard to wake up post-op  . Headache   . History of Chiari malformation   . History of MRSA infection    axilla  . 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 Panic attacks   . PTSD (post-traumatic  stress disorder)   . Shoulder dislocation 06/2015   right     Allergies  Allergen Reactions  . Dilaudid [Hydromorphone Hcl] Shortness Of Breath  . Adhesive [Tape] Rash  . Morphine And Related Itching and Nausea And Vomiting    Have to give benadryl with morphine, also nausea/vomiting     BP 122/77   Pulse 83   Ht 5\' 7"  (1.702 m)   Wt 241 lb (109.3 kg)   LMP 08/18/2012   BMI 37.75 kg/m    Physical Exam General appearance normal Oriented x3 normal Mood pleasant affect normal  gait slight limp is noted involving the right ankle  Ortho Exam Left ankle  Inspection and palpation revealed no abnormalities Range of motion is full No instability was detected on stress testing Muscle tone and strength was normal without tremor Skin was warm dry and intact Good pulse and temperature were noted in the extremity Sensation revealed no abnormalities to light touch  Right ankle there is some discoloration of the skin in the area of ankle sprains but this area is nontender she has a stable drawer and a stable inversion test peroneals do not show subluxation dislocation there is no tenderness in this area she does have swelling in the retrocalcaneal bursa with tenderness of the  Achilles which exacerbated by dorsiflexion which shows some tightness.  She also has some tenderness in the back of her leg and in the calf muscle on the right side.  There are no skin lesions.  Pulse and temperature are normal and there are no sensory deficits   MEDICAL DECISION MAKING   The x-ray in her chart was reviewed I do not see any fracture or dislocation or bony abnormality   Encounter Diagnosis  Name Primary?  . Achilles bursitis of right lower extremity Yes     PLAN: (RX., injection, surgery,frx,mri/ct, XR 2 body ares)  Recommend stretching exercises heel lift and follow-up with [place] in 6 weeks  No orders of the defined types were placed in this encounter.  4:53 PM 07/22/2018

## 2018-07-27 ENCOUNTER — Ambulatory Visit: Payer: Medicaid Other | Admitting: Orthopedic Surgery

## 2018-08-03 ENCOUNTER — Telehealth: Payer: Self-pay | Admitting: Orthopedic Surgery

## 2018-08-03 NOTE — Telephone Encounter (Signed)
I called her advised d/c heel Iift. Continue to ice stretch and come in sooner than scheduled  To Angie, can you call her with sooner appt when Dr Romeo Apple in back in the office  To Dr Mauri Brooklyn only she is getting worse.

## 2018-08-03 NOTE — Telephone Encounter (Signed)
[name] called today stating she is did get the heel lifts for her shoes and now she is having stabbing pain in her heel.    She wants to know what to do.  Could you please call her at [phone]  Thanks

## 2018-08-04 ENCOUNTER — Other Ambulatory Visit (HOSPITAL_BASED_OUTPATIENT_CLINIC_OR_DEPARTMENT_OTHER): Payer: Self-pay

## 2018-08-04 DIAGNOSIS — G4730 Sleep apnea, unspecified: Secondary | ICD-10-CM

## 2018-08-04 NOTE — Telephone Encounter (Signed)
Tell her to get over the counter insole with gel pad in the heel take heel lift out

## 2018-08-04 NOTE — Telephone Encounter (Signed)
I have advised her of this, she states she has severe pain, she has a boot and crutches. I have advised her if it feels better in boot and on crutches to go back on them. She states she will.

## 2018-08-12 ENCOUNTER — Encounter: Payer: Self-pay | Admitting: Orthopedic Surgery

## 2018-08-12 ENCOUNTER — Ambulatory Visit: Payer: Medicaid Other | Admitting: Orthopedic Surgery

## 2018-08-12 VITALS — BP 122/82 | HR 89 | Ht 67.0 in | Wt 241.0 lb

## 2018-08-12 DIAGNOSIS — M7661 Achilles tendinitis, right leg: Secondary | ICD-10-CM

## 2018-08-12 MED ORDER — ACETAMINOPHEN-CODEINE #3 300-30 MG PO TABS: 1.0000 | ORAL_TABLET | Freq: Four times a day (QID) | ORAL | 0 refills | Status: DC | PRN

## 2018-08-12 MED ORDER — PREDNISONE 10 MG PO TABS: 10.0000 mg | ORAL_TABLET | Freq: Every day | ORAL | 0 refills | Status: DC

## 2018-08-12 NOTE — Progress Notes (Signed)
Chief Complaint  Patient presents with  . Foot Pain   History of present illness 33 year old female having trouble with her right Achilles tendon.  Dr. Hilda Lias saw her back in December she was placed in a cam walker at some point and wore that with no relief.  She presented to me in January with retrocalcaneal bursitis and Achilles tendinitis and we added stretching exercises a heel lift and recommended a 6-week follow-up  The pain worsened for her she came in sooner than her scheduled appointment she says Achilles tendon is still tender painful is hard for her to walk the pain is located at the insertion and just anterior to the Achilles tendon.  It is painful for her to walk  Review of systems she denies any plantar pain she does not have any numbness or tingling she does have some pain on the top of the foot  BP 122/82   Pulse 89   Ht 5\' 7"  (1.702 m)   Wt 241 lb (109.3 kg)   LMP 08/18/2012   BMI 37.75 kg/m  She is awake alert and oriented  Normal mood and affect  Normal grooming   she is ambulatory with a small heel she has a slight limp on the right side  She is tender over the insertion and portion of the distal 2 cm of the Achilles tendon there is no nodularity in it.  She has painful passive dorsiflexion normal plantarflexion strength ankle stability is normal she has tenderness in the retrocalcaneal bursa.  Neurovascular exam is intact  Encounter Diagnosis  Name Primary?  . Achilles bursitis of right lower extremity Yes    Right achilles pain   Right foot inject heel bursa The patient gave consent for [place] to inject the area with the understanding of risk of associated tendon rupture.  We prepped the area with alcohol we sprayed with ethyl chloride  I used a 25-gauge needle we entered the retrocalcaneal bursa and injected with no complications  We placed her on medication and told her to keep her previously scheduled appointment she will go back into the cam walker  Meds  ordered this encounter  Medications  . predniSONE (DELTASONE) 10 MG tablet    Sig: Take 1 tablet (10 mg total) by mouth daily.    Dispense:  60 tablet    Refill:  0  . DISCONTD: acetaminophen-codeine (TYLENOL #3) 300-30 MG tablet    Sig: Take 1 tablet by mouth every 6 (six) hours as needed for moderate pain.    Dispense:  30 tablet    Refill:  0  . acetaminophen-codeine (TYLENOL #3) 300-30 MG tablet    Sig: Take 1 tablet by mouth every 6 (six) hours as needed for moderate pain.    Dispense:  30 tablet    Refill:  0

## 2018-08-13 ENCOUNTER — Other Ambulatory Visit: Payer: Self-pay

## 2018-08-13 ENCOUNTER — Encounter (HOSPITAL_COMMUNITY): Payer: Self-pay | Admitting: Emergency Medicine

## 2018-08-13 ENCOUNTER — Emergency Department (HOSPITAL_COMMUNITY)
Admission: EM | Admit: 2018-08-13 | Discharge: 2018-08-13 | Disposition: A | Discharge status: Home / Self Care | Payer: Medicaid Other | Attending: Emergency Medicine | Admitting: Emergency Medicine

## 2018-08-13 ENCOUNTER — Telehealth: Payer: Self-pay | Admitting: Orthopedic Surgery

## 2018-08-13 DIAGNOSIS — M25571 Pain in right ankle and joints of right foot: Secondary | ICD-10-CM | POA: Diagnosis present

## 2018-08-13 DIAGNOSIS — Z79899 Other long term (current) drug therapy: Secondary | ICD-10-CM | POA: Diagnosis not present

## 2018-08-13 DIAGNOSIS — F17210 Nicotine dependence, cigarettes, uncomplicated: Secondary | ICD-10-CM | POA: Insufficient documentation

## 2018-08-13 MED ORDER — KETOROLAC TROMETHAMINE 60 MG/2ML IM SOLN
60.0000 mg | Freq: Once | INTRAMUSCULAR | Status: AC
  Administered 2018-08-13: 60 mg via INTRAMUSCULAR
  Filled 2018-08-13: qty 2

## 2018-08-13 NOTE — Discharge Instructions (Addendum)
Follow  up with your ortho doctor. Return for fevers, spreading/streaking redness up your leg, worsening symptoms. Use Tylenol, ibuprofen, ice as needed.  Gradually increase ambulation.

## 2018-08-13 NOTE — ED Provider Notes (Signed)
[facility] EMERGENCY DEPARTMENT Provider Note   CSN: [id number] Arrival date & time: 08/13/18  2671     History   Chief Complaint Chief Complaint  Patient presents with  . Medication Reaction    HPI [name] is a 33 y.o. female.  Patient with history of bipolar, recurrent right ankle pain, recent steroid injection and right ankle bursa presents with worsening pain.  Patient had injection yesterday and has had worsening pain there since.  No swelling of the ankle, no rash or spreading redness no fevers or vomiting.  Pain worse with movement palpation at the site and Achilles area.     Past Medical History:  Diagnosis Date  . Anxiety   . 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 Bipolar disorder (HCC)   . Cervicalgia   . Childhood asthma    seasonal with allergies  . Complication of anesthesia    hard to wake up post-op  . Depression   . Family history of adverse reaction to anesthesia    pt's father has hx. of being hard to wake up post-op  . Headache   . History of Chiari malformation   . History of MRSA infection    axilla  . 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 Panic attacks   . PTSD (post-traumatic stress disorder)   . Shoulder dislocation 06/2015   right    Patient Active Problem List   Diagnosis Date Noted  . 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 Acute appendicitis   . 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 Endometriosis 09/16/2012  . CERVICALGIA 09/08/2007  . CERVICAL SPASM 09/08/2007    Past Surgical History:  Procedure Laterality Date  . BRAIN SURGERY    . CESAREAN SECTION  09/05/2008  . CYSTOSCOPY N/A 09/15/2012   Procedure: CYSTOSCOPY;  Surgeon: Leslie Andrea, MD;  Location: [facility];  Service: Gynecology;  Laterality: N/A;  . DILATION AND EVACUATION  04/15/2007  . HYSTEROSCOPY W/D&C  03/31/2012   Procedure: DILATATION AND CURETTAGE  /HYSTEROSCOPY;  Surgeon: Leslie Andrea, MD;  Location: [facility];  Service: Gynecology;  Laterality: N/A;  . LAPAROSCOPIC APPENDECTOMY N/A 10/29/2016   Procedure: APPENDECTOMY LAPAROSCOPIC;  Surgeon: Franky Macho, MD;  Location: [facility];  Service: General;  Laterality: N/A;  . LAPAROSCOPIC ASSISTED VAGINAL HYSTERECTOMY N/A 09/15/2012   Procedure: LAPAROSCOPIC ASSISTED VAGINAL HYSTERECTOMY;  Surgeon: Leslie Andrea, MD;  Location: [facility];  Service: Gynecology;  Laterality: N/A;  . LAPAROSCOPY  03/31/2012   Procedure: LAPAROSCOPY OPERATIVE;  Surgeon: Leslie Andrea, MD;  Location: [facility];  Service: Gynecology;  Laterality: N/A;  with Biopsies of Bilateral Fallopian Tubes; Fulguration of Endometriosis  . SHOULDER ARTHROSCOPY WITH CAPSULORRHAPHY Right 06/28/2015   Procedure: RIGHT SHOULDER ARTHROSCOPY WITH CAPSULORRHAPHY;  Surgeon: Frederico Hamman, MD;  Location: [facility];  Service: Orthopedics;  Laterality: Right;  . SUBOCCIPITAL CRANIECTOMY CERVICAL LAMINECTOMY N/A 08/08/2016   Procedure: SUBOCCIPITAL CRANIECTOMY CERVICAL LAMINECTOMY NUMBER DURAPLASTY;  Surgeon: Tressie Stalker, MD;  Location: [facility] OR;  Service: Neurosurgery;  Laterality: N/A;  suboccipital  . ULNAR NERVE REPAIR Right 05/2015     OB History    Gravida  2   Para  1   Term  1   Preterm      AB  1   Living  1     SAB  1   TAB      Ectopic  Multiple      Live Births               Home Medications    Prior to Admission medications   Medication Sig Start Date End Date Taking? Authorizing Provider  acetaminophen-codeine (TYLENOL #3) 300-30 MG tablet Take 1 tablet by mouth every 6 (six) hours as needed for moderate pain. 08/12/18   Vickki HearingHarrison, Stanley E, MD  buPROPion [facility](WELLBUTRIN SR) 100 MG 12 hr tablet Wellbutrin SR 100 mg tablet, 12 hr sustained-release  Take 1 tablet every day by oral route.    [provider]  buPROPion HCl (WELLBUTRIN PO) Take by mouth.    [provider]  Cariprazine HCl (VRAYLAR) 1.5 MG CAPS Take 1 capsule by mouth daily.    [provider]  linaclotide (LINZESS) 145 MCG CAPS capsule Take 145 mcg by mouth daily as needed (constipation).    [provider]  nicotine (NICODERM CQ - DOSED IN MG/24 HOURS) 14 mg/24hr patch Place 14 mg onto the skin daily as needed (smoking cessation).    [provider]  predniSONE (DELTASONE) 10 MG tablet Take 1 tablet (10 mg total) by mouth daily. 08/12/18   Vickki HearingHarrison, Stanley E, MD  prochlorperazine (COMPAZINE) 5 MG tablet Take 1 tablet (5 mg total) by mouth every 6 (six) hours as needed for nausea or vomiting. May cause sedation. Patient not taking: Reported on 07/22/2018 03/28/17   Anson FretAhern, Antonia B, MD  tiZANidine (ZANAFLEX) 4 MG tablet Take 1 tablet (4 mg total) by mouth every 6 (six) hours as needed for muscle spasms. May also take for headache/migraine.Watch for sedation. Patient not taking: Reported on 07/22/2018 03/28/17   Anson FretAhern, Antonia B, MD  topiramate (TOPAMAX) 25 MG tablet Take 4 tablets (100 mg total) by mouth at bedtime. Patient not taking: Reported on 07/22/2018 03/24/17   Anson FretAhern, Antonia B, MD    Family History Family History  Problem Relation Age of Onset  . Anesthesia problems Father        hard to wake up post-op  . Stroke Father     Social History Social History   Tobacco Use  . Smoking status: Current Every Day Smoker    Packs/day: 1.00    Years: 10.00    Pack years: 10.00    Types: Cigarettes  . Smokeless tobacco: Never Used  . Tobacco comment: quit 2 months ago  Substance Use Topics  . Alcohol use: No    Comment: social  . Drug use: No     Allergies   Dilaudid [hydromorphone hcl]; Adhesive [tape]; and Morphine and related   Review of Systems Review of Systems  Constitutional: Negative for chills and fever.  HENT: Negative for congestion.   Respiratory: Negative for shortness of breath.   Cardiovascular: Negative for chest pain.    Gastrointestinal: Negative for abdominal pain and vomiting.  Musculoskeletal: Negative for back pain, neck pain and neck stiffness.  Skin: Negative for rash.  Neurological: Negative for light-headedness and headaches.     Physical Exam Updated Vital Signs BP (!) 124/95 (BP Location: Left Arm)   Pulse 74   Temp 98.3 F (36.8 C) (Oral)   Resp 19   Ht 5\' 7"  (1.702 m)   Wt 109 kg   LMP 08/18/2012   SpO2 99%   BMI 37.64 kg/m   Physical Exam Vitals signs and nursing note reviewed.  Constitutional:      Appearance: She is well-developed.  HENT:     Head: Normocephalic and  atraumatic.  Eyes:     General:        Right eye: No discharge.        Left eye: No discharge.  Neck:     Trachea: No tracheal deviation.  Cardiovascular:     Rate and Rhythm: Normal rate.  Pulmonary:     Effort: Pulmonary effort is normal.  Skin:    General: Skin is warm.     Findings: No rash.     Comments: Patient has mild tenderness to palpation of lateral Achilles just posterior to malleoli.  No external sign of infection.  No joint effusion.  Mild tenderness to palpation of that area.  No laxity in the Achilles tendon.  No spreading erythema.  Patient has full range of motion of ankle joint with minimal discomfort.  Neurological:     Mental Status: She is alert and oriented to person, place, and time.      ED Treatments / Results  Labs (all labs ordered are listed, but only abnormal results are displayed) Labs Reviewed - No data to display  EKG None  Radiology No results found.  Procedures Procedures (including critical care time)  Medications Ordered in ED Medications  ketorolac (TORADOL) injection 60 mg (60 mg Intramuscular Given 08/13/18 0838)     Initial Impression / Assessment and Plan / ED Course  I have reviewed the triage vital signs and the nursing notes.  Pertinent labs & imaging results that were available during my care of the patient were reviewed by me and  considered in my medical decision making (see chart for details).    Patient presents with pain since injection.  No sign of infection at this time.  I do not think this is a septic joint.  Discussed ice NSAIDs and follow-up with orthopedic surgeon.  No direct trauma.  Final Clinical Impressions(s) / ED Diagnoses   Final diagnoses:  Acute right ankle pain    ED Discharge Orders    None       Blane Ohara, MD 08/13/18 [phone]

## 2018-08-13 NOTE — ED Triage Notes (Signed)
Pain to RT ankle. Had injection in this ankle yesterday.

## 2018-08-13 NOTE — Telephone Encounter (Signed)
Patient relays she had gone to [facility] Emergency room last night. States was concerned she may have been having a reaction from the injection she had yesterday, 08/12/2018. York SpanielSaid was instruction to continue ice, and told to call our office.  Ph# [phone]

## 2018-08-13 NOTE — Telephone Encounter (Signed)
She c/o pain from injection, this morning. I advised her sometimes the pain increases after the numbing wears off and the sterioid takes a little more time to work. She states the Emergency room told her she has neuritis and she wants to know if this is what she has. I advised her I have never heard of neuritis from a steroid injection usually it is just soreness from the injection, and takes time to help. She wants me to ask you

## 2018-08-13 NOTE — Telephone Encounter (Signed)
Done. (patient seen on 08/12/2018 as noted)

## 2018-08-14 ENCOUNTER — Telehealth: Payer: Self-pay | Admitting: Radiology

## 2018-08-14 ENCOUNTER — Other Ambulatory Visit: Payer: Self-pay

## 2018-08-14 ENCOUNTER — Encounter (HOSPITAL_COMMUNITY): Payer: Self-pay | Admitting: Emergency Medicine

## 2018-08-14 ENCOUNTER — Emergency Department (HOSPITAL_COMMUNITY)
Admission: EM | Admit: 2018-08-14 | Discharge: 2018-08-14 | Disposition: A | Discharge status: Home / Self Care | Payer: Medicaid Other | Attending: Emergency Medicine | Admitting: Emergency Medicine

## 2018-08-14 DIAGNOSIS — M25571 Pain in right ankle and joints of right foot: Secondary | ICD-10-CM | POA: Diagnosis not present

## 2018-08-14 DIAGNOSIS — F17210 Nicotine dependence, cigarettes, uncomplicated: Secondary | ICD-10-CM | POA: Insufficient documentation

## 2018-08-14 DIAGNOSIS — R112 Nausea with vomiting, unspecified: Secondary | ICD-10-CM | POA: Insufficient documentation

## 2018-08-14 DIAGNOSIS — J45909 Unspecified asthma, uncomplicated: Secondary | ICD-10-CM | POA: Insufficient documentation

## 2018-08-14 DIAGNOSIS — Z79899 Other long term (current) drug therapy: Secondary | ICD-10-CM | POA: Diagnosis not present

## 2018-08-14 LAB — BASIC METABOLIC PANEL
Anion gap: 10 (ref 5–15)
BUN: 13 mg/dL (ref 6–20)
CO2: 24 mmol/L (ref 22–32)
Calcium: 9 mg/dL (ref 8.9–10.3)
Chloride: 107 mmol/L (ref 98–111)
Creatinine, Ser: 0.96 mg/dL (ref 0.44–1.00)
GFR calc Af Amer: >60 mL/min (ref >60)
GFR calc non Af Amer: >60 mL/min (ref >60)
Glucose, Bld: 84 mg/dL (ref 70–99)
Potassium: 3.6 mmol/L (ref 3.5–5.1)
Sodium: 141 mmol/L (ref 135–145)

## 2018-08-14 LAB — CBC WITH DIFFERENTIAL/PLATELET
Abs Immature Granulocytes: 0.03 10*3/uL (ref 0.00–0.07)
Basophils Absolute: 0 10*3/uL (ref 0.0–0.1)
Basophils Relative: 0 %
Eosinophils Absolute: 0.1 10*3/uL (ref 0.0–0.5)
Eosinophils Relative: 1 %
HCT: 46.8 % — ABNORMAL HIGH (ref 36.0–46.0)
Hemoglobin: 15.3 g/dL — ABNORMAL HIGH (ref 12.0–15.0)
Immature Granulocytes: 0 %
Lymphocytes Relative: 23 %
Lymphs Abs: 2 10*3/uL (ref 0.7–4.0)
MCH: 31.6 pg (ref 26.0–34.0)
MCHC: 32.7 g/dL (ref 30.0–36.0)
MCV: 96.7 fL (ref 80.0–100.0)
Monocytes Absolute: 0.4 10*3/uL (ref 0.1–1.0)
Monocytes Relative: 4 %
Neutro Abs: 6.1 10*3/uL (ref 1.7–7.7)
Neutrophils Relative %: 72 %
Platelets: 254 10*3/uL (ref 150–400)
RBC: 4.84 MIL/uL (ref 3.87–5.11)
RDW: 12 % (ref 11.5–15.5)
WBC: 8.6 10*3/uL (ref 4.0–10.5)
nRBC: 0 % (ref 0.0–0.2)

## 2018-08-14 MED ORDER — ONDANSETRON 4 MG PO TBDP
ORAL_TABLET | ORAL | Status: AC
  Filled 2018-08-14: qty 1

## 2018-08-14 MED ORDER — METOCLOPRAMIDE HCL 5 MG/ML IJ SOLN
10.0000 mg | Freq: Once | INTRAMUSCULAR | Status: AC
  Administered 2018-08-14: 10 mg via INTRAVENOUS
  Filled 2018-08-14: qty 2

## 2018-08-14 MED ORDER — ONDANSETRON 4 MG PO TBDP
4.0000 mg | ORAL_TABLET | Freq: Once | ORAL | Status: AC | PRN
  Administered 2018-08-14: 4 mg via ORAL

## 2018-08-14 MED ORDER — PROMETHAZINE HCL 25 MG PO TABS: 25.0000 mg | ORAL_TABLET | Freq: Four times a day (QID) | ORAL | 0 refills | Status: DC | PRN

## 2018-08-14 MED ORDER — KETOROLAC TROMETHAMINE 30 MG/ML IJ SOLN
30.0000 mg | Freq: Once | INTRAMUSCULAR | Status: AC
  Administered 2018-08-14: 30 mg via INTRAVENOUS
  Filled 2018-08-14: qty 1

## 2018-08-14 MED ORDER — SODIUM CHLORIDE 0.9 % IV BOLUS
1000.0000 mL | Freq: Once | INTRAVENOUS | Status: AC
  Administered 2018-08-14: 1000 mL via INTRAVENOUS

## 2018-08-14 NOTE — ED Provider Notes (Signed)
[facility] EMERGENCY DEPARTMENT Provider Note   CSN: [id number] Arrival date & time: 08/14/18  0932     History   Chief Complaint Chief Complaint  Patient presents with  . Emesis    HPI [name] is a 33 y.o. female.  HPI   [name] is a 33 y.o. female who presents to the Emergency Department complaining of persistent right ankle pain and nausea and vomiting.  She was seen here yesterday for ankle pain after a recent injection into the right ankle bursa by her orthopedic provider.  She reports having worsening pain and nausea since the injection.  She was given Toradol yesterday with minimal to no little relief.  She reports having nausea yesterday but no vomiting. she woke during the evening and reports intermittent vomiting ever since.  She has pain to her ankle that is worse with weightbearing.  She denies abdominal pain, diarrhea, dysuria, redness, fever, chills, and rash.   Past Medical History:  Diagnosis Date  . Anxiety   . 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 Bipolar disorder (HCC)   . Cervicalgia   . Childhood asthma    seasonal with allergies  . Complication of anesthesia    hard to wake up post-op  . Depression   . Family history of adverse reaction to anesthesia    pt's father has hx. of being hard to wake up post-op  . Headache   . History of Chiari malformation   . History of MRSA infection    axilla  . 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 Panic attacks   . PTSD (post-traumatic stress disorder)   . Shoulder dislocation 06/2015   right    Patient Active Problem List   Diagnosis Date Noted  . 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 Acute appendicitis   . 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 Endometriosis 09/16/2012  . CERVICALGIA 09/08/2007  . CERVICAL SPASM 09/08/2007    Past Surgical History:  Procedure Laterality Date  .  BRAIN SURGERY    . CESAREAN SECTION  09/05/2008  . CYSTOSCOPY N/A 09/15/2012   Procedure: CYSTOSCOPY;  Surgeon: Leslie Andrea, MD;  Location: [facility];  Service: Gynecology;  Laterality: N/A;  . DILATION AND EVACUATION  04/15/2007  . HYSTEROSCOPY W/D&C  03/31/2012   Procedure: DILATATION AND CURETTAGE /HYSTEROSCOPY;  Surgeon: Leslie Andrea, MD;  Location: [facility];  Service: Gynecology;  Laterality: N/A;  . LAPAROSCOPIC APPENDECTOMY N/A 10/29/2016   Procedure: APPENDECTOMY LAPAROSCOPIC;  Surgeon: Franky Macho, MD;  Location: [facility];  Service: General;  Laterality: N/A;  . LAPAROSCOPIC ASSISTED VAGINAL HYSTERECTOMY N/A 09/15/2012   Procedure: LAPAROSCOPIC ASSISTED VAGINAL HYSTERECTOMY;  Surgeon: Leslie Andrea, MD;  Location: [facility];  Service: Gynecology;  Laterality: N/A;  . LAPAROSCOPY  03/31/2012   Procedure: LAPAROSCOPY OPERATIVE;  Surgeon: Leslie Andrea, MD;  Location: [facility];  Service: Gynecology;  Laterality: N/A;  with Biopsies of Bilateral Fallopian Tubes; Fulguration of Endometriosis  . SHOULDER ARTHROSCOPY WITH CAPSULORRHAPHY Right 06/28/2015   Procedure: RIGHT SHOULDER ARTHROSCOPY WITH CAPSULORRHAPHY;  Surgeon: Frederico Hamman, MD;  Location: [facility];  Service: Orthopedics;  Laterality: Right;  . SUBOCCIPITAL CRANIECTOMY CERVICAL LAMINECTOMY N/A 08/08/2016   Procedure: SUBOCCIPITAL CRANIECTOMY CERVICAL LAMINECTOMY NUMBER DURAPLASTY;  Surgeon: Tressie Stalker, MD;  Location: [facility] OR;  Service: Neurosurgery;  Laterality: N/A;  suboccipital  . ULNAR NERVE REPAIR Right 05/2015  OB History    Gravida  2   Para  1   Term  1   Preterm      AB  1   Living  1     SAB  1   TAB      Ectopic      Multiple      Live Births               Home Medications    Prior to Admission medications   Medication Sig Start Date End Date Taking? Authorizing Provider  acetaminophen-codeine (TYLENOL #3) 300-30 MG tablet Take 1 tablet by mouth every 6 (six)  hours as needed for moderate pain. 08/12/18   Vickki Hearing, MD  buPROPion [facility] SR) 100 MG 12 hr tablet Wellbutrin SR 100 mg tablet, 12 hr sustained-release  Take 1 tablet every day by oral route.    [provider]  buPROPion HCl (WELLBUTRIN PO) Take by mouth.    [provider]  Cariprazine HCl (VRAYLAR) 1.5 MG CAPS Take 1 capsule by mouth daily.    [provider]  linaclotide (LINZESS) 145 MCG CAPS capsule Take 145 mcg by mouth daily as needed (constipation).    [provider]  nicotine (NICODERM CQ - DOSED IN MG/24 HOURS) 14 mg/24hr patch Place 14 mg onto the skin daily as needed (smoking cessation).    [provider]  predniSONE (DELTASONE) 10 MG tablet Take 1 tablet (10 mg total) by mouth daily. 08/12/18   Vickki Hearing, MD  prochlorperazine (COMPAZINE) 5 MG tablet Take 1 tablet (5 mg total) by mouth every 6 (six) hours as needed for nausea or vomiting. May cause sedation. Patient not taking: Reported on 07/22/2018 03/28/17   Anson Fret, MD  promethazine (PHENERGAN) 25 MG tablet Take 1 tablet (25 mg total) by mouth every 6 (six) hours as needed for nausea or vomiting. 08/14/18   Dakai Braithwaite, PA-C  tiZANidine (ZANAFLEX) 4 MG tablet Take 1 tablet (4 mg total) by mouth every 6 (six) hours as needed for muscle spasms. May also take for headache/migraine.Watch for sedation. Patient not taking: Reported on 07/22/2018 03/28/17   Anson Fret, MD  topiramate (TOPAMAX) 25 MG tablet Take 4 tablets (100 mg total) by mouth at bedtime. Patient not taking: Reported on 07/22/2018 03/24/17   Anson Fret, MD    Family History Family History  Problem Relation Age of Onset  . Anesthesia problems Father        hard to wake up post-op  . Stroke Father     Social History Social History   Tobacco Use  . Smoking status: Current Every Day Smoker    Packs/day: 1.00    Years: 10.00    Pack years: 10.00    Types: Cigarettes  .  Smokeless tobacco: Never Used  . Tobacco comment: quit 2 months ago  Substance Use Topics  . Alcohol use: No    Comment: social  . Drug use: No     Allergies   Dilaudid [hydromorphone hcl]; Adhesive [tape]; and Morphine and related   Review of Systems Review of Systems  Constitutional: Negative for appetite change, chills and fever.  Respiratory: Negative for shortness of breath.   Cardiovascular: Negative for chest pain and leg swelling.  Gastrointestinal: Positive for nausea and vomiting. Negative for abdominal pain, blood in stool and diarrhea.  Genitourinary: Negative for decreased urine volume, difficulty urinating and dysuria.  Musculoskeletal: Negative for back pain.  Right ankle pain.  Skin: Negative for color change and rash.  Neurological: Negative for dizziness, weakness and numbness.  Hematological: Negative for adenopathy.     Physical Exam Updated Vital Signs BP (!) 123/58 (BP Location: Left Arm)   Pulse 77   Temp 98.3 F (36.8 C) (Oral)   Resp 18   Ht 5\' 7"  (1.702 m)   Wt 108.9 kg   LMP 08/18/2012   SpO2 99%   BMI 37.59 kg/m   Physical Exam Vitals signs and nursing note reviewed.  Constitutional:      General: She is not in acute distress.    Appearance: She is well-developed.  HENT:     Head: Normocephalic and atraumatic.  Cardiovascular:     Rate and Rhythm: Normal rate and regular rhythm.     Heart sounds: Normal heart sounds.  Pulmonary:     Effort: Pulmonary effort is normal.     Breath sounds: Normal breath sounds.  Musculoskeletal:        General: Tenderness present. No swelling or deformity.     Comments: Tenderness to palpation over the posterior calcaneus and lateral Achilles tendon.  No excessive warmth, erythema, or edema noted.  No laxity or step-off deformity of the Achilles tendon noted. compartments of the lower extremity are soft  Skin:    General: Skin is warm.     Capillary Refill: Capillary refill takes less than 2  seconds.     Findings: No erythema or rash.  Neurological:     Mental Status: She is alert. Mental status is at baseline.     Sensory: No sensory deficit.     Motor: No weakness or abnormal muscle tone.      ED Treatments / Results  Labs (all labs ordered are listed, but only abnormal results are displayed) Labs Reviewed  CBC WITH DIFFERENTIAL/PLATELET - Abnormal; Notable for the following components:      Result Value   Hemoglobin 15.3 (*)    HCT 46.8 (*)    All other components within normal limits  BASIC METABOLIC PANEL    EKG None  Radiology No results found.  Procedures Procedures (including critical care time)  Medications Ordered in ED Medications  ondansetron (ZOFRAN-ODT) 4 MG disintegrating tablet (has no administration in time range)  ondansetron (ZOFRAN-ODT) disintegrating tablet 4 mg (4 mg Oral Given 08/14/18 0955)  sodium chloride 0.9 % bolus 1,000 mL (0 mLs Intravenous Stopped 08/14/18 1208)  metoCLOPramide (REGLAN) injection 10 mg (10 mg Intravenous Given 08/14/18 1104)  ketorolac (TORADOL) 30 MG/ML injection 30 mg (30 mg Intravenous Given 08/14/18 1246)     Initial Impression / Assessment and Plan / ED Course  I have reviewed the triage vital signs and the nursing notes.  Pertinent labs & imaging results that were available during my care of the patient were reviewed by me and considered in my medical decision making (see chart for details).     Patient seen here yesterday for same ankle pain but returns today due to vomiting.  She is tearful and nontoxic-appearing. Clinically, she does not appear dehydrated.  I will check labs and give IV fluids and antiemetic.  On recheck, patient reports feeling much better after IV fluids and Reglan.  Toradol given for ankle pain.  Extremity is neurovascularly intact, I do not suspect septic joint.  No concerning symptoms for cellulitis.  I feel she is appropriate for discharge home prescription written for Phenergan  for her vomiting.  I have also provided referral  information for podiatry at patient's request.  Final Clinical Impressions(s) / ED Diagnoses   Final diagnoses:  Non-intractable vomiting with nausea, unspecified vomiting type  Acute right ankle pain    ED Discharge Orders         Ordered    promethazine (PHENERGAN) 25 MG tablet  Every 6 hours PRN     08/14/18 1215           Pauline Ausriplett, Colburn Asper, PA-C 08/14/18 1353    Blane OharaZavitz, Joshua, MD 08/17/18 2327

## 2018-08-14 NOTE — Discharge Instructions (Signed)
Small, frequent sips of fluids until you are feeling better.  You may try contacting [facility] [address], [place] [address] phone number is [phone] to arrange follow-up regarding her ankle pain.

## 2018-08-14 NOTE — ED Notes (Signed)
Patient given discharge instruction, verbalized understand. IV removed, band aid applied. Patient ambulatory out of the department.  

## 2018-08-14 NOTE — ED Triage Notes (Signed)
Vomiting and rt ankle pain since yesterday.  Seen here yesterday for same.

## 2018-08-14 NOTE — ED Notes (Signed)
Lab at the bedside 

## 2018-08-14 NOTE — Telephone Encounter (Signed)
Patient called again regarding having a reaction to the injection. She states she can not stop vomiting. I have advised her vomiting is not from the injection, if she can not stop vomiting she should return to the emergency room or contact her primary care. She states the vomiting is from the neuritis. I advised again I have not heard of this, but will send message to Dr Romeo Apple  She is requesting to be out of work for vomiting and neuritis.   I have advised her I will send message to Dr Romeo Apple. She is very upset, yelling at me on the phone.

## 2018-08-17 NOTE — Telephone Encounter (Signed)
Patient called this morning to ask about obtaining her medical records, the process of which has been relayed. I relayed it appears that messages have been forwarded to Dr Romeo Apple. Patient states feels, although she is very pleased with Dr Romeo Apple, that "best to part ways".  York Spaniel will try to come by later today to sign medical records release form.

## 2018-08-24 ENCOUNTER — Encounter: Payer: Self-pay | Admitting: Podiatry

## 2018-08-24 ENCOUNTER — Ambulatory Visit (INDEPENDENT_AMBULATORY_CARE_PROVIDER_SITE_OTHER): Payer: Medicaid Other

## 2018-08-24 ENCOUNTER — Ambulatory Visit: Payer: Medicaid Other | Admitting: Podiatry

## 2018-08-24 DIAGNOSIS — M7661 Achilles tendinitis, right leg: Secondary | ICD-10-CM | POA: Diagnosis not present

## 2018-08-24 DIAGNOSIS — S93401A Sprain of unspecified ligament of right ankle, initial encounter: Secondary | ICD-10-CM

## 2018-08-24 DIAGNOSIS — R600 Localized edema: Secondary | ICD-10-CM | POA: Diagnosis not present

## 2018-08-24 NOTE — Progress Notes (Signed)
   HPI: 33 year old female presents the office today as a new patient for evaluation regarding chronic pain to the right ankle.  Currently the pain is 9/10 on a pain scale.  Patient reports that on 06/03/2017 she fell and twisted her right ankle.  Patient was evaluated and treated which were negative for x-rays.  She was placed in a immobilization cam boot and given anti-inflammatories.  Patient did not improve and she has since received anti-inflammatory injections which caused her to go to the hospital due to side effects.  A heel lift in the boot also exacerbated her pain.  Pain is been constant since the date of injury and all conservative measures have been unsuccessful in providing any sort of satisfactory alleviation of symptoms.  She presents today for further treatment evaluation  Past Medical History:  Diagnosis Date  . Anxiety   . 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 Bipolar disorder (HCC)   . Cervicalgia   . Childhood asthma    seasonal with allergies  . Complication of anesthesia    hard to wake up post-op  . Depression   . Family history of adverse reaction to anesthesia    pt's father has hx. of being hard to wake up post-op  . Headache   . History of Chiari malformation   . History of MRSA infection    axilla  . 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 Panic attacks   . PTSD (post-traumatic stress disorder)   . Shoulder dislocation 06/2015   right     Physical Exam: General: The patient is alert and oriented x3 in no acute distress.  Dermatology: Skin is warm, dry and supple bilateral lower extremities. Negative for open lesions or macerations.  Vascular: Palpable pedal pulses bilaterally. Capillary refill within normal limits.  Mild edema noted  Neurological: Epicritic and protective threshold grossly intact bilaterally.   Musculoskeletal Exam: Range of motion within normal limits to all pedal and ankle joints  bilateral. Muscle strength 5/5 in all groups bilateral.  Significant pain on palpation noted to the right ankle joint.  Assessment: 1.  Severe right ankle sprain-chronic DOI: 06/03/2017   Plan of Care:  1. Patient evaluated. X-Rays reviewed.  2.  Today we can order MRI right ankle.  Conservative modalities have been completely unsuccessful in providing any sort of satisfactory alleviation of symptoms for the patient. 3.  Continue weightbearing in the cam boot as tolerated 4.  Prescription for tramadol 50 mg #60 every 8 hours 5.  Return to clinic in 3 weeks to review MRI results      Felecia Shelling, DPM [facility]  Dr. Felecia Shelling, DPM    [address] [place], [place] [address]                Office [phone]  Fax [phone]

## 2018-08-24 NOTE — Patient Instructions (Signed)
Achilles Tendinitis  Achilles tendinitis is inflammation of the tough, cord-like band that attaches the lower leg muscles to the heel bone (Achilles tendon). This is usually caused by overusing the tendon and the ankle joint. Achilles tendinitis usually gets better over time with treatment and caring for yourself at home. It can take weeks or months to heal completely. What are the causes? This condition may be caused by:  A sudden increase in exercise or activity, such as running.  Doing the same exercises or activities (such as jumping) over and over.  Not warming up calf muscles before exercising.  Exercising in shoes that are worn out or not made for exercise.  Having arthritis or a bone growth (spur) on the back of the heel bone. This can rub against the tendon and hurt it.  Age-related wear and tear. Tendons become less flexible with age and more likely to be injured. What are the signs or symptoms? Common symptoms of this condition include:  Pain in the Achilles tendon or in the back of the leg, just above the heel. The pain usually gets worse with exercise.  Stiffness or soreness in the back of the leg, especially in the morning.  Swelling of the skin over the Achilles tendon.  Thickening of the tendon.  Bone spurs at the bottom of the Achilles tendon, near the heel.  Trouble standing on tiptoe. How is this diagnosed? This condition is diagnosed based on your symptoms and a physical exam. You may have tests, including:  X-rays.  MRI. How is this treated? The goal of treatment is to relieve symptoms and help your injury heal. Treatment may include:  Decreasing or stopping activities that caused the tendinitis. This may mean switching to low-impact exercises like biking or swimming.  Icing the injured area.  Doing physical therapy, including strengthening and stretching exercises.  NSAIDs to help relieve pain and swelling.  Using supportive shoes, wraps, heel  lifts, or a walking boot (air cast).  Surgery. This may be done if your symptoms do not improve after 6 months.  Using high-energy shock wave impulses to stimulate the healing process (extracorporeal shock wave therapy). This is rare.  Injection of medicines to help relieve inflammation (corticosteroids). This is rare. Follow these instructions at home: If you have an air cast:  Wear the cast as told by your health care provider. Remove it only as told by your health care provider.  Loosen the cast if your toes tingle, become numb, or turn cold and blue. Activity  Gradually return to your normal activities once your health care provider approves. Do not do activities that cause pain. ? Consider doing low-impact exercises, like cycling or swimming.  If you have an air cast, ask your health care provider when it is safe for you to drive.  If physical therapy was prescribed, do exercises as told by your health care provider or physical therapist. Managing pain, stiffness, and swelling   Raise (elevate) your foot above the level of your heart while you are sitting or lying down.  Move your toes often to avoid stiffness and to lessen swelling.  If directed, put ice on the injured area: ? Put ice in a plastic bag. ? Place a towel between your skin and the bag. ? Leave the ice on for 20 minutes, 2-3 times a day General instructions  If directed, wrap your foot with an elastic bandage or other wrap. This can help keep your tendon from moving too much while it   heals. Your health care provider will show you how to wrap your foot correctly.  Wear supportive shoes or heel lifts only as told by your health care provider.  Take over-the-counter and prescription medicines only as told by your health care provider.  Keep all follow-up visits as told by your health care provider. This is important. Contact a health care provider if:  You have symptoms that gets worse.  You have pain that  does not get better with medicine.  You develop new, unexplained symptoms.  You develop warmth and swelling in your foot.  You have a fever. Get help right away if:  You have a sudden popping sound or sensation in your Achilles tendon followed by severe pain.  You cannot move your toes or foot.  You cannot put any weight on your foot. Summary  Achilles tendinitis is inflammation of the tough, cord-like band that attaches the lower leg muscles to the heel bone (Achilles tendon).  This condition is usually caused by overusing the tendon and the ankle joint. It can also be caused by arthritis or normal aging.  The most common symptoms of this condition include pain, swelling, or stiffness in the Achilles tendon or in the back of the leg.  This condition is usually treated with rest, NSAIDs, and physical therapy. This information is not intended to replace advice given to you by your health care provider. Make sure you discuss any questions you have with your health care provider. Document Released: 03/27/2005 Document Revised: 05/06/2016 Document Reviewed: 05/06/2016 Elsevier Interactive Patient Education  2019 Elsevier Inc.   

## 2018-08-25 ENCOUNTER — Telehealth: Payer: Self-pay | Admitting: *Deleted

## 2018-08-25 DIAGNOSIS — S93401A Sprain of unspecified ligament of right ankle, initial encounter: Secondary | ICD-10-CM

## 2018-08-25 DIAGNOSIS — R600 Localized edema: Secondary | ICD-10-CM

## 2018-08-25 NOTE — Telephone Encounter (Signed)
-----   Message from Felecia Shelling, [place] sent at 08/24/2018  7:05 PM EST ----- Regarding: MRI right ankle Please order MRI right ankle without contrast.  Diagnosis: Severe right ankle sprain x2 months  Note dictated.  Thanks, Dr. Logan Bores

## 2018-08-25 NOTE — Telephone Encounter (Signed)
[facility] - Medicaid required clinicals to review prior to pre-cert [id number] MRI right ankle, Service order:  [id number]. Faxed orders, clinicals and demographics to [facility].

## 2018-09-02 ENCOUNTER — Ambulatory Visit: Payer: Medicaid Other | Admitting: Orthopedic Surgery

## 2018-09-04 ENCOUNTER — Ambulatory Visit
Admission: RE | Admit: 2018-09-04 | Discharge: 2018-09-04 | Disposition: A | Discharge status: Home / Self Care | Payer: Medicaid Other | Source: Ambulatory Visit | Attending: Podiatry | Admitting: Podiatry

## 2018-09-04 DIAGNOSIS — R600 Localized edema: Secondary | ICD-10-CM

## 2018-09-04 DIAGNOSIS — S93401A Sprain of unspecified ligament of right ankle, initial encounter: Secondary | ICD-10-CM

## 2018-09-09 ENCOUNTER — Other Ambulatory Visit: Payer: Self-pay

## 2018-09-09 ENCOUNTER — Ambulatory Visit: Payer: Medicaid Other | Admitting: Podiatry

## 2018-09-09 DIAGNOSIS — S93411A Sprain of calcaneofibular ligament of right ankle, initial encounter: Secondary | ICD-10-CM | POA: Diagnosis not present

## 2018-09-09 DIAGNOSIS — S93401A Sprain of unspecified ligament of right ankle, initial encounter: Secondary | ICD-10-CM

## 2018-09-09 DIAGNOSIS — R600 Localized edema: Secondary | ICD-10-CM

## 2018-09-10 ENCOUNTER — Telehealth: Payer: Self-pay | Admitting: *Deleted

## 2018-09-10 DIAGNOSIS — S93401A Sprain of unspecified ligament of right ankle, initial encounter: Secondary | ICD-10-CM

## 2018-09-10 DIAGNOSIS — R600 Localized edema: Secondary | ICD-10-CM

## 2018-09-10 NOTE — Telephone Encounter (Signed)
[facility] - [name] states pt has Medicaid and is not accepted by [facility]. Referral sent to [facility].

## 2018-09-14 ENCOUNTER — Ambulatory Visit: Payer: Medicaid Other | Admitting: Podiatry

## 2018-09-14 NOTE — Progress Notes (Signed)
   HPI: 33 year old female presents the office today for follow up evaluation of a right ankle sprain. She states her pain has not changed since her last visit. She reports the ankle "locks up" now which causes significant pain. She has been wearing the CAM boot as directed but denies taking Tramadol for pain. Walking and being on her feet increases the pain. Patient is here for further evaluation and treatment.   Past Medical History:  Diagnosis Date  . Anxiety   . 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 Bipolar disorder (HCC)   . Cervicalgia   . Childhood asthma    seasonal with allergies  . Complication of anesthesia    hard to wake up post-op  . Depression   . Family history of adverse reaction to anesthesia    pt's father has hx. of being hard to wake up post-op  . Headache   . History of Chiari malformation   . History of MRSA infection    axilla  . 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 Panic attacks   . PTSD (post-traumatic stress disorder)   . Shoulder dislocation 06/2015   right     Physical Exam: General: The patient is alert and oriented x3 in no acute distress.  Dermatology: Skin is warm, dry and supple bilateral lower extremities. Negative for open lesions or macerations.  Vascular: Palpable pedal pulses bilaterally. Capillary refill within normal limits.  Mild edema noted  Neurological: Epicritic and protective threshold grossly intact bilaterally.   Musculoskeletal Exam: Range of motion within normal limits to all pedal and ankle joints bilateral. Muscle strength 5/5 in all groups bilateral.  Significant pain on palpation noted to the right ankle joint.  MRI Impression:  Normal MRI of the right ankle.  Assessment: 1.  Severe right ankle sprain-chronic DOI: 06/03/2017 - unchanged    Plan of Care:  1. Patient evaluated. MRI reviewed.  2. Orders for physical therapy placed for twice a week for four weeks.   3. Compression anklet dispensed.  4. Patient does not want to wear the CAM boot anymore. Recommended good shoe gear.  5. Return to clinic in 6 weeks.      Felecia Shelling, DPM [facility]  Dr. Felecia Shelling, DPM    [address] [place], [place] [address]                Office [phone]  Fax [phone]

## 2018-10-21 ENCOUNTER — Ambulatory Visit: Payer: Medicaid Other | Admitting: Podiatry

## 2018-12-14 ENCOUNTER — Ambulatory Visit (HOSPITAL_COMMUNITY)
Admission: RE | Admit: 2018-12-14 | Discharge: 2018-12-14 | Disposition: A | Discharge status: Home / Self Care | Payer: Medicaid Other | Source: Ambulatory Visit | Attending: Nurse Practitioner | Admitting: Nurse Practitioner

## 2018-12-14 ENCOUNTER — Other Ambulatory Visit: Payer: Self-pay

## 2018-12-14 ENCOUNTER — Other Ambulatory Visit (HOSPITAL_COMMUNITY): Payer: Self-pay | Admitting: Nurse Practitioner

## 2018-12-14 DIAGNOSIS — R1010 Upper abdominal pain, unspecified: Secondary | ICD-10-CM | POA: Diagnosis not present

## 2018-12-16 ENCOUNTER — Emergency Department (HOSPITAL_COMMUNITY): Payer: Medicaid Other

## 2018-12-16 ENCOUNTER — Emergency Department (HOSPITAL_COMMUNITY)
Admission: EM | Admit: 2018-12-16 | Discharge: 2018-12-16 | Disposition: A | Discharge status: Home / Self Care | Payer: Medicaid Other | Attending: Emergency Medicine | Admitting: Emergency Medicine

## 2018-12-16 ENCOUNTER — Other Ambulatory Visit: Payer: Self-pay

## 2018-12-16 ENCOUNTER — Encounter (HOSPITAL_COMMUNITY): Payer: Self-pay

## 2018-12-16 DIAGNOSIS — J45909 Unspecified asthma, uncomplicated: Secondary | ICD-10-CM | POA: Diagnosis not present

## 2018-12-16 DIAGNOSIS — Z79899 Other long term (current) drug therapy: Secondary | ICD-10-CM | POA: Insufficient documentation

## 2018-12-16 DIAGNOSIS — F17210 Nicotine dependence, cigarettes, uncomplicated: Secondary | ICD-10-CM | POA: Diagnosis not present

## 2018-12-16 DIAGNOSIS — R0789 Other chest pain: Secondary | ICD-10-CM | POA: Insufficient documentation

## 2018-12-16 DIAGNOSIS — R1012 Left upper quadrant pain: Secondary | ICD-10-CM | POA: Diagnosis not present

## 2018-12-16 DIAGNOSIS — R0602 Shortness of breath: Secondary | ICD-10-CM | POA: Diagnosis present

## 2018-12-16 LAB — COMPREHENSIVE METABOLIC PANEL
ALT: 16 U/L (ref 0–44)
AST: 15 U/L (ref 15–41)
Albumin: 4 g/dL (ref 3.5–5.0)
Alkaline Phosphatase: 58 U/L (ref 38–126)
Anion gap: 12 (ref 5–15)
BUN: 6 mg/dL (ref 6–20)
CO2: 20 mmol/L — ABNORMAL LOW (ref 22–32)
Calcium: 9.1 mg/dL (ref 8.9–10.3)
Chloride: 107 mmol/L (ref 98–111)
Creatinine, Ser: 0.85 mg/dL (ref 0.44–1.00)
GFR calc Af Amer: >60 mL/min (ref >60)
GFR calc non Af Amer: >60 mL/min (ref >60)
Glucose, Bld: 89 mg/dL (ref 70–99)
Potassium: 3.9 mmol/L (ref 3.5–5.1)
Sodium: 139 mmol/L (ref 135–145)
Total Bilirubin: 0.6 mg/dL (ref 0.3–1.2)
Total Protein: 6.7 g/dL (ref 6.5–8.1)

## 2018-12-16 LAB — CBC WITH DIFFERENTIAL/PLATELET
Abs Immature Granulocytes: 0.01 10*3/uL (ref 0.00–0.07)
Basophils Absolute: 0 10*3/uL (ref 0.0–0.1)
Basophils Relative: 0 %
Eosinophils Absolute: 0.1 10*3/uL (ref 0.0–0.5)
Eosinophils Relative: 1 %
HCT: 44.9 % (ref 36.0–46.0)
Hemoglobin: 15.1 g/dL — ABNORMAL HIGH (ref 12.0–15.0)
Immature Granulocytes: 0 %
Lymphocytes Relative: 30 %
Lymphs Abs: 1.9 10*3/uL (ref 0.7–4.0)
MCH: 31.9 pg (ref 26.0–34.0)
MCHC: 33.6 g/dL (ref 30.0–36.0)
MCV: 94.7 fL (ref 80.0–100.0)
Monocytes Absolute: 0.3 10*3/uL (ref 0.1–1.0)
Monocytes Relative: 5 %
Neutro Abs: 3.9 10*3/uL (ref 1.7–7.7)
Neutrophils Relative %: 64 %
Platelets: 228 10*3/uL (ref 150–400)
RBC: 4.74 MIL/uL (ref 3.87–5.11)
RDW: 12 % (ref 11.5–15.5)
WBC: 6.2 10*3/uL (ref 4.0–10.5)
nRBC: 0 % (ref 0.0–0.2)

## 2018-12-16 LAB — LIPASE, BLOOD: Lipase: 28 U/L (ref 11–51)

## 2018-12-16 MED ORDER — IOHEXOL 300 MG/ML  SOLN
100.0000 mL | Freq: Once | INTRAMUSCULAR | Status: AC | PRN
  Administered 2018-12-16: 100 mL via INTRAVENOUS

## 2018-12-16 MED ORDER — FAMOTIDINE 20 MG PO TABS: 20.0000 mg | ORAL_TABLET | Freq: Two times a day (BID) | ORAL | 0 refills | Status: DC

## 2018-12-16 MED ORDER — ONDANSETRON HCL 4 MG/2ML IJ SOLN
4.0000 mg | Freq: Once | INTRAMUSCULAR | Status: AC
  Administered 2018-12-16: 4 mg via INTRAVENOUS
  Filled 2018-12-16: qty 2

## 2018-12-16 MED ORDER — SODIUM CHLORIDE 0.9 % IV SOLN
INTRAVENOUS | Status: DC
  Administered 2018-12-16: 10:00:00 via INTRAVENOUS

## 2018-12-16 NOTE — ED Provider Notes (Signed)
[facility] EMERGENCY DEPARTMENT Provider Note   CSN: [id number] Arrival date & time: 12/16/18  1696     History   Chief Complaint Chief Complaint  Patient presents with   Abdominal Pain   Shortness of Breath    HPI [name] is a 33 y.o. female.     Patient presenting with the complaint of left upper quadrant abdominal pain.  Started on Friday.  Associated with diarrhea on Friday only.  Patient saw Dr. Nevada Crane her primary care doctor yesterday and started on Cipro and Flagyl.  This morning patient with a complaint of feeling like something is stuck in the back of her throat she has some irritation there kind of points to her upper anterior chest area.  Patient able to swallow fine and able to drink liquids and take food without any problems.  Patient denies any chest pain or shortness of breath or fevers no upper respiratory symptoms.  No blood in the bowel movements.     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 of being hard to wake up post-op   Headache    History of Chiari malformation    History of MRSA infection    axilla   Laryngotracheitis 06/20/2015   started antibiotic 06/20/2015   Panic attacks    PTSD (post-traumatic stress disorder)    Shoulder dislocation 06/2015   right    Patient Active Problem List   Diagnosis Date Noted   Intractable chronic migraine without aura and with status migrainosus 03/24/2017   Acute appendicitis    Chiari I malformation ([place]) 08/08/2016   Endometriosis 09/16/2012   CERVICALGIA 09/08/2007   CERVICAL SPASM 09/08/2007    Past Surgical History:  Procedure Laterality Date   BRAIN SURGERY      CESAREAN SECTION  09/05/2008   CYSTOSCOPY N/A 09/15/2012   Procedure: CYSTOSCOPY;  Surgeon: Shon Millet II, MD;  Location: [facility];  Service: Gynecology;  Laterality: N/A;   DILATION AND EVACUATION  04/15/2007   HYSTEROSCOPY W/D&C  03/31/2012   Procedure: DILATATION AND CURETTAGE [place];  Surgeon: Allena Katz, MD;  Location: [facility];  Service: Gynecology;  Laterality: N/A;   LAPAROSCOPIC APPENDECTOMY N/A 10/29/2016   Procedure: APPENDECTOMY LAPAROSCOPIC;  Surgeon: Aviva Signs, MD;  Location: [facility];  Service: General;  Laterality: N/A;   LAPAROSCOPIC ASSISTED VAGINAL HYSTERECTOMY N/A 09/15/2012   Procedure: LAPAROSCOPIC ASSISTED VAGINAL HYSTERECTOMY;  Surgeon: Allena Katz, MD;  Location: [facility];  Service: Gynecology;  Laterality: N/A;   LAPAROSCOPY  03/31/2012   Procedure: LAPAROSCOPY OPERATIVE;  Surgeon: Allena Katz, MD;  Location: [facility];  Service: Gynecology;  Laterality: N/A;  with Biopsies of Bilateral Fallopian Tubes; Fulguration of Endometriosis   SHOULDER ARTHROSCOPY WITH CAPSULORRHAPHY Right 06/28/2015   Procedure: RIGHT SHOULDER ARTHROSCOPY WITH CAPSULORRHAPHY;  Surgeon: Earlie Server, MD;  Location: [facility];  Service: Orthopedics;  Laterality: Right;   SUBOCCIPITAL CRANIECTOMY CERVICAL LAMINECTOMY N/A 08/08/2016   Procedure: SUBOCCIPITAL CRANIECTOMY CERVICAL LAMINECTOMY NUMBER DURAPLASTY;  Surgeon: Newman Pies, MD;  Location: [facility];  Service: Neurosurgery;  Laterality: N/A;  suboccipital  ULNAR NERVE REPAIR Right 05/2015     OB History    Gravida  2   Para  1   Term  1   Preterm      AB  1   Living  1     SAB  1   TAB      Ectopic      Multiple      Live Births               Home Medications    Prior to Admission medications   Medication Sig Start Date End Date Taking? Authorizing Provider  amphetamine-dextroamphetamine (ADDERALL) 20 MG tablet Take 20 mg by mouth 2 (two) times daily.   Yes  [provider]  buPROPion (WELLBUTRIN SR) 100 MG 12 hr tablet Wellbutrin SR 100 mg tablet, 12 hr sustained-release  Take 1 tablet every day by oral route.   Yes [provider]  ciprofloxacin (CIPRO) 500 MG tablet Take 500 mg by mouth 2 (two) times daily.   Yes [provider]  dicyclomine (BENTYL) 10 MG capsule Take 10 mg by mouth 4 (four) times daily -  before meals and at bedtime.   Yes [provider]  metroNIDAZOLE (FLAGYL) 250 MG tablet Take 250 mg by mouth 3 (three) times daily.   Yes [provider]  famotidine (PEPCID) 20 MG tablet Take 1 tablet (20 mg total) by mouth 2 (two) times daily. 12/16/18   Vanetta MuldersZackowski, Renella Steig, MD    Family History Family History  Problem Relation Age of Onset   Anesthesia problems Father        hard to wake up post-op   Stroke Father     Social History Social History   Tobacco Use   Smoking status: Current Every Day Smoker    Packs/day: 1.00    Years: 10.00    Pack years: 10.00    Types: Cigarettes   Smokeless tobacco: Never Used   Tobacco comment: quit 2 months ago  Substance Use Topics   Alcohol use: No    Comment: social   Drug use: No     Allergies   Dilaudid [hydromorphone hcl], Adhesive [tape], and Morphine and related   Review of Systems Review of Systems  Constitutional: Negative for chills and fever.  HENT: Negative for congestion, rhinorrhea, sore throat, trouble swallowing and voice change.   Eyes: Negative for visual disturbance.  Respiratory: Negative for cough and shortness of breath.   Cardiovascular: Positive for chest pain. Negative for leg swelling.  Gastrointestinal: Positive for abdominal pain and diarrhea. Negative for blood in stool, nausea and vomiting.  Genitourinary: Negative for dysuria.  Musculoskeletal: Negative for back pain and neck pain.  Skin: Negative for rash.  Neurological: Negative for dizziness, light-headedness and headaches.  Hematological:  Does not bruise/bleed easily.  Psychiatric/Behavioral: Negative for confusion.     Physical Exam Updated Vital Signs BP 118/76    Pulse 69    Temp 98.3 F (36.8 C) (Oral)    Resp 19    Ht 1.702 m (5\' 7" )    Wt 102.1 kg    LMP 08/18/2012    SpO2 100%    BMI 35.24 kg/m   Physical Exam Vitals signs and nursing note reviewed.  Constitutional:      General: She is not in acute distress.    Appearance: Normal appearance. She is well-developed.  HENT:     Head: Normocephalic and atraumatic.  Eyes:     Extraocular Movements: Extraocular  movements intact.     Conjunctiva/sclera: Conjunctivae normal.     Pupils: Pupils are equal, round, and reactive to light.  Neck:     Musculoskeletal: Normal range of motion and neck supple.  Cardiovascular:     Rate and Rhythm: Normal rate and regular rhythm.     Heart sounds: No murmur.  Pulmonary:     Effort: Pulmonary effort is normal. No respiratory distress.     Breath sounds: Normal breath sounds.  Abdominal:     Palpations: Abdomen is soft.     Tenderness: There is no abdominal tenderness.  Musculoskeletal: Normal range of motion.        General: No swelling.  Skin:    General: Skin is warm and dry.     Capillary Refill: Capillary refill takes less than 2 seconds.  Neurological:     General: No focal deficit present.     Mental Status: She is alert and oriented to person, place, and time.      ED Treatments / Results  Labs (all labs ordered are listed, but only abnormal results are displayed) Labs Reviewed  COMPREHENSIVE METABOLIC PANEL - Abnormal; Notable for the following components:      Result Value   CO2 20 (*)    All other components within normal limits  CBC WITH DIFFERENTIAL/PLATELET - Abnormal; Notable for the following components:   Hemoglobin 15.1 (*)    All other components within normal limits  LIPASE, BLOOD    EKG EKG Interpretation  Date/Time:  Wednesday December 16 2018 08:25:36 EDT Ventricular Rate:  81 PR  Interval:    QRS Duration: 74 QT Interval:  365 QTC Calculation: 424 R Axis:   87 Text Interpretation:  Sinus rhythm ST elev, probable normal early repol pattern Baseline wander in lead(s) III No significant change since last tracing Confirmed by Vanetta MuldersZackowski, Annalaura Sauseda [phone]([id number]) on 12/16/2018 8:30:06 AM   Radiology Dg Chest 2 View  Result Date: 12/16/2018 CLINICAL DATA:  Patient diagnosed with colitis and gastritis. Shortness of breath. EXAM: CHEST - 2 VIEW COMPARISON:  March 09, 2016 FINDINGS: The heart size and mediastinal contours are within normal limits. Both lungs are clear. The visualized skeletal structures are unremarkable. IMPRESSION: No active cardiopulmonary disease. Electronically Signed   By: Gerome Samavid  Williams III M.D   On: 12/16/2018 11:42   Ct Abdomen Pelvis W Contrast  Result Date: 12/16/2018 CLINICAL DATA:  Recent diagnosis of colitis and gastritis. Abdominal pain. Suspected diverticulitis. EXAM: CT ABDOMEN AND PELVIS WITH CONTRAST TECHNIQUE: Multidetector CT imaging of the abdomen and pelvis was performed using the standard protocol following bolus administration of intravenous contrast. CONTRAST:  100mL OMNIPAQUE IOHEXOL 300 MG/ML  SOLN COMPARISON:  Radiography 12/14/2018.  CT 10/29/2016. FINDINGS: Lower chest: Linear scarring or atelectasis at both lung bases. Hepatobiliary: There are 4 low densities within the liver. At the dome of the liver, there is a 5 mm low density. In the lateral segment of the left lobe there is a 12 mm low density. In the posterior right lower lobe there is a 2 cm low-density. In the anterior right lobe there is a 12 mm low-density. These are nonspecific and could represent hemangiomas. They are similar to the study of 2018 and therefore hemangiomas are probable. Pancreas: Normal Spleen: Stable and benign 1 cm low-density in the upper portion of the spleen. Adrenals/Urinary Tract: Adrenal glands are normal. The right kidney is normal. The left kidney contains a 4  mm nonobstructing stone in the midportion. No hydronephrosis. The  bladder is normal. Stomach/Bowel: No acute bowel pathology. Previous appendectomy. Minimal diverticulosis of the sigmoid region but no evidence of diverticulitis. Vascular/Lymphatic: Early atherosclerosis of the aorta. No aneurysm. IVC is normal. No retroperitoneal adenopathy. Reproductive: Previous hysterectomy. No pelvic mass. Functional ovarian cysts. Other: No free fluid or air. Musculoskeletal: Normal IMPRESSION: No evidence of diverticulitis.  Minimal sigmoid diverticulosis. Previous appendectomy and hysterectomy. Stable low densities within the liver likely representing hemangiomas. Early atherosclerotic change of the aorta 4 mm nonobstructing stone in the midportion of the left kidney. Electronically Signed   By: Paulina FusiMark  Shogry [name]   On: 12/16/2018 11:25   Dg Abd 2 Views  Result Date: 12/15/2018 CLINICAL DATA:  Upper abdominal pain EXAM: ABDOMEN - 2 VIEW COMPARISON:  CT abdomen pelvis 10/29/2016 FINDINGS: The bowel gas pattern is normal. There is no evidence of free air. No radio-opaque calculi or other significant radiographic abnormality is seen. IMPRESSION: Negative. Electronically Signed   By: Marlan Palauharles  Clark [name]   On: 12/15/2018 09:59    Procedures Procedures (including critical care time)  Medications Ordered in ED Medications  0.9 %  sodium chloride infusion ( Intravenous New Bag/Given 12/16/18 1000)  ondansetron (ZOFRAN) injection 4 mg (4 mg Intravenous Given 12/16/18 1000)  iohexol (OMNIPAQUE) 300 MG/ML solution 100 mL (100 mLs Intravenous Contrast Given 12/16/18 1051)     Initial Impression / Assessment and Plan / ED Course  I have reviewed the triage vital signs and the nursing notes.  Pertinent labs & imaging results that were available during my care of the patient were reviewed by me and considered in my medical decision making (see chart for details).       Patient's main complaint left lower quadrant  abdominal pain CT scan of the abdomen without evidence of diverticulitis.  There is some sigmoid diverticulosis nothing to explain the pain in left upper quadrant and less is related to peptic ulcer disease.  Will put patient on a course of Pepcid.  Patient does not need to continue the antibiotics.  For the discomfort in the upper throat area this could certainly could been secondary to 1 of the pills causing irritation.  No evidence of any significant obstruction.  Does not seem to be cardiac in nature.  EKG without acute changes.  And patient's pain is positional in nature.  Very possible the Pepcid will help this as well.  Patient to follow back up with primary care doctor return for any new or worse symptoms.   Final Clinical Impressions(s) / ED Diagnoses   Final diagnoses:  Left upper quadrant pain  Atypical chest pain    ED Discharge Orders         Ordered    famotidine (PEPCID) 20 MG tablet  2 times daily     12/16/18 1233           Vanetta MuldersZackowski, Lotus Santillo, MD 12/16/18 1245

## 2018-12-16 NOTE — ED Triage Notes (Signed)
Pt reports was diagnosed with colitis and gastritis and started antibiotics yesterday.  THis morning c/o sob and feeling like something is in the back of her throat.  Denies cough, fever, or sob.  Reports diarrhea Friday but none since then.

## 2018-12-16 NOTE — Discharge Instructions (Addendum)
Work-up for the abdominal pain without any acute findings.  No reason to continue the Cipro and Flagyl antibiotic based on those study results.  Take the Pepcid as directed.  This may address that the discomfort in the chest area and may also actually help with the abdominal pain.  CT scans do not show peptic ulcer disease very well so that is a possibility if symptoms persist follow-up with your doctor.  Return for any new or worse symptoms.

## 2018-12-16 NOTE — ED Notes (Signed)
Pt refused DC vitals 

## 2018-12-16 NOTE — ED Notes (Signed)
Pt given two warm blankets.  

## 2018-12-21 ENCOUNTER — Encounter (HOSPITAL_COMMUNITY): Payer: Self-pay

## 2018-12-21 ENCOUNTER — Other Ambulatory Visit: Payer: Self-pay

## 2018-12-21 ENCOUNTER — Emergency Department (HOSPITAL_COMMUNITY)
Admission: EM | Admit: 2018-12-21 | Discharge: 2018-12-22 | Disposition: A | Discharge status: Home / Self Care | Payer: Medicaid Other | Attending: Emergency Medicine | Admitting: Emergency Medicine

## 2018-12-21 ENCOUNTER — Emergency Department (HOSPITAL_COMMUNITY): Payer: Medicaid Other

## 2018-12-21 DIAGNOSIS — Z5321 Procedure and treatment not carried out due to patient leaving prior to being seen by health care provider: Secondary | ICD-10-CM | POA: Diagnosis not present

## 2018-12-21 DIAGNOSIS — R079 Chest pain, unspecified: Secondary | ICD-10-CM | POA: Diagnosis not present

## 2018-12-21 LAB — CBC
HCT: 42.7 % (ref 36.0–46.0)
Hemoglobin: 14.4 g/dL (ref 12.0–15.0)
MCH: 32 pg (ref 26.0–34.0)
MCHC: 33.7 g/dL (ref 30.0–36.0)
MCV: 94.9 fL (ref 80.0–100.0)
Platelets: 248 10*3/uL (ref 150–400)
RBC: 4.5 MIL/uL (ref 3.87–5.11)
RDW: 11.9 % (ref 11.5–15.5)
WBC: 5.1 10*3/uL (ref 4.0–10.5)
nRBC: 0 % (ref 0.0–0.2)

## 2018-12-21 LAB — BASIC METABOLIC PANEL
Anion gap: 10 (ref 5–15)
BUN: 5 mg/dL — ABNORMAL LOW (ref 6–20)
CO2: 20 mmol/L — ABNORMAL LOW (ref 22–32)
Calcium: 9 mg/dL (ref 8.9–10.3)
Chloride: 106 mmol/L (ref 98–111)
Creatinine, Ser: 0.92 mg/dL (ref 0.44–1.00)
GFR calc Af Amer: >60 mL/min (ref >60)
GFR calc non Af Amer: >60 mL/min (ref >60)
Glucose, Bld: 87 mg/dL (ref 70–99)
Potassium: 3.7 mmol/L (ref 3.5–5.1)
Sodium: 136 mmol/L (ref 135–145)

## 2018-12-21 LAB — I-STAT BETA HCG BLOOD, ED (MC, WL, AP ONLY): I-stat hCG, quantitative: <5 m[IU]/mL (ref <5)

## 2018-12-21 LAB — TROPONIN I: Troponin I: <0.03 ng/mL (ref <0.03)

## 2018-12-21 MED ORDER — SODIUM CHLORIDE 0.9% FLUSH: 3.0000 mL | Freq: Once | INTRAVENOUS | Status: DC

## 2018-12-21 NOTE — ED Triage Notes (Signed)
Pt states she has been experience chest heaviness and SOB for about a week that has worsened since last night. Pt states she feels "like I have a lump in the base of my throat that is making me really winded".

## 2018-12-22 ENCOUNTER — Emergency Department (HOSPITAL_COMMUNITY): Payer: Medicaid Other

## 2018-12-22 ENCOUNTER — Emergency Department (HOSPITAL_COMMUNITY)
Admission: EM | Admit: 2018-12-22 | Discharge: 2018-12-22 | Disposition: A | Discharge status: Home / Self Care | Payer: Medicaid Other | Source: Home / Self Care | Attending: Emergency Medicine | Admitting: Emergency Medicine

## 2018-12-22 ENCOUNTER — Encounter (HOSPITAL_COMMUNITY): Payer: Self-pay | Admitting: *Deleted

## 2018-12-22 ENCOUNTER — Other Ambulatory Visit: Payer: Self-pay

## 2018-12-22 DIAGNOSIS — F17210 Nicotine dependence, cigarettes, uncomplicated: Secondary | ICD-10-CM | POA: Insufficient documentation

## 2018-12-22 DIAGNOSIS — K209 Esophagitis, unspecified without bleeding: Secondary | ICD-10-CM

## 2018-12-22 DIAGNOSIS — Z79899 Other long term (current) drug therapy: Secondary | ICD-10-CM | POA: Insufficient documentation

## 2018-12-22 DIAGNOSIS — R0789 Other chest pain: Secondary | ICD-10-CM | POA: Insufficient documentation

## 2018-12-22 LAB — CBC WITH DIFFERENTIAL/PLATELET
Abs Immature Granulocytes: 0.02 10*3/uL (ref 0.00–0.07)
Basophils Absolute: 0 10*3/uL (ref 0.0–0.1)
Basophils Relative: 0 %
Eosinophils Absolute: 0.1 10*3/uL (ref 0.0–0.5)
Eosinophils Relative: 2 %
HCT: 43.6 % (ref 36.0–46.0)
Hemoglobin: 14.5 g/dL (ref 12.0–15.0)
Immature Granulocytes: 0 %
Lymphocytes Relative: 36 %
Lymphs Abs: 2.1 10*3/uL (ref 0.7–4.0)
MCH: 32 pg (ref 26.0–34.0)
MCHC: 33.3 g/dL (ref 30.0–36.0)
MCV: 96.2 fL (ref 80.0–100.0)
Monocytes Absolute: 0.3 10*3/uL (ref 0.1–1.0)
Monocytes Relative: 5 %
Neutro Abs: 3.3 10*3/uL (ref 1.7–7.7)
Neutrophils Relative %: 57 %
Platelets: 273 10*3/uL (ref 150–400)
RBC: 4.53 MIL/uL (ref 3.87–5.11)
RDW: 11.9 % (ref 11.5–15.5)
WBC: 5.8 10*3/uL (ref 4.0–10.5)
nRBC: 0 % (ref 0.0–0.2)

## 2018-12-22 LAB — COMPREHENSIVE METABOLIC PANEL
ALT: 16 U/L (ref 0–44)
AST: 15 U/L (ref 15–41)
Albumin: 4.5 g/dL (ref 3.5–5.0)
Alkaline Phosphatase: 57 U/L (ref 38–126)
Anion gap: 9 (ref 5–15)
BUN: 6 mg/dL (ref 6–20)
CO2: 26 mmol/L (ref 22–32)
Calcium: 9.2 mg/dL (ref 8.9–10.3)
Chloride: 104 mmol/L (ref 98–111)
Creatinine, Ser: 0.73 mg/dL (ref 0.44–1.00)
GFR calc Af Amer: >60 mL/min (ref >60)
GFR calc non Af Amer: >60 mL/min (ref >60)
Glucose, Bld: 89 mg/dL (ref 70–99)
Potassium: 3.3 mmol/L — ABNORMAL LOW (ref 3.5–5.1)
Sodium: 139 mmol/L (ref 135–145)
Total Bilirubin: 0.7 mg/dL (ref 0.3–1.2)
Total Protein: 7.3 g/dL (ref 6.5–8.1)

## 2018-12-22 LAB — LIPASE, BLOOD: Lipase: 26 U/L (ref 11–51)

## 2018-12-22 LAB — TROPONIN I: Troponin I: <0.03 ng/mL (ref <0.03)

## 2018-12-22 MED ORDER — SUCRALFATE 1 GM/10ML PO SUSP: 1.0000 g | Freq: Three times a day (TID) | ORAL | 0 refills | Status: DC

## 2018-12-22 MED ORDER — NEXIUM 5 MG PO PACK: 5.0000 mg | PACK | Freq: Every day | ORAL | 2 refills | Status: DC

## 2018-12-22 MED ORDER — SODIUM CHLORIDE 0.9 % IV BOLUS
1000.0000 mL | Freq: Once | INTRAVENOUS | Status: AC
  Administered 2018-12-22: 1000 mL via INTRAVENOUS

## 2018-12-22 MED ORDER — IOHEXOL 300 MG/ML  SOLN
75.0000 mL | Freq: Once | INTRAMUSCULAR | Status: AC | PRN
  Administered 2018-12-22: 75 mL via INTRAVENOUS

## 2018-12-22 NOTE — ED Triage Notes (Signed)
Pt c/o difficulty swallowing; pt spoke with her primary care and he told her to come here; pt states when she lies down she feels like there is a lump on her chest

## 2018-12-22 NOTE — ED Provider Notes (Signed)
[facility] EMERGENCY DEPARTMENT Provider Note   CSN: [id number] Arrival date & time: 12/22/18  0021    History   Chief Complaint Chief Complaint  Patient presents with   Dysphagia    HPI [name] is a 33 y.o. female.     Patient presents to the emergency department for evaluation of difficulty swallowing secondary to a sensation of something caught in her throat or esophagus.  Patient has been sick for a couple of weeks.  She saw her primary doctor with lower abdominal pain approximately 2 weeks ago and was started on Cipro for possible colitis.  She was seen in the ER 1 week ago for continued pain, had a CT scan that did not show any acute findings.  Primary doctor stopped the Cipro but started her on Flomax because she had a kidney stone seen on the CT.  Patient reports that when she started taking the Cipro she started to feel irritation in the esophagus.  This has progressively worsened.  Every time she swallows it feels like something is caught in the upper chest area.  She has not been eating much because of this.  Primary care has arranged for GI follow-up but it has not occurred yet.     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 of being hard to wake up post-op   Headache    History of Chiari malformation    History of MRSA infection    axilla   Laryngotracheitis 06/20/2015   started antibiotic 06/20/2015   Panic attacks    PTSD (post-traumatic stress disorder)    Shoulder dislocation 06/2015   right    Patient Active Problem List   Diagnosis Date Noted   Intractable chronic migraine without aura and with status migrainosus  03/24/2017   Acute appendicitis    Chiari I malformation ([place]) 08/08/2016   Endometriosis 09/16/2012   CERVICALGIA 09/08/2007   CERVICAL SPASM 09/08/2007    Past Surgical History:  Procedure Laterality Date   BRAIN SURGERY     CESAREAN SECTION  09/05/2008   CYSTOSCOPY N/A 09/15/2012   Procedure: CYSTOSCOPY;  Surgeon: Shon Millet II, MD;  Location: [facility];  Service: Gynecology;  Laterality: N/A;   DILATION AND EVACUATION  04/15/2007   HYSTEROSCOPY W/D&C  03/31/2012   Procedure: DILATATION AND CURETTAGE [place];  Surgeon: Allena Katz, MD;  Location: [facility];  Service: Gynecology;  Laterality: N/A;   LAPAROSCOPIC APPENDECTOMY N/A 10/29/2016   Procedure: APPENDECTOMY LAPAROSCOPIC;  Surgeon: Aviva Signs, MD;  Location: [facility];  Service: General;  Laterality: N/A;   LAPAROSCOPIC ASSISTED VAGINAL HYSTERECTOMY N/A 09/15/2012   Procedure: LAPAROSCOPIC ASSISTED VAGINAL HYSTERECTOMY;  Surgeon: Allena Katz, MD;  Location: [facility];  Service: Gynecology;  Laterality: N/A;   LAPAROSCOPY  03/31/2012   Procedure: LAPAROSCOPY OPERATIVE;  Surgeon: Allena Katz, MD;  Location: [facility];  Service: Gynecology;  Laterality: N/A;  with Biopsies of Bilateral Fallopian Tubes; Fulguration of Endometriosis   SHOULDER ARTHROSCOPY WITH CAPSULORRHAPHY Right 06/28/2015   Procedure: RIGHT SHOULDER ARTHROSCOPY WITH CAPSULORRHAPHY;  Surgeon: Earlie Server, MD;  Location:  [facility];  Service: Orthopedics;  Laterality: Right;   SUBOCCIPITAL CRANIECTOMY CERVICAL LAMINECTOMY N/A 08/08/2016   Procedure: SUBOCCIPITAL CRANIECTOMY CERVICAL LAMINECTOMY NUMBER DURAPLASTY;  Surgeon: Tressie StalkerJeffrey Jenkins, MD;  [facility];  Service: Neurosurgery;  Laterality: N/A;  suboccipital   ULNAR NERVE REPAIR Right 05/2015     OB History    Gravida  2   Para  1   Term  1   Preterm      AB  1   Living  1     SAB  1   TAB      Ectopic      Multiple      Live Births                 Home Medications    Prior to Admission medications   Medication Sig Start Date End Date Taking? Authorizing Provider  amphetamine-dextroamphetamine (ADDERALL) 20 MG tablet Take 20 mg by mouth 2 (two) times daily.    [provider]  buPROPion (WELLBUTRIN SR) 100 MG 12 hr tablet Wellbutrin SR 100 mg tablet, 12 hr sustained-release  Take 1 tablet every day by oral route.    [provider]  ciprofloxacin (CIPRO) 500 MG tablet Take 500 mg by mouth 2 (two) times daily.    [provider]  dicyclomine (BENTYL) 10 MG capsule Take 10 mg by mouth 4 (four) times daily -  before meals and at bedtime.    [provider]  Esomeprazole Magnesium (NEXIUM) 5 MG PACK Take 5 mg by mouth daily. 12/22/18   Gilda CreasePollina, [name], MD  famotidine (PEPCID) 20 MG tablet Take 1 tablet (20 mg total) by mouth 2 (two) times daily. 12/16/18   Vanetta MuldersZackowski, Scott, MD  metroNIDAZOLE (FLAGYL) 250 MG tablet Take 250 mg by mouth 3 (three) times daily.    [provider]  sucralfate (CARAFATE) 1 GM/10ML suspension Take 10 mLs (1 g total) by mouth 4 (four) times daily -  with meals and at bedtime. 12/22/18   Gilda CreasePollina, [name], MD    Family History Family History  Problem Relation Age of Onset   Anesthesia problems Father        hard to wake up post-op   Stroke Father     Social History Social History   Tobacco Use   Smoking status: Current Every Day Smoker    Packs/day: 1.00    Years: 10.00    Pack years: 10.00    Types: Cigarettes   Smokeless tobacco: Never Used   Tobacco comment: quit 2 months ago  Substance Use Topics   Alcohol use: No    Comment: social   Drug use: No     Allergies   Dilaudid [hydromorphone hcl], Adhesive [tape], and Morphine and related   Review of Systems Review of Systems  HENT: Positive for trouble swallowing.   All other systems reviewed and are negative.    Physical Exam Updated Vital Signs BP 120/80 (BP  Location: Left Arm)    Pulse 93    Temp 98.1 F (36.7 C) (Oral)    Resp 18    Ht 5\' 7"  (1.702 m)    Wt 102.1 kg    LMP 08/18/2012    SpO2 100%    BMI 35.24 kg/m   Physical Exam Vitals signs and nursing note reviewed.  Constitutional:      General: She is not in acute distress.    Appearance: Normal appearance. She is well-developed.  HENT:  Head: Normocephalic and atraumatic.     Right Ear: Hearing normal.     Left Ear: Hearing normal.     Nose: Nose normal.  Eyes:     Conjunctiva/sclera: Conjunctivae normal.     Pupils: Pupils are equal, round, and reactive to light.  Neck:     Musculoskeletal: Normal range of motion and neck supple.  Cardiovascular:     Rate and Rhythm: Regular rhythm.     Heart sounds: S1 normal and S2 normal. No murmur. No friction rub. No gallop.   Pulmonary:     Effort: Pulmonary effort is normal. No respiratory distress.     Breath sounds: Normal breath sounds.  Chest:     Chest wall: No tenderness.  Abdominal:     General: Bowel sounds are normal.     Palpations: Abdomen is soft.     Tenderness: There is no abdominal tenderness. There is no guarding or rebound. Negative signs include Murphy's sign and McBurney's sign.     Hernia: No hernia is present.  Musculoskeletal: Normal range of motion.  Skin:    General: Skin is warm and dry.     Findings: No rash.  Neurological:     Mental Status: She is alert and oriented to person, place, and time.     GCS: GCS eye subscore is 4. GCS verbal subscore is 5. GCS motor subscore is 6.     Cranial Nerves: No cranial nerve deficit.     Sensory: No sensory deficit.     Coordination: Coordination normal.  Psychiatric:        Speech: Speech normal.        Behavior: Behavior normal.        Thought Content: Thought content normal.      ED Treatments / Results  Labs (all labs ordered are listed, but only abnormal results are displayed) Labs Reviewed  COMPREHENSIVE METABOLIC PANEL - Abnormal; Notable for  the following components:      Result Value   Potassium 3.3 (*)    All other components within normal limits  CBC WITH DIFFERENTIAL/PLATELET  TROPONIN I  LIPASE, BLOOD    EKG EKG Interpretation  Date/Time:  Tuesday December 22 2018 03:15:41 EDT Ventricular Rate:  58 PR Interval:    QRS Duration: 88 QT Interval:  438 QTC Calculation: 431 R Axis:   73 Text Interpretation:  Sinus rhythm Baseline wander in lead(s) V6 Otherwise within normal limits Confirmed by Gilda CreasePollina, Elif Yonts J [phone]([id number]) on 12/22/2018 4:00:37 AM   Radiology Dg Chest 2 View  Result Date: 12/21/2018 CLINICAL DATA:  Chest pain. EXAM: CHEST - 2 VIEW COMPARISON:  12/16/2018 FINDINGS: The cardiomediastinal contours are normal. The lungs are clear. Calcified granuloma at the left lung base. Pulmonary vasculature is normal. No consolidation, pleural effusion, or pneumothorax. No acute osseous abnormalities are seen. IMPRESSION: No acute chest findings. Electronically Signed   By: Narda RutherfordMelanie  Sanford [name]   On: 12/21/2018 19:46   Ct Chest W Contrast  Result Date: 12/22/2018 CLINICAL DATA:  Sensation of mass in chest with difficulty swallowing. Feels like something caught in the esophagus for 2 weeks. EXAM: CT CHEST WITH CONTRAST TECHNIQUE: Multidetector CT imaging of the chest was performed during intravenous contrast administration. CONTRAST:  75mL OMNIPAQUE IOHEXOL 300 MG/ML  SOLN COMPARISON:  Abdominal CT 12/16/2018 FINDINGS: Cardiovascular: Normal heart size. No pericardial effusion. Normal appearance of the aorta and great vessels. There is minimal opacification of the pulmonary arteries. Mediastinum/Nodes: Negative for adenopathy or mass. No thickening  along the esophagus. No visible esophageal foreign body. Lungs/Pleura: Mild dependent atelectasis. There is no edema, consolidation, effusion, or pneumothorax. Upper Abdomen: Hepatic lesions as described on abdominal CT 4 days ago. Small benign-appearing lesion in the upper spleen  measuring 17 mm, also stable. Musculoskeletal: Negative IMPRESSION: Negative chest CT.  No explanation for symptoms. Electronically Signed   By: Marnee SpringJonathon  Watts [name]   On: 12/22/2018 04:54    Procedures Procedures (including critical care time)  Medications Ordered in ED Medications  sodium chloride 0.9 % bolus 1,000 mL (1,000 mLs Intravenous New Bag/Given 12/22/18 0253)  iohexol (OMNIPAQUE) 300 MG/ML solution 75 mL (75 mLs Intravenous Contrast Given 12/22/18 0421)     Initial Impression / Assessment and Plan / ED Course  I have reviewed the triage vital signs and the nursing notes.  Pertinent labs & imaging results that were available during my care of the patient were reviewed by me and considered in my medical decision making (see chart for details).        Patient presents to the emergency department for evaluation of sensation of something stuck in her esophagus.  This is been going on for more than a week since she started taking Cipro.  She thought maybe a pill got caught but symptoms have been persistent.  She feels like she is having trouble swallowing now.  She is able to swallow, however.  Vital signs are normal.  Lab work unremarkable.  This includes cardiac evaluation.  Patient underwent CT of chest to evaluate for extrinsic compression on the esophagus, mass, etc.  No acute abnormality is noted.  Patient reassured, symptoms might be secondary to gastritis and acid reflux.  Will aggressively treat for this while she is awaiting gastroenterology follow-up.  Final Clinical Impressions(s) / ED Diagnoses   Final diagnoses:  Esophagitis    ED Discharge Orders         Ordered    Esomeprazole Magnesium (NEXIUM) 5 MG PACK  Daily     12/22/18 0522    sucralfate (CARAFATE) 1 GM/10ML suspension  3 times daily with meals & bedtime     12/22/18 0522           Gilda CreasePollina, Amor Packard J, MD 12/22/18 [phone]

## 2018-12-22 NOTE — ED Notes (Signed)
No answer for room x3 

## 2018-12-23 ENCOUNTER — Emergency Department (HOSPITAL_COMMUNITY)
Admission: EM | Admit: 2018-12-23 | Discharge: 2018-12-23 | Disposition: A | Discharge status: Home / Self Care | Payer: Medicaid Other | Attending: Emergency Medicine | Admitting: Emergency Medicine

## 2018-12-23 ENCOUNTER — Encounter (HOSPITAL_COMMUNITY): Payer: Self-pay | Admitting: Emergency Medicine

## 2018-12-23 ENCOUNTER — Other Ambulatory Visit: Payer: Self-pay

## 2018-12-23 DIAGNOSIS — Z5321 Procedure and treatment not carried out due to patient leaving prior to being seen by health care provider: Secondary | ICD-10-CM | POA: Insufficient documentation

## 2018-12-23 DIAGNOSIS — Z4801 Encounter for change or removal of surgical wound dressing: Secondary | ICD-10-CM | POA: Insufficient documentation

## 2018-12-23 NOTE — ED Triage Notes (Signed)
Pt was seen on the 6/22 and had an IV placed.  IV was d/c. Pt now states having " green liquid" leaking from the IV site.  Pt c/o of pain, rash and burning in her left elbow

## 2018-12-23 NOTE — ED Notes (Signed)
AC called by ED staff to waiting room to speak with patient. Patient upset about having to wait to go to a room because she was "told to return to ED by patient experience" for assessment of drainage from previous day's IV site. Triage process and bed availability explained to patient. Pt states " this is ridiculous, this is y'alls fault and I should not have to wait ". Pt then states that the triage RN pulled the band-aid off of her arm "too rough" and again verbalized her complaint of redness and drainage at the IV site. This RN stated she would take the nurse's name, speak with her and notify her supervisor, but that patient should still be placed in a room to be assessed by a provider. Pt states "just forget it, tell them to take me out, i'm leaving". Pt walked out of ED lobby to parking lot.

## 2019-01-06 ENCOUNTER — Other Ambulatory Visit: Payer: Self-pay | Admitting: Gastroenterology

## 2019-01-06 DIAGNOSIS — R1310 Dysphagia, unspecified: Secondary | ICD-10-CM

## 2019-01-11 ENCOUNTER — Ambulatory Visit
Admission: RE | Admit: 2019-01-11 | Discharge: 2019-01-11 | Disposition: A | Discharge status: Home / Self Care | Payer: Medicaid Other | Source: Ambulatory Visit | Attending: Gastroenterology | Admitting: Gastroenterology

## 2019-01-11 DIAGNOSIS — R1310 Dysphagia, unspecified: Secondary | ICD-10-CM

## 2019-04-06 ENCOUNTER — Emergency Department (HOSPITAL_COMMUNITY)
Admission: EM | Admit: 2019-04-06 | Discharge: 2019-04-06 | Disposition: A | Discharge status: Home / Self Care | Payer: Medicaid Other | Attending: Emergency Medicine | Admitting: Emergency Medicine

## 2019-04-06 ENCOUNTER — Emergency Department (HOSPITAL_COMMUNITY): Payer: Medicaid Other

## 2019-04-06 ENCOUNTER — Other Ambulatory Visit: Payer: Self-pay

## 2019-04-06 DIAGNOSIS — K209 Esophagitis, unspecified without bleeding: Secondary | ICD-10-CM | POA: Diagnosis not present

## 2019-04-06 DIAGNOSIS — Q0700 Arnold-Chiari syndrome without spina bifida or hydrocephalus: Secondary | ICD-10-CM | POA: Diagnosis not present

## 2019-04-06 DIAGNOSIS — J45909 Unspecified asthma, uncomplicated: Secondary | ICD-10-CM | POA: Insufficient documentation

## 2019-04-06 DIAGNOSIS — F17210 Nicotine dependence, cigarettes, uncomplicated: Secondary | ICD-10-CM | POA: Diagnosis not present

## 2019-04-06 DIAGNOSIS — Z79899 Other long term (current) drug therapy: Secondary | ICD-10-CM | POA: Insufficient documentation

## 2019-04-06 DIAGNOSIS — R1310 Dysphagia, unspecified: Secondary | ICD-10-CM | POA: Diagnosis present

## 2019-04-06 LAB — CBC WITH DIFFERENTIAL/PLATELET
Abs Immature Granulocytes: 0.04 10*3/uL (ref 0.00–0.07)
Basophils Absolute: 0 10*3/uL (ref 0.0–0.1)
Basophils Relative: 0 %
Eosinophils Absolute: 0.1 10*3/uL (ref 0.0–0.5)
Eosinophils Relative: 1 %
HCT: 46.7 % — ABNORMAL HIGH (ref 36.0–46.0)
Hemoglobin: 15.3 g/dL — ABNORMAL HIGH (ref 12.0–15.0)
Immature Granulocytes: 1 %
Lymphocytes Relative: 26 %
Lymphs Abs: 1.8 10*3/uL (ref 0.7–4.0)
MCH: 33.5 pg (ref 26.0–34.0)
MCHC: 32.8 g/dL (ref 30.0–36.0)
MCV: 102.2 fL — ABNORMAL HIGH (ref 80.0–100.0)
Monocytes Absolute: 0.3 10*3/uL (ref 0.1–1.0)
Monocytes Relative: 5 %
Neutro Abs: 4.7 10*3/uL (ref 1.7–7.7)
Neutrophils Relative %: 67 %
Platelets: 245 10*3/uL (ref 150–400)
RBC: 4.57 MIL/uL (ref 3.87–5.11)
RDW: 13.2 % (ref 11.5–15.5)
WBC: 7 10*3/uL (ref 4.0–10.5)
nRBC: 0 % (ref 0.0–0.2)

## 2019-04-06 LAB — COMPREHENSIVE METABOLIC PANEL
ALT: 20 U/L (ref 0–44)
AST: 17 U/L (ref 15–41)
Albumin: 3.8 g/dL (ref 3.5–5.0)
Alkaline Phosphatase: 62 U/L (ref 38–126)
Anion gap: 11 (ref 5–15)
BUN: 8 mg/dL (ref 6–20)
CO2: 19 mmol/L — ABNORMAL LOW (ref 22–32)
Calcium: 8.8 mg/dL — ABNORMAL LOW (ref 8.9–10.3)
Chloride: 106 mmol/L (ref 98–111)
Creatinine, Ser: 0.74 mg/dL (ref 0.44–1.00)
GFR calc Af Amer: >60 mL/min (ref >60)
GFR calc non Af Amer: >60 mL/min (ref >60)
Glucose, Bld: 77 mg/dL (ref 70–99)
Potassium: 4 mmol/L (ref 3.5–5.1)
Sodium: 136 mmol/L (ref 135–145)
Total Bilirubin: 1.5 mg/dL — ABNORMAL HIGH (ref 0.3–1.2)
Total Protein: 6.6 g/dL (ref 6.5–8.1)

## 2019-04-06 LAB — LIPASE, BLOOD: Lipase: 24 U/L (ref 11–51)

## 2019-04-06 MED ORDER — IOHEXOL 350 MG/ML SOLN
150.0000 mL | Freq: Once | INTRAVENOUS | Status: AC | PRN
  Administered 2019-04-06: 150 mL via ORAL

## 2019-04-06 MED ORDER — SODIUM CHLORIDE 0.9 % IV SOLN: INTRAVENOUS | Status: DC

## 2019-04-06 NOTE — ED Triage Notes (Signed)
Pt here after endoscopy on Friday, since then has felt like she has a rock in her throat and has had difficulty swallowing, even thin liquids like soups. Denies shob, airway intact. Sts her doctor advised her to come here for evaluation of blood clot in her throat.

## 2019-04-06 NOTE — ED Notes (Signed)
Patient transported to X-ray 

## 2019-04-06 NOTE — ED Provider Notes (Signed)
[facility] EMERGENCY DEPARTMENT Provider Note   CSN: [id number] Arrival date & time: 04/06/19  1453     History   Chief Complaint Chief Complaint  Patient presents with  . Dysphagia    HPI [name] is a 33 y.o. female.     33 year old female presents with pain with swallowing.  Patient had endoscopy several days ago and since that time is had pain with swallowing.  Denies any vomiting.  Does have history of GERD and states that his gotten a lot worse.  Notes some epigastric abdominal discomfort.  Pain is characterizes sharp and seems to wax and wane.  No urinary symptoms.  No treatment use prior to arrival.  Called the office and was told to come here for evaluation of possible blood clot in her esophagus.  Patient sees Dr. Randa EvensEdwards.     Past Medical History:  Diagnosis Date  . Anxiety   . 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 Bipolar disorder ([facility])   . Cervicalgia   . Childhood asthma    seasonal with allergies  . Complication of anesthesia    hard to wake up post-op  . Depression   . Family history of adverse reaction to anesthesia    pt's father has hx. of being hard to wake up post-op  . Headache   . History of Chiari malformation   . History of MRSA infection    axilla  . 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 Panic attacks   . PTSD (post-traumatic stress disorder)   . Shoulder dislocation 06/2015   right    Patient Active Problem List   Diagnosis Date Noted  . 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 Acute appendicitis   . 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 Endometriosis 09/16/2012  . CERVICALGIA 09/08/2007  . CERVICAL SPASM 09/08/2007    Past Surgical History:  Procedure Laterality Date  . BRAIN SURGERY    . CESAREAN SECTION  09/05/2008  . CYSTOSCOPY N/A 09/15/2012   Procedure: CYSTOSCOPY;  Surgeon: Leslie AndreaJames E Tomblin II,  MD;  Location: [facility];  Service: Gynecology;  Laterality: N/A;  . DILATION AND EVACUATION  04/15/2007  . HYSTEROSCOPY W/D&C  03/31/2012   Procedure: DILATATION AND CURETTAGE /HYSTEROSCOPY;  Surgeon: Leslie AndreaJames E Tomblin II, MD;  Location: [facility];  Service: Gynecology;  Laterality: N/A;  . LAPAROSCOPIC APPENDECTOMY N/A 10/29/2016   Procedure: APPENDECTOMY LAPAROSCOPIC;  Surgeon: Franky MachoJenkins, Mark, MD;  Location: [facility];  Service: General;  Laterality: N/A;  . LAPAROSCOPIC ASSISTED VAGINAL HYSTERECTOMY N/A 09/15/2012   Procedure: LAPAROSCOPIC ASSISTED VAGINAL HYSTERECTOMY;  Surgeon: Leslie AndreaJames E Tomblin II, MD;  Location: [facility];  Service: Gynecology;  Laterality: N/A;  . LAPAROSCOPY  03/31/2012   Procedure: LAPAROSCOPY OPERATIVE;  Surgeon: Leslie AndreaJames E Tomblin II, MD;  Location: [facility];  Service: Gynecology;  Laterality: N/A;  with Biopsies of Bilateral Fallopian Tubes; Fulguration of Endometriosis  . SHOULDER ARTHROSCOPY WITH CAPSULORRHAPHY Right 06/28/2015   Procedure: RIGHT SHOULDER ARTHROSCOPY WITH CAPSULORRHAPHY;  Surgeon: Frederico Hammananiel Caffrey, MD;  Location: [facility];  Service: Orthopedics;  Laterality: Right;  . SUBOCCIPITAL CRANIECTOMY CERVICAL LAMINECTOMY N/A 08/08/2016   Procedure: SUBOCCIPITAL CRANIECTOMY CERVICAL LAMINECTOMY NUMBER DURAPLASTY;  Surgeon: Tressie StalkerJeffrey Jenkins, MD;  [facility];  Service: Neurosurgery;  Laterality: N/A;  suboccipital  . ULNAR NERVE REPAIR Right 05/2015     OB History    Gravida  2   Para  1   Term  1   Preterm      AB  1   Living  1     SAB  1   TAB      Ectopic      Multiple      Live Births               Home Medications    Prior to Admission medications   Medication Sig Start Date End Date Taking? Authorizing Provider  amphetamine-dextroamphetamine (ADDERALL) 20 MG tablet Take 20 mg by mouth 2 (two) times daily.    [provider]  buPROPion (WELLBUTRIN SR) 100 MG 12 hr tablet Wellbutrin SR 100 mg tablet, 12 hr  sustained-release  Take 1 tablet every day by oral route.    [provider]  ciprofloxacin (CIPRO) 500 MG tablet Take 500 mg by mouth 2 (two) times daily.    [provider]  dicyclomine (BENTYL) 10 MG capsule Take 10 mg by mouth 4 (four) times daily -  before meals and at bedtime.    [provider]  Esomeprazole Magnesium (NEXIUM) 5 MG PACK Take 5 mg by mouth daily. 12/22/18   Orpah Greek, MD  famotidine (PEPCID) 20 MG tablet Take 1 tablet (20 mg total) by mouth 2 (two) times daily. 12/16/18   Fredia Sorrow, MD  metroNIDAZOLE (FLAGYL) 250 MG tablet Take 250 mg by mouth 3 (three) times daily.    [provider]  sucralfate (CARAFATE) 1 GM/10ML suspension Take 10 mLs (1 g total) by mouth 4 (four) times daily -  with meals and at bedtime. 12/22/18   Orpah Greek, MD    Family History Family History  Problem Relation Age of Onset  . Anesthesia problems Father        hard to wake up post-op  . Stroke Father     Social History Social History   Tobacco Use  . Smoking status: Current Every Day Smoker    Packs/day: 1.00    Years: 10.00    Pack years: 10.00    Types: Cigarettes  . Smokeless tobacco: Never Used  . Tobacco comment: quit 2 months ago  Substance Use Topics  . Alcohol use: No    Comment: social  . Drug use: No     Allergies   Dilaudid [hydromorphone hcl], Adhesive [tape], and Morphine and related   Review of Systems Review of Systems  All other systems reviewed and are negative.    Physical Exam Updated Vital Signs BP 135/84 (BP Location: Left Arm)   Pulse 94   Temp 98.2 F (36.8 C) (Oral)   Resp 16   LMP 08/18/2012   SpO2 99%   Physical Exam Vitals signs and nursing note reviewed.  Constitutional:      General: She is not in acute distress.    Appearance: Normal appearance. She is well-developed. She is not toxic-appearing.  HENT:     Head: Normocephalic and atraumatic.  Eyes:     General:  Lids are normal.     Conjunctiva/sclera: Conjunctivae normal.     Pupils: Pupils are equal, round, and reactive to light.  Neck:     Musculoskeletal: Normal range of motion and neck supple.     Thyroid: No thyroid mass.     Trachea: No tracheal deviation.  Cardiovascular:     Rate and Rhythm: Normal rate and regular rhythm.     Heart sounds: Normal heart sounds. No murmur. No gallop.  Pulmonary:     Effort: Pulmonary effort is normal. No respiratory distress.     Breath sounds: Normal breath sounds. No stridor. No decreased breath sounds, wheezing, rhonchi or rales.  Abdominal:     General: Bowel sounds are normal. There is no distension.     Palpations: Abdomen is soft.     Tenderness: There is abdominal tenderness in the epigastric area. There is no guarding or rebound.    Musculoskeletal: Normal range of motion.        General: No tenderness.  Skin:    General: Skin is warm and dry.     Findings: No abrasion or rash.  Neurological:     Mental Status: She is alert and oriented to person, place, and time.     GCS: GCS eye subscore is 4. GCS verbal subscore is 5. GCS motor subscore is 6.     Cranial Nerves: No cranial nerve deficit.     Sensory: No sensory deficit.  Psychiatric:        Speech: Speech normal.        Behavior: Behavior normal.      ED Treatments / Results  Labs (all labs ordered are listed, but only abnormal results are displayed) Labs Reviewed - No data to display  EKG None  Radiology No results found.  Procedures Procedures (including critical care time)  Medications Ordered in ED Medications - No data to display   Initial Impression / Assessment and Plan / ED Course  I have reviewed the triage vital signs and the nursing notes.  Pertinent labs & imaging results that were available during my care of the patient were reviewed by me and considered in my medical decision making (see chart for details).        Discussed with patient's  gastroenterologist, Dr. Randa Evens, who recommends patient have a Gastrografin study.  That study performed and was negative.  Labs are reassuring here.  Patient states that she has had symptoms now for the last 4 months without an etiology.  Symptoms are all when she swallows and feels as if something gets stuck in her throat.  Per Dr. Randa Evens, he did not see any signs obstruction on her endoscopy last week.  She will follow-up with him  Final Clinical Impressions(s) / ED Diagnoses   Final diagnoses:  None    ED Discharge Orders    None       Lorre Nick, MD 04/06/19 1953

## 2019-04-10 ENCOUNTER — Emergency Department (HOSPITAL_COMMUNITY): Payer: Medicaid Other

## 2019-04-10 ENCOUNTER — Emergency Department (HOSPITAL_COMMUNITY)
Admission: EM | Admit: 2019-04-10 | Discharge: 2019-04-10 | Disposition: A | Discharge status: Home / Self Care | Payer: Medicaid Other | Attending: Emergency Medicine | Admitting: Emergency Medicine

## 2019-04-10 ENCOUNTER — Encounter (HOSPITAL_COMMUNITY): Payer: Self-pay

## 2019-04-10 ENCOUNTER — Other Ambulatory Visit: Payer: Self-pay

## 2019-04-10 DIAGNOSIS — F17210 Nicotine dependence, cigarettes, uncomplicated: Secondary | ICD-10-CM | POA: Diagnosis not present

## 2019-04-10 DIAGNOSIS — R0789 Other chest pain: Secondary | ICD-10-CM | POA: Insufficient documentation

## 2019-04-10 DIAGNOSIS — Z79899 Other long term (current) drug therapy: Secondary | ICD-10-CM | POA: Insufficient documentation

## 2019-04-10 LAB — TROPONIN I (HIGH SENSITIVITY): Troponin I (High Sensitivity): <2 ng/L (ref <18)

## 2019-04-10 LAB — LIPASE, BLOOD: Lipase: 26 U/L (ref 11–51)

## 2019-04-10 LAB — COMPREHENSIVE METABOLIC PANEL
ALT: 19 U/L (ref 0–44)
AST: 15 U/L (ref 15–41)
Albumin: 4 g/dL (ref 3.5–5.0)
Alkaline Phosphatase: 65 U/L (ref 38–126)
Anion gap: 8 (ref 5–15)
BUN: 9 mg/dL (ref 6–20)
CO2: 25 mmol/L (ref 22–32)
Calcium: 8.6 mg/dL — ABNORMAL LOW (ref 8.9–10.3)
Chloride: 105 mmol/L (ref 98–111)
Creatinine, Ser: 0.67 mg/dL (ref 0.44–1.00)
GFR calc Af Amer: >60 mL/min (ref >60)
GFR calc non Af Amer: >60 mL/min (ref >60)
Glucose, Bld: 88 mg/dL (ref 70–99)
Potassium: 3.8 mmol/L (ref 3.5–5.1)
Sodium: 138 mmol/L (ref 135–145)
Total Bilirubin: 0.2 mg/dL — ABNORMAL LOW (ref 0.3–1.2)
Total Protein: 7 g/dL (ref 6.5–8.1)

## 2019-04-10 LAB — CBC
HCT: 46.6 % — ABNORMAL HIGH (ref 36.0–46.0)
Hemoglobin: 15.2 g/dL — ABNORMAL HIGH (ref 12.0–15.0)
MCH: 33 pg (ref 26.0–34.0)
MCHC: 32.6 g/dL (ref 30.0–36.0)
MCV: 101.1 fL — ABNORMAL HIGH (ref 80.0–100.0)
Platelets: 248 10*3/uL (ref 150–400)
RBC: 4.61 MIL/uL (ref 3.87–5.11)
RDW: 13.3 % (ref 11.5–15.5)
WBC: 5.7 10*3/uL (ref 4.0–10.5)
nRBC: 0 % (ref 0.0–0.2)

## 2019-04-10 LAB — D-DIMER, QUANTITATIVE: D-Dimer, Quant: <0.27 ug/mL-FEU (ref 0.00–0.50)

## 2019-04-10 MED ORDER — IOHEXOL 300 MG/ML  SOLN: 75.0000 mL | Freq: Once | INTRAMUSCULAR | Status: DC | PRN

## 2019-04-10 MED ORDER — SODIUM CHLORIDE 0.9 % IV SOLN: INTRAVENOUS | Status: DC

## 2019-04-10 NOTE — ED Notes (Signed)
Pt ambulatory to the bathroom, IV set ready

## 2019-04-10 NOTE — ED Notes (Signed)
Lab at the bedside, patient refusing any more lab draws.
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
## 2019-04-10 NOTE — Discharge Instructions (Addendum)
Keep your follow-up with ear nose and throat.  Follow back up with your primary care doctor.  Also referral given for follow-up by cardiology.  But today's work-up highly suggestive more of chest wall pain.  No evidence of pulmonary embolus no evidence of any acute cardiac event.

## 2019-04-10 NOTE — ED Notes (Signed)
Pt undated, waiting on all labs to result. Patient sitting in bed, not complaining.

## 2019-04-10 NOTE — ED Provider Notes (Addendum)
[facility] EMERGENCY DEPARTMENT Provider Note   CSN: [id number] Arrival date & time: 04/10/19  4917     History   Chief Complaint Chief Complaint  Patient presents with  . Chest Pain    HPI [name] is a 33 y.o. female.     Patient today, presenting with a complaint of left-sided chest pain for 2 weeks worse in the last few days.  Worse with movement worse with taking a deep breath.  Denies any leg swelling.  Patient recently had an upper endoscopy done by Dr. Randa Evens for dysphasia.  Patient seen in the emergency department with chest discomfort [place] on October 6.  Had work-up to include Gastrografin swallow without any evidence of any complicating factor secondary to the upper endoscopy.  But patient states that this feels different.  And which she was evaluated for.  Also some questions of palpitations.  Denies any fevers.  No nausea or vomiting.  Gastroenterology had recommended follow-up with ear nose and throat following their upper endoscopy.  Patient's not have the ability to do that yet.     Past Medical History:  Diagnosis Date  . Anxiety   . 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 Bipolar disorder (HCC)   . Cervicalgia   . Childhood asthma    seasonal with allergies  . Complication of anesthesia    hard to wake up post-op  . Depression   . Family history of adverse reaction to anesthesia    pt's father has hx. of being hard to wake up post-op  . Headache   . History of Chiari malformation   . History of MRSA infection    axilla  . 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 Panic attacks   . PTSD (post-traumatic stress disorder)   . Shoulder dislocation 06/2015   right    Patient Active Problem List   Diagnosis Date Noted  . 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 Acute appendicitis   . 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 Endometriosis 09/16/2012   . CERVICALGIA 09/08/2007  . CERVICAL SPASM 09/08/2007    Past Surgical History:  Procedure Laterality Date  . BRAIN SURGERY    . CESAREAN SECTION  09/05/2008  . CYSTOSCOPY N/A 09/15/2012   Procedure: CYSTOSCOPY;  Surgeon: Leslie Andrea, MD;  Location: [facility];  Service: Gynecology;  Laterality: N/A;  . DILATION AND EVACUATION  04/15/2007  . HYSTEROSCOPY W/D&C  03/31/2012   Procedure: DILATATION AND CURETTAGE /HYSTEROSCOPY;  Surgeon: Leslie Andrea, MD;  Location: [facility];  Service: Gynecology;  Laterality: N/A;  . LAPAROSCOPIC APPENDECTOMY N/A 10/29/2016   Procedure: APPENDECTOMY LAPAROSCOPIC;  Surgeon: Franky Macho, MD;  Location: [facility];  Service: General;  Laterality: N/A;  . LAPAROSCOPIC ASSISTED VAGINAL HYSTERECTOMY N/A 09/15/2012   Procedure: LAPAROSCOPIC ASSISTED VAGINAL HYSTERECTOMY;  Surgeon: Leslie Andrea, MD;  Location: [facility];  Service: Gynecology;  Laterality: N/A;  . LAPAROSCOPY  03/31/2012   Procedure: LAPAROSCOPY OPERATIVE;  Surgeon: Leslie Andrea, MD;  Location: [facility];  Service: Gynecology;  Laterality: N/A;  with Biopsies of Bilateral Fallopian Tubes; Fulguration of Endometriosis  . SHOULDER ARTHROSCOPY WITH CAPSULORRHAPHY Right 06/28/2015   Procedure: RIGHT SHOULDER ARTHROSCOPY WITH CAPSULORRHAPHY;  Surgeon: Frederico Hamman, MD;  Location: [facility];  Service: Orthopedics;  Laterality: Right;  . SUBOCCIPITAL CRANIECTOMY CERVICAL LAMINECTOMY N/A 08/08/2016   Procedure: SUBOCCIPITAL CRANIECTOMY CERVICAL LAMINECTOMY NUMBER DURAPLASTY;  Surgeon: Newman Pies, MD;  Location: [facility];  Service: Neurosurgery;  Laterality: N/A;  suboccipital  . ULNAR NERVE REPAIR Right 05/2015     OB History    Gravida  2   Para  1   Term  1   Preterm      AB  1   Living  1     SAB  1   TAB      Ectopic      Multiple      Live Births               Home Medications    Prior to Admission medications   Medication Sig Start Date End Date  Taking? Authorizing Provider  amphetamine-dextroamphetamine (ADDERALL) 20 MG tablet Take 20 mg by mouth 2 (two) times daily.    [provider]  buPROPion (WELLBUTRIN SR) 100 MG 12 hr tablet Wellbutrin SR 100 mg tablet, 12 hr sustained-release  Take 1 tablet every day by oral route.    [provider]  ciprofloxacin (CIPRO) 500 MG tablet Take 500 mg by mouth 2 (two) times daily.    [provider]  dicyclomine (BENTYL) 10 MG capsule Take 10 mg by mouth 4 (four) times daily -  before meals and at bedtime.    [provider]  Esomeprazole Magnesium (NEXIUM) 5 MG PACK Take 5 mg by mouth daily. 12/22/18   Orpah Greek, MD  famotidine (PEPCID) 20 MG tablet Take 1 tablet (20 mg total) by mouth 2 (two) times daily. 12/16/18   Fredia Sorrow, MD  metroNIDAZOLE (FLAGYL) 250 MG tablet Take 250 mg by mouth 3 (three) times daily.    [provider]  sucralfate (CARAFATE) 1 GM/10ML suspension Take 10 mLs (1 g total) by mouth 4 (four) times daily -  with meals and at bedtime. 12/22/18   Orpah Greek, MD    Family History Family History  Problem Relation Age of Onset  . Anesthesia problems Father        hard to wake up post-op  . Stroke Father     Social History Social History   Tobacco Use  . Smoking status: Current Every Day Smoker    Packs/day: 1.00    Years: 10.00    Pack years: 10.00    Types: Cigarettes  . Smokeless tobacco: Never Used  . Tobacco comment: quit 2 months ago  Substance Use Topics  . Alcohol use: No    Comment: social  . Drug use: No     Allergies   Dilaudid [hydromorphone hcl], Adhesive [tape], and Morphine and related   Review of Systems Review of Systems  Constitutional: Negative for chills and fever.  HENT: Negative for congestion, rhinorrhea and sore throat.   Eyes: Negative for visual disturbance.  Respiratory: Negative for cough and shortness of breath.   Cardiovascular: Positive for chest pain  and palpitations. Negative for leg swelling.  Gastrointestinal: Negative for abdominal pain, diarrhea, nausea and vomiting.  Genitourinary: Negative for dysuria.  Musculoskeletal: Negative for back pain and neck pain.  Skin: Negative for rash.  Neurological: Negative for dizziness, light-headedness and headaches.  Hematological: Does not bruise/bleed easily.  Psychiatric/Behavioral: Negative for confusion.     Physical Exam Updated Vital Signs BP 126/87 (BP Location: Right Arm)   Pulse 76   Temp 98 F (36.7 C) (Oral)   Resp 18   Ht 1.702 m (5\' 7" )   Wt 92.5 kg   LMP 08/18/2012   SpO2  100%   BMI 31.95 kg/m   Physical Exam Vitals signs and nursing note reviewed.  Constitutional:      General: She is not in acute distress.    Appearance: Normal appearance. She is well-developed.  HENT:     Head: Normocephalic and atraumatic.  Eyes:     Extraocular Movements: Extraocular movements intact.     Conjunctiva/sclera: Conjunctivae normal.     Pupils: Pupils are equal, round, and reactive to light.  Neck:     Musculoskeletal: Normal range of motion and neck supple.  Cardiovascular:     Rate and Rhythm: Normal rate and regular rhythm.     Heart sounds: No murmur.  Pulmonary:     Effort: Pulmonary effort is normal. No respiratory distress.     Breath sounds: Normal breath sounds. No wheezing.  Chest:     Chest wall: Tenderness present.  Abdominal:     Palpations: Abdomen is soft.     Tenderness: There is no abdominal tenderness.  Musculoskeletal:        General: No swelling.  Skin:    General: Skin is warm and dry.  Neurological:     General: No focal deficit present.     Mental Status: She is alert and oriented to person, place, and time.      ED Treatments / Results  Labs (all labs ordered are listed, but only abnormal results are displayed) Labs Reviewed  CBC  COMPREHENSIVE METABOLIC PANEL  LIPASE, BLOOD  D-DIMER, QUANTITATIVE (NOT AT [facility])  TROPONIN I (HIGH  SENSITIVITY)    EKG EKG Interpretation  Date/Time:  Saturday April 10 2019 07:12:55 EDT Ventricular Rate:  68 PR Interval:    QRS Duration: 85 QT Interval:  404 QTC Calculation: 430 R Axis:   65 Text Interpretation:  Sinus rhythm No significant change since last tracing Confirmed by Vanetta MuldersZackowski, Sanav Remer [phone]([id number]) on 04/10/2019 8:01:30 AM   Radiology No results found.  Procedures Procedures (including critical care time)  Medications Ordered in ED Medications - No data to display   Initial Impression / Assessment and Plan / ED Course  I have reviewed the triage vital signs and the nursing notes.  Pertinent labs & imaging results that were available during my care of the patient were reviewed by me and considered in my medical decision making (see chart for details).        Patient presentation patient most likely does not have a complicating factor due to the upper endoscopy.  Is certainly is describing pleuritic or chest wall type pain.  We will do cardiac monitoring to rule out any palpitations here.  Single troponin should be sufficient since the pain is been fairly significant for the past 3 days.  EKG shows no acute changes.  Chest x-ray to do initial evaluation of the cardiopulmonary status.  Will get d-dimer to rule out pulmonary embolus as well.  Depending on labs and initial x-rays may go on to do CT of chest.  Initial presentation seems to be highly suggestive of noncardiac chest pain something this more pleuritic or chest wall pain.  Pulmonary embolus needs to be ruled out.  CT scan of the chest without any acute findings.  It was not done to rule out pulmonary embolism because her d-dimer was already normal.  Was done just to get a closer look compared to chest x-ray at the patient's request.  Troponin without any acute changes.  Symptoms very consistent with chest wall type pain.  Patient will follow-up with  the ear nose and throat as per the request of her  gastroenterologist.  We will give her referral to cardiology if she wants to make an appointment but more importantly follow back up with her primary care doctor.   Again no evidence of acute cardiac event no evidence of pulmonary embolism.  No evidence of any subtle findings on the chest.  Symptoms highly suggestive of chest wall pain.  Final Clinical Impressions(s) / ED Diagnoses   Final diagnoses:  None    ED Discharge Orders    None       Vanetta Mulders, MD 04/10/19 1610    Vanetta Mulders, MD 04/10/19 [phone]
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
## 2019-04-10 NOTE — ED Notes (Signed)
Two attempt for IV with Ultra sound by 2 RN's, Pt not wanting to be stuck again, MD advised, discussed with pt to CT without contrast, pt calling family and agrees to CT
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
## 2019-04-14 ENCOUNTER — Encounter: Payer: Self-pay | Admitting: Cardiovascular Disease

## 2019-04-14 ENCOUNTER — Other Ambulatory Visit: Payer: Self-pay

## 2019-04-14 ENCOUNTER — Ambulatory Visit (INDEPENDENT_AMBULATORY_CARE_PROVIDER_SITE_OTHER): Payer: Medicaid Other | Admitting: Cardiovascular Disease

## 2019-04-14 VITALS — BP 114/68 | HR 68 | Ht 67.0 in | Wt 202.0 lb

## 2019-04-14 DIAGNOSIS — R0789 Other chest pain: Secondary | ICD-10-CM | POA: Diagnosis not present

## 2019-04-14 DIAGNOSIS — R079 Chest pain, unspecified: Secondary | ICD-10-CM | POA: Diagnosis not present

## 2019-04-14 NOTE — Patient Instructions (Signed)
Medication Instructions:   If you need a refill on your cardiac medications before your next appointment, please call your pharmacy.   Lab work:  If you have labs (blood work) drawn today and your tests are completely normal, you will receive your results only by: . MyChart Message (if you have MyChart) OR . A paper copy in the mail If you have any lab test that is abnormal or we need to change your treatment, we will call you to review the results.  Testing/Procedures: None ordered today.  Follow-Up: At [facility], you and your health needs are our priority.  As part of our continuing mission to provide you with exceptional heart care, we have created designated Provider Care Teams.  These Care Teams include your primary Cardiologist (physician) and Advanced Practice Providers (APPs -  Physician Assistants and Nurse Practitioners) who all work together to provide you with the care you need, when you need it. . You will need a follow up appointment as needed with Dr. Nishan.    

## 2019-08-16 ENCOUNTER — Encounter: Payer: Self-pay | Admitting: Neurology

## 2019-08-16 NOTE — Progress Notes (Signed)
Virtual Visit via Video Note The purpose of this virtual visit is to provide medical care while limiting exposure to the novel coronavirus.    Consent was obtained for video visit:  Yes.   Answered questions that patient had about telehealth interaction:  Yes.   I discussed the limitations, risks, security and privacy concerns of performing an evaluation and management service by telemedicine. I also discussed with the patient that there may be a patient responsible charge related to this service. The patient expressed understanding and agreed to proceed.  Pt location: Home Physician Location: office Name of referring provider:  Donald Prose, MD I connected with Carman Ching at patients initiation/request on 08/17/2019 at  9:10 AM EST by video enabled telemedicine application and verified that I am speaking with the correct person using two identifiers. Pt MRN:  [id number] Pt DOB:  1985-07-22 Video Participants:  Carman Ching   History of Present Illness:  [name] is a 34 year old female with ADD, PTSD, tobacco use disorder and history of Chiari malformation status post surgery 08/08/2016 who presents for migraines.  History supplemented by referring provider's and prior neurologist's notes.  Onset:  She has had infrequent migraines since she was a teenager.  They worsened after surgery for Chiari malformation in February 2018. Location:  Top of head, sometimes associated neck pain.  Sometimes preceded by jolts of pain in head. Quality:  Pounding, throbbing, pressure Intensity:  Severe.  She denies new headache, thunderclap headache or severe headache that wakes her from sleep. Aura:  Right sided facial sensory disturbance (not numb or tingling) and right sided upper and lower facial weakness. Premonitory Phase:  none Postdrome:  Feels washed out for a day Associated symptoms:  Right facial weakness and altered sensory disturbance as well as clumsiness and altered  sensory disturbance of her right hand. Photophobia, phonophobia.  Rarely nausea.  No vomiting. Duration:  2 days (first day the worst).  May occur up to 7 days.  Right facial droop lasts 24 hours or up to 48 hours for more severe) Frequency:  Since stopping Ajovy, mild to moderate occur 2 to 3 times a week; about 2 severe migraines a month. Frequency of abortive medication: 2 to 3 days a week ibuprofen at most. Triggers:  Emotional stress Exacerbating factors:  Bending over Relieving factors:  nothing Activity:  Cannot function when severe.  02/13/2017 MRI BRAIN W WO (personally reviewed):  Status post suboccipital decompressive craniectomy; otherwise unremarkable. 11/10/2016 CT HEAD WO (personally reviewed):  Status post suboccipital decompressive craniectomy; otherwise, unremarkable.  Current NSAIDS:  Ibuprofen Current analgesics:  none Current triptans:  none Current ergotamine:  none Current anti-emetic:  none Current muscle relaxants:  none Current anti-anxiolytic:  none Current sleep aide:  none Current Antihypertensive medications:  none Current Antidepressant medications:  none Current Anticonvulsant medications:  none Current anti-CGRP:  none Current Vitamins/Herbal/Supplements:  none Current Antihistamines/Decongestants:  none Other therapy:  none Hormone/birth control:  none Other medications:  Adderall  Past NSAIDS:  Diclofenac 50mg  EC;  meloxicam Past analgesics:  Tylenol #3; tramadol; Tylenol; Excedrin Migraine Past abortive triptans:  none Past abortive ergotamine:  none Past muscle relaxants:  Methocarbamol; tizanidine Past anti-emetic:  Promethazine 25mg ; procholorperzine; ondansetron 4mg  Past antihypertensive medications:  propranolol Past antidepressant medications:  Nortriptyline 10mg ; sertraline 50mg ; citalopram; fluoxetine; bupropion Past anticonvulsant medications:  topiramate 100mg  daily; levetiracetam 500mg  twice daily; lamotrigine 25mg  Past anti-CGRP:   Aimovig (1 dose; ineffective); Emgality (1 1/2 months; ineffective); Ajovy (  effective but insurance wouldn't cover it) Past vitamins/Herbal/Supplements:  none Past antihistamines/decongestants:  none Other past therapies:  none She states that Botox is not an option because her neurosurgeon advised her that she cannot receive injections in the area of her surgery as she still has sensitivity to back of head.    Caffeine:  At least 6 cups of coffee a day Diet:  Ginger ale.  Water.  Tries to reduce salt intake. Exercise:  Tries to walk. Depression:  History of depression.  Doing well now; Anxiety:  Yes Other pain:  no Sleep hygiene:  Good. Family history of headache:  Mother (migraines); uncle (severe migraines)   Past Medical History: Past Medical History:  Diagnosis Date  . Acute appendicitis   . Anxiety   . 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 Bipolar disorder (HCC)   . CERVICAL SPASM 09/08/2007   Qualifier: Diagnosis of  By: Romeo Apple MD, Duffy Rhody    . Cervicalgia   . 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 Childhood asthma    seasonal with allergies  . Complication of anesthesia    hard to wake up post-op  . Depression   . Endometriosis 09/16/2012  . Family history of adverse reaction to anesthesia    pt's father has hx. of being hard to wake up post-op  . Headache   . History of Chiari malformation   . History of MRSA infection    axilla  . 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 Panic attacks   . PTSD (post-traumatic stress disorder)   . Shoulder dislocation 06/2015   right    Medications: Outpatient Encounter Medications as of 08/17/2019  Medication Sig  . amphetamine-dextroamphetamine (ADDERALL) 20 MG tablet Take 20 mg by mouth daily.   Marland Kitchen buPROPion (WELLBUTRIN SR) 100 MG 12 hr tablet Wellbutrin SR 100 mg tablet, 12 hr  sustained-release  Take 1 tablet every day by oral route.  . dicyclomine (BENTYL) 10 MG capsule Take 10 mg by mouth 4 (four) times daily -  before meals and at bedtime.  . Esomeprazole Magnesium (NEXIUM) 5 MG PACK Take 5 mg by mouth daily.  . famotidine (PEPCID) 20 MG tablet Take 1 tablet (20 mg total) by mouth 2 (two) times daily.  . metroNIDAZOLE (FLAGYL) 250 MG tablet Take 250 mg by mouth 3 (three) times daily.  . sucralfate (CARAFATE) 1 GM/10ML suspension Take 10 mLs (1 g total) by mouth 4 (four) times daily -  with meals and at bedtime.   No facility-administered encounter medications on file as of 08/17/2019.    Allergies: Allergies  Allergen Reactions  . Dilaudid [Hydromorphone Hcl] Shortness Of Breath  . Adhesive [Tape] Rash  . Morphine And Related Itching and Nausea And Vomiting    Have to give benadryl with morphine, also nausea/vomiting    Family History: Family History  Problem Relation Age of Onset  . Anesthesia problems Father        hard to wake up post-op  . Stroke Father     Social History: Social History   Socioeconomic History  . Marital status: Single    Spouse name: Not on file  . Number of children: 1  . Years of education: Not on file  . Highest education level: Not on file  Occupational History  . Occupation: Consulting civil engineer  Tobacco Use  . Smoking status: Current Every Day Smoker  Packs/day: 1.00    Years: 10.00    Pack years: 10.00    Types: Cigarettes  . Smokeless tobacco: Never Used  . Tobacco comment: quit 2 months ago  Substance and Sexual Activity  . Alcohol use: No    Comment: social  . Drug use: No  . Sexual activity: Yes    Birth control/protection: Surgical  Other Topics Concern  . Not on file  Social History Narrative   Lives at home w/ her mom and son   Current student   Right handed   One story home   Social Determinants of Health   Financial Resource Strain:   . Difficulty of Paying Living Expenses: Not on file  Food  Insecurity:   . Worried About Programme researcher, broadcasting/film/video in the Last Year: Not on file  . Ran Out of Food in the Last Year: Not on file  Transportation Needs:   . Lack of Transportation (Medical): Not on file  . Lack of Transportation (Non-Medical): Not on file  Physical Activity:   . Days of Exercise per Week: Not on file  . Minutes of Exercise per Session: Not on file  Stress:   . Feeling of Stress : Not on file  Social Connections:   . Frequency of Communication with Friends and Family: Not on file  . Frequency of Social Gatherings with Friends and Family: Not on file  . Attends Religious Services: Not on file  . Active Member of Clubs or Organizations: Not on file  . Attends Banker Meetings: Not on file  . Marital Status: Not on file  Intimate Partner Violence:   . Fear of Current or Ex-Partner: Not on file  . Emotionally Abused: Not on file  . Physically Abused: Not on file  . Sexually Abused: Not on file    Observations/Objective:   Height 5\' 7"  (1.702 m), weight 204 lb (92.5 kg), last menstrual period 08/18/2012. No acute distress.  Alert and oriented.  Speech fluent and not dysarthric.  Language intact.  Eyes orthophoric on primary gaze.  Face symmetric.  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 Migraine with aura, without status migrainosus, intractable History of Chiari 1 malformation s/p decompressive surgery.  She hasn't appropriately optimized Aimovig and Emgality.  We must give each medication a 4 month trial as studies have found efficacy increases with subsequent dosing.  1.  For preventative management, we will start Aimovig 140mg  monthly 2.  For abortive therapy, will give her samples of Ubrelvy 100mg .  She has failed multiple OTC analgesics and tramadol.  Triptans are contraindicated as she has hemiplegic migraines. 3.  Limit use of pain relievers to no more than 2 days out of week to prevent risk of rebound or medication-overuse  headache. 4.  Keep headache diary 5.  Exercise, hydration, caffeine cessation, sleep hygiene, monitor for and avoid triggers 6.  Follow up 4 months.   Follow Up Instructions:    -I discussed the assessment and treatment plan with the patient. The patient was provided an opportunity to ask questions and all were answered. The patient agreed with the plan and demonstrated an understanding of the instructions.   The patient was advised to call back or seek an in-person evaluation if the symptoms worsen or if the condition fails to improve as anticipated.    08/20/2012, DO

## 2019-08-17 ENCOUNTER — Encounter: Payer: Self-pay | Admitting: Neurology

## 2019-08-17 ENCOUNTER — Telehealth: Payer: Self-pay | Admitting: Neurology

## 2019-08-17 ENCOUNTER — Other Ambulatory Visit: Payer: Self-pay

## 2019-08-17 ENCOUNTER — Telehealth (INDEPENDENT_AMBULATORY_CARE_PROVIDER_SITE_OTHER): Payer: Managed Care, Other (non HMO) | Admitting: Neurology

## 2019-08-17 VITALS — Ht 67.0 in | Wt 204.0 lb

## 2019-08-17 DIAGNOSIS — G43119 Migraine with aura, intractable, without status migrainosus: Secondary | ICD-10-CM | POA: Diagnosis not present

## 2019-08-17 DIAGNOSIS — Z8669 Personal history of other diseases of the nervous system and sense organs: Secondary | ICD-10-CM

## 2019-08-17 DIAGNOSIS — G43419 Hemiplegic migraine, intractable, without status migrainosus: Secondary | ICD-10-CM | POA: Diagnosis not present

## 2019-08-17 MED ORDER — AIMOVIG 140 MG/ML ~~LOC~~ SOAJ: 140.0000 mg | SUBCUTANEOUS | 11 refills | Status: DC

## 2019-08-17 NOTE — Telephone Encounter (Signed)
Sent PA form to [facility] for patient Aimovig medication.

## 2019-08-27 NOTE — Telephone Encounter (Addendum)
Called [place] [facility] to check on PA determination 08/26/2019 Call interaction ID [id number] [place] said it was denied due to pregnancy status and the classes of drugs tried needs to be listed each one. I filled out another form with these requirements and faxed to [facility] with the latest clinical notes from Dr. Everlena Cooper and faxed them 08/27/2019 Will need to call [place] tracks on Monday 08/30/2019 for determination.    Called 08/30/2019 spoke with Ita she said it had already been closed so she initiated a new one must call 08/31/19 to get outcome. I shared this with [facility] and we will call for determination tomorrow.

## 2019-08-31 NOTE — Telephone Encounter (Signed)
Called [place] [facility]- patient is approved for the Aimovig medication effective 08/30/19 to 11/28/19. PA#: [id number]. Ref call #: [id number].

## 2019-12-23 NOTE — Progress Notes (Deleted)
NEUROLOGY FOLLOW UP OFFICE NOTE  [name] [id number]  HISTORY OF PRESENT ILLNESS: [name] is a 34 year old female with ADD, PTSD, tobacco use disorder and history of Chiari malformation status post surgery 08/08/2016 who follows up for migraines.  UPDATE: Intensity:  *** Duration:  *** Frequency:  *** Frequency of abortive medication: *** Current NSAIDS:  Ibuprofen Current analgesics:  none Current triptans:  none Current ergotamine:  none Current anti-emetic:  none Current muscle relaxants:  none Current anti-anxiolytic:  none Current sleep aide:  none Current Antihypertensive medications:  none Current Antidepressant medications:  none Current Anticonvulsant medications:  none Current anti-CGRP:  Aimovig 140mg ; Ubrelvy 100mg  Current Vitamins/Herbal/Supplements:  none Current Antihistamines/Decongestants:  none Other therapy:  none Hormone/birth control:  none Other medications:  Adderall  Caffeine:  At least 6 cups of coffee a day Diet:  Ginger ale.  Water.  Tries to reduce salt intake. Exercise:  Tries to walk. Depression:  History of depression.  Doing well now; Anxiety:  Yes Other pain:  no Sleep hygiene:  Good.  HISTORY: Onset:  She has had infrequent migraines since she was a teenager.  They worsened after surgery for Chiari malformation in February 2018. Location:  Top of head, sometimes associated neck pain.  Sometimes preceded by jolts of pain in head. Quality:  Pounding, throbbing, pressure Initial intensity:  Severe.  She denies new headache, thunderclap headache or severe headache that wakes her from sleep. Aura:  Right sided facial sensory disturbance (not numb or tingling) and right sided upper and lower facial weakness. Premonitory Phase:  none Postdrome:  Feels washed out for a day Associated symptoms:  Right facial weakness and altered sensory disturbance as well as clumsiness and altered sensory disturbance of her right hand.  Photophobia, phonophobia.  Rarely nausea.  No vomiting. Initial duration:  2 days (first day the worst).  May occur up to 7 days.  Right facial droop lasts 24 hours or up to 48 hours for more severe) Initial frequency:  Since stopping Ajovy, mild to moderate occur 2 to 3 times a week; about 2 severe migraines a month. Initial frequency of abortive medication: 2 to 3 days a week ibuprofen at most. Triggers:  Emotional stress Exacerbating factors:  Bending over Relieving factors:  nothing Activity:  Cannot function when severe.  02/13/2017 MRI BRAIN W WO (personally reviewed):  Status post suboccipital decompressive craniectomy; otherwise unremarkable. 11/10/2016 CT HEAD WO (personally reviewed):  Status post suboccipital decompressive craniectomy; otherwise, unremarkable.   Past NSAIDS:  Diclofenac 50mg  EC;  meloxicam Past analgesics:  Tylenol #3; tramadol; Tylenol; Excedrin Migraine Past abortive triptans:  none Past abortive ergotamine:  none Past muscle relaxants:  Methocarbamol; tizanidine Past anti-emetic:  Promethazine 25mg ; procholorperzine; ondansetron 4mg  Past antihypertensive medications:  propranolol Past antidepressant medications:  Nortriptyline 10mg ; sertraline 50mg ; citalopram; fluoxetine; bupropion Past anticonvulsant medications:  topiramate 100mg  daily; levetiracetam 500mg  twice daily; lamotrigine 25mg  Past anti-CGRP:  Aimovig (1 dose; ineffective); Emgality (1 1/2 months; ineffective); Ajovy (effective but insurance wouldn't cover it) Past vitamins/Herbal/Supplements:  none Past antihistamines/decongestants:  none Other past therapies:  none She states that Botox is not an option because her neurosurgeon advised her that she cannot receive injections in the area of her surgery as she still has sensitivity to back of head.     Family history of headache:  Mother (migraines); uncle (severe migraines)  PAST MEDICAL HISTORY: Past Medical History:  Diagnosis Date  .  Acute appendicitis   . Anxiety   .  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 Bipolar disorder (HCC)   . CERVICAL SPASM 09/08/2007   Qualifier: Diagnosis of  By: Romeo Apple MD, Duffy Rhody    . Cervicalgia   . 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 Childhood asthma    seasonal with allergies  . Complication of anesthesia    hard to wake up post-op  . Depression   . Endometriosis 09/16/2012  . Family history of adverse reaction to anesthesia    pt's father has hx. of being hard to wake up post-op  . Headache   . History of Chiari malformation   . History of MRSA infection    axilla  . 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 Panic attacks   . PTSD (post-traumatic stress disorder)   . Shoulder dislocation 06/2015   right    MEDICATIONS: Current Outpatient Medications on File Prior to Visit  Medication Sig Dispense Refill  . amphetamine-dextroamphetamine (ADDERALL) 20 MG tablet Take 20 mg by mouth daily.     Marland Kitchen buPROPion (WELLBUTRIN SR) 100 MG 12 hr tablet Wellbutrin SR 100 mg tablet, 12 hr sustained-release  Take 1 tablet every day by oral route.    . dicyclomine (BENTYL) 10 MG capsule Take 10 mg by mouth 4 (four) times daily -  before meals and at bedtime.    Dorise Hiss (AIMOVIG) 140 MG/ML SOAJ Inject 140 mg into the skin every 30 (thirty) days. 1 pen 11  . Esomeprazole Magnesium (NEXIUM) 5 MG PACK Take 5 mg by mouth daily. 120 each 2  . famotidine (PEPCID) 20 MG tablet Take 1 tablet (20 mg total) by mouth 2 (two) times daily. (Patient not taking: Reported on 08/16/2019) 30 tablet 0  . metroNIDAZOLE (FLAGYL) 250 MG tablet Take 250 mg by mouth 3 (three) times daily.    . sucralfate (CARAFATE) 1 GM/10ML suspension Take 10 mLs (1 g total) by mouth 4 (four) times daily -  with meals and at bedtime. (Patient not taking: Reported on 08/16/2019) 420 mL 0   No  current facility-administered medications on file prior to visit.    ALLERGIES: Allergies  Allergen Reactions  . Dilaudid [Hydromorphone Hcl] Shortness Of Breath  . Adhesive [Tape] Rash  . Morphine And Related Itching and Nausea And Vomiting    Have to give benadryl with morphine, also nausea/vomiting    FAMILY HISTORY: Family History  Problem Relation Age of Onset  . Anesthesia problems Father        hard to wake up post-op  . Stroke Father    ***.  SOCIAL HISTORY: Social History   Socioeconomic History  . Marital status: Single    Spouse name: Not on file  . Number of children: 1  . Years of education: Not on file  . Highest education level: Not on file  Occupational History  . Occupation: Consulting civil engineer  Tobacco Use  . Smoking status: Current Every Day Smoker    Packs/day: 1.00    Years: 10.00    Pack years: 10.00    Types: Cigarettes  . Smokeless tobacco: Never Used  . Tobacco comment: quit 2 months ago  Vaping Use  . Vaping Use: Never used  Substance and Sexual Activity  . Alcohol use: No    Comment: social  . Drug use: No  . Sexual activity: Yes    Birth control/protection: Surgical  Other Topics Concern  . Not on  file  Social History Narrative   Lives at home w/ her mom and son   Current student   Right handed   One story home   Social Determinants of Health   Financial Resource Strain:   . Difficulty of Paying Living Expenses:   Food Insecurity:   . Worried About Programme researcher, broadcasting/film/video in the Last Year:   . Barista in the Last Year:   Transportation Needs:   . Freight forwarder (Medical):   Marland Kitchen Lack of Transportation (Non-Medical):   Physical Activity:   . Days of Exercise per Week:   . Minutes of Exercise per Session:   Stress:   . Feeling of Stress :   Social Connections:   . Frequency of Communication with Friends and Family:   . Frequency of Social Gatherings with Friends and Family:   . Attends Religious Services:   . Active  Member of Clubs or Organizations:   . Attends Banker Meetings:   Marland Kitchen Marital Status:   Intimate Partner Violence:   . Fear of Current or Ex-Partner:   . Emotionally Abused:   Marland Kitchen Physically Abused:   . Sexually Abused:     PHYSICAL EXAM: *** General: No acute distress.  Patient appears well-groomed.   Head:  Normocephalic/atraumatic Eyes:  Fundi examined but not visualized Neck: supple, no paraspinal tenderness, full range of motion Heart:  Regular rate and rhythm Lungs:  Clear to auscultation bilaterally Back: No paraspinal tenderness Neurological Exam: alert and oriented to person, place, and time. Attention span and concentration intact, recent and remote memory intact, fund of knowledge intact.  Speech fluent and not dysarthric, language intact.  CN II-XII intact. Bulk and tone normal, muscle strength 5/5 throughout.  Sensation to light touch, temperature and vibration intact.  Deep tendon reflexes 2+ throughout, toes downgoing.  Finger to nose and heel to shin testing intact.  Gait normal, Romberg negative.  IMPRESSION: 1.  Migraine with aura, without status migrainosus, not intractable 2.  Hemiplegic migraine 3.  History of Chiari I malformation s/p decompressive surgery  PLAN: 1.  For preventative management, *** 2.  For abortive therapy, *** 3.  Limit use of pain relievers to no more than 2 days out of week to prevent risk of rebound or medication-overuse headache. 4.  Keep headache diary 5.  Exercise, hydration, caffeine cessation, sleep hygiene, monitor for and avoid triggers 6.  Follow up ***   Shon Millet, DO  CC: ***

## 2019-12-24 ENCOUNTER — Ambulatory Visit: Payer: Medicaid Other | Admitting: Neurology

## 2020-01-31 ENCOUNTER — Other Ambulatory Visit: Payer: Self-pay

## 2020-01-31 ENCOUNTER — Telehealth: Payer: Self-pay | Admitting: Neurology

## 2020-01-31 MED ORDER — PREDNISONE 10 MG (21) PO TBPK: ORAL_TABLET | ORAL | 0 refills | Status: DC

## 2020-01-31 NOTE — Telephone Encounter (Signed)
Patient reports she's on the 5th day of a migraine. Centralized on her forehead and the left side of the back of her head. Took emergency medicine 2 days ago, only eased for about 2 hours. Took second one later that evening (about 8 hours after the first one), didn't help. Patient says her neck, jaw and ear are starting to hurt. Please call.

## 2020-01-31 NOTE — Telephone Encounter (Addendum)
Samples of this drug were given to the patient, quantity 2 bx Lot Number [id number] 10/22  Prednisone taper sent to pt pharmacy

## 2020-01-31 NOTE — Telephone Encounter (Signed)
We can prescribe her a prednisone taper to break intractable headache (10mg  tablet:  60mg  on day 1, then 50mg  on day 2, then 40mg  on day 3, then 30mg  on day 4, then 20mg  on day 5, then 10mg  on day 6, then STOP).  When she said she tried the rescue medication, is she talking about the ?  If so, then she can come by the office to pick up samples of Nurtec.

## 2020-03-28 ENCOUNTER — Emergency Department (HOSPITAL_COMMUNITY): Payer: Managed Care, Other (non HMO)

## 2020-03-28 ENCOUNTER — Telehealth: Payer: Self-pay | Admitting: *Deleted

## 2020-03-28 ENCOUNTER — Encounter (HOSPITAL_COMMUNITY): Payer: Self-pay | Admitting: *Deleted

## 2020-03-28 ENCOUNTER — Emergency Department (HOSPITAL_COMMUNITY)
Admission: EM | Admit: 2020-03-28 | Discharge: 2020-03-28 | Disposition: A | Discharge status: Home / Self Care | Payer: Managed Care, Other (non HMO) | Attending: Emergency Medicine | Admitting: Emergency Medicine

## 2020-03-28 ENCOUNTER — Other Ambulatory Visit: Payer: Self-pay

## 2020-03-28 DIAGNOSIS — Z5321 Procedure and treatment not carried out due to patient leaving prior to being seen by health care provider: Secondary | ICD-10-CM | POA: Diagnosis not present

## 2020-03-28 DIAGNOSIS — R439 Unspecified disturbances of smell and taste: Secondary | ICD-10-CM | POA: Diagnosis not present

## 2020-03-28 DIAGNOSIS — R079 Chest pain, unspecified: Secondary | ICD-10-CM | POA: Diagnosis not present

## 2020-03-28 DIAGNOSIS — R0602 Shortness of breath: Secondary | ICD-10-CM | POA: Insufficient documentation

## 2020-03-28 DIAGNOSIS — Z20822 Contact with and (suspected) exposure to covid-19: Secondary | ICD-10-CM | POA: Diagnosis not present

## 2020-03-28 DIAGNOSIS — R05 Cough: Secondary | ICD-10-CM | POA: Diagnosis not present

## 2020-03-28 DIAGNOSIS — R519 Headache, unspecified: Secondary | ICD-10-CM | POA: Insufficient documentation

## 2020-03-28 DIAGNOSIS — M791 Myalgia, unspecified site: Secondary | ICD-10-CM | POA: Insufficient documentation

## 2020-03-28 NOTE — Telephone Encounter (Signed)
Patient called to check on her COVID-19 test results.  This RN informed patient that her test is still in process.  Patient verbalized understanding.  Patient also wanted to know if her test results would be released to her MyChart.  I assured her the results would be available to her in her MyChart once results are final.  Patient had no further questions or concerns.

## 2020-03-28 NOTE — ED Triage Notes (Addendum)
Pt c/o mid chest pain, chest burning with coughing, difficulty breathing, temp of 100.2, loss of taste, headache, bodyaches x 1 week.

## 2020-03-29 LAB — RESPIRATORY PANEL BY RT PCR (FLU A&B, COVID)
Influenza A by PCR: NEGATIVE
Influenza B by PCR: NEGATIVE
SARS Coronavirus 2 by RT PCR: NEGATIVE

## 2020-04-17 NOTE — Progress Notes (Signed)
NEUROLOGY FOLLOW UP OFFICE NOTE  [name] [id number]  HISTORY OF PRESENT ILLNESS: [name] is a 34 year old female with ADD, PTSD, tobacco use disorder and history of Chiari malformation status post surgery 08/08/2016 who follows up for hemiplegic migraine.  UPDATE: She was treated with prednisone for intractable headache in August.  She thinks it was triggered by potential tooth infection.    She has not had as many lately, but also less stress.  When she feels then come on, she takes [place] immediately, she has relief in 1 hour but has a mild headache for the entire day at 5-6/10.  She has had 12 headache days in last month and not as severe.  Frequency of abortive medication: rarely takes Advil Current NSAIDS/analgesics:  Advil Current triptans:  none Current ergotamine:  none Current anti-emetic:  none Current muscle relaxants:  none Current Antihypertensive medications:  none Current Antidepressant medications:  none Current Anticonvulsant medications:  none Current anti-CGRP:  Aimovig 70mg , Ubrelvy 100mg  Current Vitamins/Herbal/Supplements:  none Current Antihistamines/Decongestants:  none Other therapy:  none Other medication:  Adderall  Caffeine:  At least 6 cups of coffee a day Diet:  Ginger ale.  Water.  Tries to reduce salt intake. Exercise:  Tries to walk. Depression:  History of depression.  Doing well now; Anxiety:  Yes Other pain:  no Sleep hygiene:  Good.  HISTORY: Onset:  She has had infrequent migraines since she was a teenager.  They worsened after surgery for Chiari malformation in February 2018. Location:  Top of head, sometimes associated neck pain.  Sometimes preceded by jolts of pain in head. Quality:  Pounding, throbbing, pressure Initial intensity:  Severe.  She denies new headache, thunderclap headache or severe headache that wakes her from sleep. Aura:  Right sided facial sensory disturbance (not numb or tingling) and right  sided upper and lower facial weakness. Premonitory Phase:  none Postdrome:  Feels washed out for a day Associated symptoms:  Right facial weakness and altered sensory disturbance as well as clumsiness and altered sensory disturbance of her right hand. Photophobia, phonophobia.  Rarely nausea.  No vomiting. Initial Duration:  2 days (first day the worst).  May occur up to 7 days.  Right facial droop lasts 24 hours or up to 48 hours for more severe) Initial Frequency:  Since stopping Ajovy, mild to moderate occur 2 to 3 times a week; about 2 severe migraines a month. Initial Frequency of abortive medication: 2 to 3 days a week ibuprofen at most. Triggers:  Emotional stress, neck tension Exacerbating factors:  Bending over Relieving factors:  nothing Activity:  Cannot function when severe.  02/13/2017 MRI BRAIN W WO (personally reviewed):  Status post suboccipital decompressive craniectomy; otherwise unremarkable. 11/10/2016 CT HEAD WO (personally reviewed):  Status post suboccipital decompressive craniectomy; otherwise, unremarkable.  Past NSAIDS:  Diclofenac 50mg  EC;  meloxicam Past analgesics:  Tylenol #3; tramadol; Tylenol; Excedrin Migraine Past abortive triptans:  none Past abortive ergotamine:  none Past muscle relaxants:  Methocarbamol; tizanidine Past anti-emetic:  Promethazine 25mg ; procholorperzine; ondansetron 4mg  Past antihypertensive medications:  propranolol Past antidepressant medications:  Nortriptyline 10mg ; sertraline 50mg ; citalopram; fluoxetine; bupropion Past anticonvulsant medications:  topiramate 100mg  daily; levetiracetam 500mg  twice daily; lamotrigine 25mg  Past anti-CGRP:  Emgality (1 1/2 months; ineffective but did not tolerate injection); Ajovy (effective but insurance wouldn't cover it), Nurtec (ineffective for rescue) Past vitamins/Herbal/Supplements:  none Past antihistamines/decongestants:  none Other past therapies:  none She states that Botox is not  an  option because her neurosurgeon advised her that she cannot receive injections in the area of her surgery as she still has sensitivity to back of head.    Family history of headache:  Mother (migraines); uncle (severe migraines)  PAST MEDICAL HISTORY: Past Medical History:  Diagnosis Date  . Acute appendicitis   . Anxiety   . 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 CERVICAL SPASM 09/08/2007   Qualifier: Diagnosis of  By: Romeo Apple MD, Duffy Rhody    . Cervicalgia   . 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 Childhood asthma    seasonal with allergies  . Complication of anesthesia    hard to wake up post-op  . Depression   . Endometriosis 09/16/2012  . Family history of adverse reaction to anesthesia    pt's father has hx. of being hard to wake up post-op  . Headache   . History of Chiari malformation   . History of MRSA infection    axilla  . 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 Panic attacks   . PTSD (post-traumatic stress disorder)   . Shoulder dislocation 06/2015   right    MEDICATIONS: Current Outpatient Medications on File Prior to Visit  Medication Sig Dispense Refill  . amphetamine-dextroamphetamine (ADDERALL) 20 MG tablet Take 20 mg by mouth daily.     Marland Kitchen buPROPion (WELLBUTRIN SR) 100 MG 12 hr tablet Wellbutrin SR 100 mg tablet, 12 hr sustained-release  Take 1 tablet every day by oral route.    . dicyclomine (BENTYL) 10 MG capsule Take 10 mg by mouth 4 (four) times daily -  before meals and at bedtime.    Dorise Hiss (AIMOVIG) 140 MG/ML SOAJ Inject 140 mg into the skin every 30 (thirty) days. 1 pen 11  . Esomeprazole Magnesium (NEXIUM) 5 MG PACK Take 5 mg by mouth daily. 120 each 2  . famotidine (PEPCID) 20 MG tablet Take 1 tablet (20 mg total) by mouth 2 (two) times daily. (Patient not taking: Reported on 08/16/2019) 30 tablet 0  .  metroNIDAZOLE (FLAGYL) 250 MG tablet Take 250 mg by mouth 3 (three) times daily.    . predniSONE (STERAPRED UNI-PAK 21 TAB) 10 MG (21) TBPK tablet Take :  60mg  on day 1, then 50mg  on day 2, then 40mg  on day 3, then 30mg  on day 4, then 20mg  on day 5, then 10mg  on day 6, then STOP). 21 tablet 0  . sucralfate (CARAFATE) 1 GM/10ML suspension Take 10 mLs (1 g total) by mouth 4 (four) times daily -  with meals and at bedtime. (Patient not taking: Reported on 08/16/2019) 420 mL 0   No current facility-administered medications on file prior to visit.    ALLERGIES: Allergies  Allergen Reactions  . Dilaudid [Hydromorphone Hcl] Shortness Of Breath  . Adhesive [Tape] Rash  . Morphine And Related Itching and Nausea And Vomiting    Have to give benadryl with morphine, also nausea/vomiting    FAMILY HISTORY: Family History  Problem Relation Age of Onset  . Anesthesia problems Father        hard to wake up post-op  . Stroke Father     SOCIAL HISTORY: Social History   Socioeconomic History  . Marital status: Single    Spouse name: Not on file  . Number of children: 1  . Years of education: Not on file  . Highest education level:  Not on file  Occupational History  . Occupation: Consulting civil engineer  Tobacco Use  . Smoking status: Current Every Day Smoker    Packs/day: 1.00    Years: 10.00    Pack years: 10.00    Types: Cigarettes  . Smokeless tobacco: Never Used  . Tobacco comment: quit 2 months ago  Vaping Use  . Vaping Use: Never used  Substance and Sexual Activity  . Alcohol use: Yes    Comment: occasionally   . Drug use: No  . Sexual activity: Yes    Birth control/protection: Surgical  Other Topics Concern  . Not on file  Social History Narrative   Lives at home w/ her mom and son   Current student   Right handed   One story home   Social Determinants of Health   Financial Resource Strain:   . Difficulty of Paying Living Expenses: Not on file  Food Insecurity:   . Worried About  Programme researcher, broadcasting/film/video in the Last Year: Not on file  . Ran Out of Food in the Last Year: Not on file  Transportation Needs:   . Lack of Transportation (Medical): Not on file  . Lack of Transportation (Non-Medical): Not on file  Physical Activity:   . Days of Exercise per Week: Not on file  . Minutes of Exercise per Session: Not on file  Stress:   . Feeling of Stress : Not on file  Social Connections:   . Frequency of Communication with Friends and Family: Not on file  . Frequency of Social Gatherings with Friends and Family: Not on file  . Attends Religious Services: Not on file  . Active Member of Clubs or Organizations: Not on file  . Attends Banker Meetings: Not on file  . Marital Status: Not on file  Intimate Partner Violence:   . Fear of Current or Ex-Partner: Not on file  . Emotionally Abused: Not on file  . Physically Abused: Not on file  . Sexually Abused: Not on file   PHYSICAL EXAM: Blood pressure 126/82, pulse 82, height 5\' 7"  (1.702 m), weight 204 lb 3.2 oz (92.6 kg), last menstrual period 08/18/2012, SpO2 100 %. General: No acute distress.  Patient appears well-groomed.    IMPRESSION: Migraine with aura, without status migrainosus, not intractable/hemiplegic migraine.  PLAN: 1.  To obtain better migraine control, will restart Ajovy.  She has failed Aimovig (been on 140mg  for several months) and did not tolerate injection of Emgality.  Ajovy previously effective. 2.  For rescue, will have her try Reyvow 100mg .  Advised not to drive for 8 hours.  Triptans are contraindicated.  Nurtec ineffective.  Ubrelvy helpful but headache may persist up to 2 days. 3.  Limit use of pain relievers to no more than 2 days out of week to prevent risk of rebound or medication-overuse headache. 4.  Keep headache diary 5.  Follow up 4 to 6 months.  08/20/2012, DO  CC: , MD

## 2020-04-18 ENCOUNTER — Encounter: Payer: Self-pay | Admitting: Neurology

## 2020-04-18 ENCOUNTER — Ambulatory Visit (INDEPENDENT_AMBULATORY_CARE_PROVIDER_SITE_OTHER): Payer: Managed Care, Other (non HMO) | Admitting: Neurology

## 2020-04-18 ENCOUNTER — Other Ambulatory Visit: Payer: Self-pay

## 2020-04-18 VITALS — BP 126/82 | HR 82 | Ht 67.0 in | Wt 204.2 lb

## 2020-04-18 DIAGNOSIS — G43119 Migraine with aura, intractable, without status migrainosus: Secondary | ICD-10-CM

## 2020-04-18 DIAGNOSIS — G43419 Hemiplegic migraine, intractable, without status migrainosus: Secondary | ICD-10-CM

## 2020-04-18 DIAGNOSIS — Z8669 Personal history of other diseases of the nervous system and sense organs: Secondary | ICD-10-CM

## 2020-04-18 MED ORDER — AJOVY 225 MG/1.5ML ~~LOC~~ SOAJ: 225.0000 mg | SUBCUTANEOUS | 11 refills | Status: DC

## 2020-04-18 NOTE — Patient Instructions (Signed)
1.  Failed Aimovig and Emgality.  Restart Ajovy every 28 days 2.  Try Reyvow for rescue.  No more than 1 tablet in 24 hours.  If effective, contact me for script.  Recommended not to drive for 8 hours after use (drowsiness) 3.  Follow up 4 to 6 months.

## 2020-04-18 NOTE — Progress Notes (Addendum)
Judee Clara Key: [id number] - PA Case ID: [id number] - Rx #: [id number] Need help? Call us at [phone] Archivedon October 25 Outcome Approvedon October 25 [id number];Review Type:Prior Auth;Coverage Start Date:04/18/2020;Coverage End Date:10/21/2020; Drug Ajovy 225MG /1.5ML syringes Form PA Form [id number] NCPDP) Original Claim Info 57

## 2020-05-01 ENCOUNTER — Telehealth: Payer: Self-pay | Admitting: Neurology

## 2020-05-01 ENCOUNTER — Telehealth: Payer: Self-pay

## 2020-05-01 NOTE — Telephone Encounter (Signed)
Telephone call from pt, Pt states while trying to injected Ajovy sample the injector malfunction. She was unable to get any of the medication. Per pt the medication squirted everywhere.    Lot [id number], exp 10/22 given to pt on 04/18/20 by the office.   Advise pt I will let Dr. Everlena Cooper know as well as the Ajovy Rep.    Per Colon Branch the [place] Rep, Please allow pt to get another sample and she will document for faulty injector.

## 2020-05-01 NOTE — Telephone Encounter (Signed)
Advised pt to ask for FMLA forms so that DR. Everlena Cooper fill it out. This form explains this as well as how much time the pt needs off during the Migraine.   Pt verbalized understanding  And will have job fax over the forms. Fax number for the office given to the pt.

## 2020-05-01 NOTE — Telephone Encounter (Signed)
Patient is requesting that Dr Everlena Cooper write a letter for her job explaining her medical condition and how it may affect her job. Please call when ready

## 2020-05-16 ENCOUNTER — Ambulatory Visit: Payer: Managed Care, Other (non HMO) | Admitting: Neurology

## 2020-05-16 NOTE — Telephone Encounter (Signed)
Pt ok to pick up samples. FYI  Pt will drop off FMLA forms for DR. Jaffe to fill out.

## 2020-07-06 DIAGNOSIS — F17210 Nicotine dependence, cigarettes, uncomplicated: Secondary | ICD-10-CM | POA: Diagnosis not present

## 2020-07-06 DIAGNOSIS — R591 Generalized enlarged lymph nodes: Secondary | ICD-10-CM | POA: Diagnosis not present

## 2020-08-19 ENCOUNTER — Encounter (HOSPITAL_COMMUNITY): Payer: Self-pay | Admitting: Emergency Medicine

## 2020-08-19 ENCOUNTER — Emergency Department (HOSPITAL_COMMUNITY)
Admission: EM | Admit: 2020-08-19 | Discharge: 2020-08-19 | Disposition: A | Discharge status: Home / Self Care | Payer: Medicaid Other | Attending: Emergency Medicine | Admitting: Emergency Medicine

## 2020-08-19 ENCOUNTER — Emergency Department (HOSPITAL_COMMUNITY): Payer: Medicaid Other

## 2020-08-19 ENCOUNTER — Other Ambulatory Visit: Payer: Self-pay

## 2020-08-19 DIAGNOSIS — R509 Fever, unspecified: Secondary | ICD-10-CM | POA: Diagnosis present

## 2020-08-19 DIAGNOSIS — F17210 Nicotine dependence, cigarettes, uncomplicated: Secondary | ICD-10-CM | POA: Insufficient documentation

## 2020-08-19 DIAGNOSIS — U071 COVID-19: Secondary | ICD-10-CM | POA: Diagnosis not present

## 2020-08-19 DIAGNOSIS — Z20822 Contact with and (suspected) exposure to covid-19: Secondary | ICD-10-CM

## 2020-08-19 DIAGNOSIS — J45909 Unspecified asthma, uncomplicated: Secondary | ICD-10-CM | POA: Diagnosis not present

## 2020-08-19 MED ORDER — IBUPROFEN 800 MG PO TABS: 800.0000 mg | ORAL_TABLET | Freq: Three times a day (TID) | ORAL | 0 refills | Status: DC

## 2020-08-19 MED ORDER — BENZONATATE 200 MG PO CAPS: 200.0000 mg | ORAL_CAPSULE | Freq: Three times a day (TID) | ORAL | 0 refills | Status: DC | PRN

## 2020-08-19 NOTE — ED Provider Notes (Signed)
[facility] EMERGENCY DEPARTMENT Provider Note   CSN: [id number] Arrival date & time: 08/19/20  1024     History Chief Complaint  Patient presents with  . Generalized Body Aches    [name] is a 35 y.o. female.  HPI      [name] is a 35 y.o. female with past medical history of Chiari malformation, who presents to the Emergency Department requesting evaluation for possible Covid exposure.  States that her mother recently tested positive for COVID-19.  She was tested 08/16/2020 at [facility].  Reports test was negative and she was asymptomatic at the time.  This morning, she woke up with generalized body aches, nasal congestion fever and chills.  Temp of 100.8 this morning and she took ibuprofen prior to arrival.  She also reports occasional cough which is mostly nonproductive.  She denies abdominal pain, nausea, vomiting, or diarrhea.  No chest pain or shortness of breath.  No aggravating or alleviating factors.  She is unvaccinated for Covid.   Past Medical History:  Diagnosis Date  . Acute appendicitis   . Anxiety   . 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 CERVICAL SPASM 09/08/2007   Qualifier: Diagnosis of  By: Romeo Apple MD, Duffy Rhody    . Cervicalgia   . 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 Childhood asthma    seasonal with allergies  . Complication of anesthesia    hard to wake up post-op  . Depression   . Endometriosis 09/16/2012  . Family history of adverse reaction to anesthesia    pt's father has hx. of being hard to wake up post-op  . Headache   . History of Chiari malformation   . History of MRSA infection    axilla  . 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 Panic attacks   . PTSD (post-traumatic stress disorder)   . Shoulder dislocation 06/2015   right    Patient Active Problem List   Diagnosis Date  Noted  . 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 Acute appendicitis   . 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 Endometriosis 09/16/2012  . CERVICALGIA 09/08/2007  . CERVICAL SPASM 09/08/2007    Past Surgical History:  Procedure Laterality Date  . BRAIN SURGERY    . CESAREAN SECTION  09/05/2008  . CYSTOSCOPY N/A 09/15/2012   Procedure: CYSTOSCOPY;  Surgeon: Leslie Andrea, MD;  Location: [facility];  Service: Gynecology;  Laterality: N/A;  . DILATION AND EVACUATION  04/15/2007  . HYSTEROSCOPY WITH D & C  03/31/2012   Procedure: DILATATION AND CURETTAGE /HYSTEROSCOPY;  Surgeon: Leslie Andrea, MD;  Location: [facility];  Service: Gynecology;  Laterality: N/A;  . LAPAROSCOPIC APPENDECTOMY N/A 10/29/2016   Procedure: APPENDECTOMY LAPAROSCOPIC;  Surgeon: Franky Macho, MD;  Location: [facility];  Service: General;  Laterality: N/A;  . LAPAROSCOPIC ASSISTED VAGINAL HYSTERECTOMY N/A 09/15/2012   Procedure: LAPAROSCOPIC ASSISTED VAGINAL HYSTERECTOMY;  Surgeon: Leslie Andrea, MD;  Location: [facility];  Service: Gynecology;  Laterality: N/A;  . LAPAROSCOPY  03/31/2012   Procedure: LAPAROSCOPY OPERATIVE;  Surgeon: Leslie Andrea, MD;  Location: [facility];  Service: Gynecology;  Laterality: N/A;  with Biopsies of Bilateral Fallopian Tubes; Fulguration of Endometriosis  . SHOULDER ARTHROSCOPY WITH CAPSULORRHAPHY Right 06/28/2015   Procedure: RIGHT SHOULDER ARTHROSCOPY WITH CAPSULORRHAPHY;  Surgeon: Frederico Hamman, MD;  Location: [facility];  Service: Orthopedics;  Laterality: Right;  . SUBOCCIPITAL CRANIECTOMY CERVICAL LAMINECTOMY N/A 08/08/2016   Procedure: SUBOCCIPITAL CRANIECTOMY CERVICAL LAMINECTOMY NUMBER DURAPLASTY;  Surgeon: Tressie StalkerJeffrey Jenkins, MD;  [facility];  Service: Neurosurgery;  Laterality: N/A;  suboccipital  . ULNAR NERVE REPAIR Right 05/2015     OB History    Gravida  2   Para  1   Term  1   Preterm      AB  1   Living   1     SAB  1   IAB      Ectopic      Multiple      Live Births              Family History  Problem Relation Age of Onset  . Anesthesia problems Father        hard to wake up post-op  . Stroke Father     Social History   Tobacco Use  . Smoking status: Current Every Day Smoker    Packs/day: 1.00    Years: 10.00    Pack years: 10.00    Types: Cigarettes  . Smokeless tobacco: Never Used  . Tobacco comment: quit 2 months ago  Vaping Use  . Vaping Use: Never used  Substance Use Topics  . Alcohol use: Yes    Comment: occasionally   . Drug use: No    Home Medications Prior to Admission medications   Medication Sig Start Date End Date Taking? Authorizing Provider  amphetamine-dextroamphetamine (ADDERALL) 20 MG tablet Take 20 mg by mouth daily.     [provider]  buPROPion (WELLBUTRIN SR) 100 MG 12 hr tablet Wellbutrin SR 100 mg tablet, 12 hr sustained-release  Take 1 tablet every day by oral route. Patient not taking: Reported on 04/18/2020    [provider]  dicyclomine (BENTYL) 10 MG capsule Take 10 mg by mouth 4 (four) times daily -  before meals and at bedtime. Patient not taking: Reported on 04/18/2020    [provider]  Esomeprazole Magnesium (NEXIUM) 5 MG PACK Take 5 mg by mouth daily. 12/22/18   Gilda CreasePollina, Christopher J, MD  famotidine (PEPCID) 20 MG tablet Take 1 tablet (20 mg total) by mouth 2 (two) times daily. Patient not taking: Reported on 04/18/2020 12/16/18   Vanetta MuldersZackowski, Scott, MD  Fremanezumab-vfrm (AJOVY) 225 MG/1.5ML SOAJ Inject 225 mg into the skin every 28 (twenty-eight) days. 04/18/20   Drema DallasJaffe, Adam R, DO  metroNIDAZOLE (FLAGYL) 250 MG tablet Take 250 mg by mouth 3 (three) times daily. Patient not taking: Reported on 04/18/2020    [provider]  sucralfate (CARAFATE) 1 GM/10ML suspension Take 10 mLs (1 g total) by mouth 4 (four) times daily -  with meals and at bedtime. Patient not taking: Reported on  04/18/2020 12/22/18   Gilda CreasePollina, Christopher J, MD    Allergies    Dilaudid [hydromorphone hcl], Adhesive [tape], and Morphine and related  Review of Systems   Review of Systems  Constitutional: Positive for fever. Negative for appetite change, chills and fatigue.  HENT: Positive for congestion. Negative for sore throat and trouble swallowing.   Respiratory: Positive for cough. Negative for shortness of breath and wheezing.   Cardiovascular: Negative for chest pain and leg swelling.  Gastrointestinal: Negative for abdominal pain, diarrhea, nausea and vomiting.  Genitourinary: Negative for dysuria, flank pain and hematuria.  Musculoskeletal: Positive for myalgias. Negative for arthralgias, back pain, neck pain and neck stiffness.  Skin: Negative for rash.  Neurological: Negative for dizziness, weakness, numbness and headaches.  Hematological: Does not bruise/bleed easily.    Physical Exam Updated Vital Signs BP 131/79 (BP Location: Right Arm)   Pulse (!) 110   Temp 98.1 F (36.7 C) (Oral)   Resp 20   LMP 08/18/2012   SpO2 100%   Physical Exam Vitals and nursing note reviewed.  Constitutional:      Appearance: She is not ill-appearing.  Eyes:     Conjunctiva/sclera: Conjunctivae normal.  Cardiovascular:     Rate and Rhythm: Normal rate and regular rhythm.     Pulses: Normal pulses.  Pulmonary:     Effort: Pulmonary effort is normal. No respiratory distress.     Breath sounds: No wheezing.  Chest:     Chest wall: No tenderness.  Abdominal:     Palpations: Abdomen is soft.     Tenderness: There is no abdominal tenderness.  Musculoskeletal:     Right lower leg: No edema.     Left lower leg: No edema.  Skin:    General: Skin is warm.     Capillary Refill: Capillary refill takes less than 2 seconds.     Findings: No rash.  Neurological:     General: No focal deficit present.     Mental Status: She is alert.     Sensory: No sensory deficit.     Motor: No weakness.      ED Results / Procedures / Treatments   Labs (all labs ordered are listed, but only abnormal results are displayed) Labs Reviewed  SARS CORONAVIRUS 2 (TAT 6-24 HRS)    EKG None  Radiology DG Chest Portable 1 View  Result Date: 08/19/2020 CLINICAL DATA:  Cough and fever.  COVID exposure on Monday. EXAM: PORTABLE CHEST 1 VIEW COMPARISON:  03/28/2020 FINDINGS: The heart size and mediastinal contours are within normal limits. Both lungs are clear. The visualized skeletal structures are unremarkable. IMPRESSION: No active disease. Electronically Signed   By: Signa Kell [name]   On: 08/19/2020 11:43    Procedures Procedures   Medications Ordered in ED Medications - No data to display  ED Course  I have reviewed the triage vital signs and the nursing notes.  Pertinent labs & imaging results that were available during my care of the patient were reviewed by me and considered in my medical decision making (see chart for details).    MDM Rules/Calculators/A&P                          Patient here with symptoms concerning for COVID-19.  Admits to recent exposure.  She is unvaccinated.  She had negative test 3 days ago and developed symptoms this morning.  She is requesting to be retested.  Overall, her exam is reassuring.  She is nontoxic-appearing. Vitals reassuring.    Chest x-ray without acute finding.  I feel that patient is appropriate for discharge home.  Isolation precautions discussed.  She agrees to symptomatic treatment with prescription for ibuprofen and antitussive.  Also discussed possible outpatient therapies if she is positive and she is agreeable.  Referral for ambulatory Covid treatment given and patient understands that she will be notified if test results are positive.   Final Clinical Impression(s) / ED Diagnoses Final diagnoses:  Exposure to COVID-19 virus    Rx / DC Orders ED Discharge Orders    None       Pauline Aus, PA-C 08/19/20  [address]     [place], [place] 08/19/20 1604

## 2020-08-19 NOTE — Discharge Instructions (Addendum)
Your Covid test today is pending.  Your chest x-ray was reassuring.  The covid results should be back later this evening or tomorrow.  Please isolate at home until your results are back.  If positive, you will be contacted by someone from the hospital.  You may also review your results on the MyChart app.  You have been given referral information for the Covid clinic in [place].  Someone from the clinic will contact you if your Covid test results are positive.  Return to the emergency department if you develop any worsening symptoms such as chest pain or shortness of breath.

## 2020-08-19 NOTE — ED Triage Notes (Signed)
Pt had COVID exposure on Monday. Pt reports she has body aches, cough, and fever.

## 2020-08-20 ENCOUNTER — Telehealth: Payer: Self-pay | Admitting: Unknown Physician Specialty

## 2020-08-20 LAB — SARS CORONAVIRUS 2 (TAT 6-24 HRS): SARS Coronavirus 2: POSITIVE — AB

## 2020-08-20 NOTE — Telephone Encounter (Signed)
Called to discuss with patient about COVID-19 symptoms and the use of one of the available treatments for those with mild to moderate Covid symptoms and at a high risk of hospitalization.  Pt appears to qualify for outpatient treatment due to co-morbid conditions and/or a member of an at-risk group in accordance with the FDA Emergency Use Authorization.    Symptom onset: 2/18 Vaccinated: no Immunocompromised? no Qualifiers: obesity  Unable to reach pt - mailbox is full  [employer]

## 2020-08-21 ENCOUNTER — Ambulatory Visit (HOSPITAL_COMMUNITY)
Admission: RE | Admit: 2020-08-21 | Discharge: 2020-08-21 | Disposition: A | Discharge status: Home / Self Care | Payer: Medicaid Other | Source: Ambulatory Visit | Attending: Pulmonary Disease | Admitting: Pulmonary Disease

## 2020-08-21 ENCOUNTER — Other Ambulatory Visit: Payer: Self-pay | Admitting: Physician Assistant

## 2020-08-21 DIAGNOSIS — U071 COVID-19: Secondary | ICD-10-CM | POA: Diagnosis not present

## 2020-08-21 MED ORDER — EPINEPHRINE 0.3 MG/0.3ML IJ SOAJ: 0.3000 mg | Freq: Once | INTRAMUSCULAR | Status: DC | PRN

## 2020-08-21 MED ORDER — FAMOTIDINE IN NACL 20-0.9 MG/50ML-% IV SOLN: 20.0000 mg | Freq: Once | INTRAVENOUS | Status: DC | PRN

## 2020-08-21 MED ORDER — DIPHENHYDRAMINE HCL 50 MG/ML IJ SOLN: 50.0000 mg | Freq: Once | INTRAMUSCULAR | Status: DC | PRN

## 2020-08-21 MED ORDER — SODIUM CHLORIDE 0.9 % IV SOLN: INTRAVENOUS | Status: DC | PRN

## 2020-08-21 MED ORDER — SOTROVIMAB 500 MG/8ML IV SOLN
500.0000 mg | Freq: Once | INTRAVENOUS | Status: AC
  Administered 2020-08-21: 500 mg via INTRAVENOUS

## 2020-08-21 MED ORDER — METHYLPREDNISOLONE SODIUM SUCC 125 MG IJ SOLR: 125.0000 mg | Freq: Once | INTRAMUSCULAR | Status: DC | PRN

## 2020-08-21 MED ORDER — ALBUTEROL SULFATE HFA 108 (90 BASE) MCG/ACT IN AERS: 2.0000 | INHALATION_SPRAY | Freq: Once | RESPIRATORY_TRACT | Status: DC | PRN

## 2020-08-21 NOTE — Progress Notes (Signed)
Patient reviewed Fact Sheet for Patients, Parents, and Caregivers for Emergency Use Authorization (EUA) of sotrovimab for the Treatment of Coronavirus. Patient also reviewed and is agreeable to the estimated cost of treatment. Patient is agreeable to proceed.   

## 2020-08-21 NOTE — Progress Notes (Signed)
I connected by phone with [name] on 08/21/2020 at 10:09 AM to discuss the potential use of a new treatment for mild to moderate COVID-19 viral infection in non-hospitalized patients.  This patient is a 35 y.o. female that meets the FDA criteria for Emergency Use Authorization of COVID monoclonal antibody sotrovimab.  Has a (+) direct SARS-CoV-2 viral test result  Has mild or moderate COVID-19   Is NOT hospitalized due to COVID-19  Is within 10 days of symptom onset  Has at least one of the high risk factor(s) for progression to severe COVID-19 and/or hospitalization as defined in EUA.  Specific high risk criteria : BMI > 25 and Other high risk medical condition per CDC:  unvaccinated, elevated SVI   I have spoken and communicated the following to the patient or parent/caregiver regarding COVID monoclonal antibody treatment:  1. FDA has authorized the emergency use for the treatment of mild to moderate COVID-19 in adults and pediatric patients with positive results of direct SARS-CoV-2 viral testing who are 54 years of age and older weighing at least 40 kg, and who are at high risk for progressing to severe COVID-19 and/or hospitalization.  2. The significant known and potential risks and benefits of COVID monoclonal antibody, and the extent to which such potential risks and benefits are unknown.  3. Information on available alternative treatments and the risks and benefits of those alternatives, including clinical trials.  4. Patients treated with COVID monoclonal antibody should continue to self-isolate and use infection control measures (e.g., wear mask, isolate, social distance, avoid sharing personal items, clean and disinfect "high touch" surfaces, and frequent handwashing) according to CDC guidelines.   5. The patient or parent/caregiver has the option to accept or refuse COVID monoclonal antibody treatment.  After reviewing this information with the patient, the patient  has agreed to receive one of the available covid 19 monoclonal antibodies and will be provided an appropriate fact sheet prior to infusion.  Sx onset 2/18. Set up for infusion on 2/21 @ 11:30am. Directions given to [facility]. Pt is aware that insurance will be charged an infusion fee. Pt is unvaccinated.   Cline Crock 08/21/2020 10:09 AM

## 2020-08-21 NOTE — Progress Notes (Signed)
Diagnosis: COVID-19  Physician: Dr. Patrick Wright  Procedure: [facility] Med: Sotrovimab infusion - Provided patient with sotrovimab fact sheet for patients, parents, and caregivers prior to infusion.   Complications: No immediate complications noted  Discharge: Discharged home    

## 2020-08-21 NOTE — Discharge Instructions (Signed)

## 2020-09-20 ENCOUNTER — Other Ambulatory Visit: Payer: Self-pay

## 2020-09-20 ENCOUNTER — Other Ambulatory Visit: Payer: Self-pay | Admitting: Physician Assistant

## 2020-09-20 ENCOUNTER — Ambulatory Visit (HOSPITAL_COMMUNITY): Payer: Medicaid Other

## 2020-09-20 ENCOUNTER — Other Ambulatory Visit (HOSPITAL_COMMUNITY): Payer: Self-pay | Admitting: Physician Assistant

## 2020-09-20 ENCOUNTER — Ambulatory Visit: Payer: Medicaid Other

## 2020-09-20 ENCOUNTER — Ambulatory Visit (HOSPITAL_COMMUNITY)
Admission: RE | Admit: 2020-09-20 | Discharge: 2020-09-20 | Disposition: A | Discharge status: Home / Self Care | Payer: Medicaid Other | Source: Ambulatory Visit | Attending: Physician Assistant | Admitting: Physician Assistant

## 2020-09-20 DIAGNOSIS — M79661 Pain in right lower leg: Secondary | ICD-10-CM | POA: Insufficient documentation

## 2020-09-27 ENCOUNTER — Other Ambulatory Visit: Payer: Self-pay

## 2020-09-27 DIAGNOSIS — I83891 Varicose veins of right lower extremities with other complications: Secondary | ICD-10-CM

## 2020-10-05 ENCOUNTER — Encounter: Payer: Self-pay | Admitting: Vascular Surgery

## 2020-10-05 ENCOUNTER — Ambulatory Visit (INDEPENDENT_AMBULATORY_CARE_PROVIDER_SITE_OTHER): Payer: Medicaid Other | Admitting: Vascular Surgery

## 2020-10-05 ENCOUNTER — Other Ambulatory Visit: Payer: Self-pay

## 2020-10-05 ENCOUNTER — Ambulatory Visit (HOSPITAL_COMMUNITY)
Admission: RE | Admit: 2020-10-05 | Discharge: 2020-10-05 | Disposition: A | Discharge status: Home / Self Care | Payer: Medicaid Other | Source: Ambulatory Visit | Attending: Vascular Surgery | Admitting: Vascular Surgery

## 2020-10-05 VITALS — BP 116/84 | HR 105 | Temp 97.9°F | Resp 14 | Ht 67.0 in | Wt 183.0 lb

## 2020-10-05 DIAGNOSIS — I83891 Varicose veins of right lower extremities with other complications: Secondary | ICD-10-CM | POA: Diagnosis not present

## 2020-10-05 DIAGNOSIS — I83811 Varicose veins of right lower extremities with pain: Secondary | ICD-10-CM

## 2020-10-05 NOTE — Progress Notes (Addendum)
REASON FOR CONSULT:    Painful varicose veins of the right lower extremity.  The consult is requested by Clementeen Graham, PA  ASSESSMENT & PLAN:   PAINFUL VARICOSE VEINS OF THE RIGHT LOWER EXTREMITY: This patient has CEAP C1 venous disease with spider veins and reticular veins.  She has some deep venous reflux on the right but no significant superficial venous reflux.  We have discussed the importance of intermittent leg elevation and the proper positioning for this.  In addition I recommended a knee-high compression stocking with a gradient of 15 to 20 mmHg.  I have encouraged her to avoid prolonged sitting and standing.  We discussed importance of exercise specifically walking and water aerobics.  We discussed the importance of maintaining a healthy weight as central obesity especially increases lower extremity venous pressure.  If she did want to have sclerotherapy for the spider veins and reticular veins along her lateral right thigh and leg I think this would be reasonable.  The veins behind the knee are slightly large for this but I think it would be reasonable to try this.  Currently she is not a candidate for laser ablation of the saphenous vein as she has no significant superficial venous reflux.  I will be happy to see her back at any time if her symptoms progress.   Christop visither Edilia Bo, MD Office: [phone]   HPI:   [name] is a pleasant 35 y.o. female, who is referred with painful varicose veins of the right lower extremity.  I have reviewed the records from the referring office.  The patient was seen on 09/20/2020 with varicose veins of the right lower extremity.  Patient has a long history of varicose veins and these were becoming more painful.  She was sent for vascular consultation.  On my history, the patient has had some spider veins along the lateral aspect of her right thigh for many years.  However a month ago she began experiencing some pain associated with  these spider veins.  In addition she had some bruising.  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  Her symptoms are worse at the end of the day.  She describes aching throbbing and heaviness in her leg.  She said no previous venous procedures.  She said no previous history of DVT.  She has not been wearing compression stockings.  She does elevate her legs some which helps.  Past Medical History:  Diagnosis Date  . Acute appendicitis   . Anxiety   . 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 CERVICAL SPASM 09/08/2007   Qualifier: Diagnosis of  By: Romeo Apple MD, Duffy Rhody    . Cervicalgia   . 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 Childhood asthma    seasonal with allergies  . Complication of anesthesia    hard to wake up post-op  . Depression   . Endometriosis 09/16/2012  . Family history of adverse reaction to anesthesia    pt's father has hx. of being hard to wake up post-op  . Headache   . History of Chiari malformation   . History of MRSA infection    axilla  . 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 Panic attacks   . PTSD (post-traumatic stress disorder)   . Shoulder dislocation 06/2015   right    Family  History  Problem Relation Age of Onset  . Anesthesia problems Father        hard to wake up post-op  . Stroke Father   . Varicose Veins Mother   . Varicose Veins Maternal Grandmother     SOCIAL HISTORY: Social History   Socioeconomic History  . Marital status: Single    Spouse name: Not on file  . Number of children: 1  . Years of education: Not on file  . Highest education level: Not on file  Occupational History  . Occupation: Consulting civil engineer  Tobacco Use  . Smoking status: Current Every Day Smoker    Packs/day: 1.00    Years: 10.00    Pack years: 10.00    Types: Cigarettes  . Smokeless  tobacco: Never Used  . Tobacco comment: quit 2 months ago  Vaping Use  . Vaping Use: Never used  Substance and Sexual Activity  . Alcohol use: Yes    Comment: occasionally   . Drug use: No  . Sexual activity: Yes    Birth control/protection: Surgical  Other Topics Concern  . Not on file  Social History Narrative   Lives at home w/ her mom and son   Current student   Right handed   One story home   Social Determinants of Health   Financial Resource Strain: Not on file  Food Insecurity: Not on file  Transportation Needs: Not on file  Physical Activity: Not on file  Stress: Not on file  Social Connections: Not on file  Intimate Partner Violence: Not on file    Allergies  Allergen Reactions  . Ciprofloxacin Shortness Of Breath  . Dilaudid [Hydromorphone Hcl] Shortness Of Breath  . Morphine   . Morphine Sulfate Other (See Comments)  . Other Other (See Comments)  . Adhesive [Tape] Rash  . Amoxicillin Rash  . Morphine And Related Itching and Nausea And Vomiting    Have to give benadryl with morphine, also nausea/vomiting    Current Outpatient Medications  Medication Sig Dispense Refill  . amphetamine-dextroamphetamine (ADDERALL) 20 MG tablet Take 20 mg by mouth daily.     . Esomeprazole Magnesium (NEXIUM) 5 MG PACK Take 5 mg by mouth daily. 120 each 2  . Fremanezumab-vfrm (AJOVY) 225 MG/1.5ML SOAJ Inject 225 mg into the skin every 28 (twenty-eight) days. 1.68 mL 11  . sucralfate (CARAFATE) 1 GM/10ML suspension Take 10 mLs (1 g total) by mouth 4 (four) times daily -  with meals and at bedtime. 420 mL 0  . benzonatate (TESSALON) 200 MG capsule Take 1 capsule (200 mg total) by mouth 3 (three) times daily as needed for cough. Swallow whole, do not chew (Patient not taking: Reported on 10/05/2020) 21 capsule 0  . buPROPion (WELLBUTRIN SR) 100 MG 12 hr tablet Wellbutrin SR 100 mg tablet, 12 hr sustained-release  Take 1 tablet every day by oral route. (Patient not taking: No sig  reported)    . dicyclomine (BENTYL) 10 MG capsule Take 10 mg by mouth 4 (four) times daily -  before meals and at bedtime. (Patient not taking: No sig reported)    . famotidine (PEPCID) 20 MG tablet Take 1 tablet (20 mg total) by mouth 2 (two) times daily. (Patient not taking: No sig reported) 30 tablet 0  . ibuprofen (ADVIL) 800 MG tablet Take 1 tablet (800 mg total) by mouth 3 (three) times daily. (Patient not taking: Reported on 10/05/2020) 21 tablet 0  . metroNIDAZOLE (FLAGYL) 250 MG tablet Take 250 mg  by mouth 3 (three) times daily. (Patient not taking: No sig reported)     No current facility-administered medications for this visit.    REVIEW OF SYSTEMS:  [X]  denotes positive finding, [ ]  denotes negative finding Cardiac  Comments:  Chest pain or chest pressure:    Shortness of breath upon exertion:    Short of breath when lying flat:    Irregular heart rhythm:        Vascular    Pain in calf, thigh, or hip brought on by ambulation:    Pain in feet at night that wakes you up from your sleep:     Blood clot in your veins:    Leg swelling:         Pulmonary    Oxygen at home:    Productive cough:     Wheezing:         Neurologic    Sudden weakness in arms or legs:     Sudden numbness in arms or legs:     Sudden onset of difficulty speaking or slurred speech:    Temporary loss of vision in one eye:     Problems with dizziness:         Gastrointestinal    Blood in stool:     Vomited blood:         Genitourinary    Burning when urinating:     Blood in urine:        Psychiatric    Major depression:         Hematologic    Bleeding problems:    Problems with blood clotting too easily:        Skin    Rashes or ulcers:        Constitutional    Fever or chills:     PHYSICAL EXAM:   Vitals:   10/05/20 1001  BP: 116/84  Pulse: (!) 105  Resp: 14  Temp: 97.9 F (36.6 C)  TempSrc: Temporal  SpO2: 97%  Weight: 183 lb (83 kg)  Height: 5\' 7"  (1.702 m)     GENERAL: The patient is a well-nourished female, in no acute distress. The vital signs are documented above. CARDIAC: There is a regular rate and rhythm.  VASCULAR: I do not detect carotid bruits. She has palpable posterior tibial pulses bilaterally. She has some reticular veins and spider veins along the lateral aspect of her right thigh and her right popliteal space.  I do not see any large truncal varicosities.      PULMONARY: There is good air exchange bilaterally without wheezing or rales. ABDOMEN: Soft and non-tender with normal pitched bowel sounds.  MUSCULOSKELETAL: There are no major deformities or cyanosis. NEUROLOGIC: No focal weakness or paresthesias are detected. SKIN: There are no ulcers or rashes noted. PSYCHIATRIC: The patient has a normal affect.  DATA:    VENOUS DUPLEX: I have independently interpreted her venous duplex scan of the right lower extremity.  There is no evidence of DVT or superficial venous thrombosis.  There is deep venous reflux involving the common femoral vein.  There is no significant superficial venous reflux.

## 2020-10-30 NOTE — Progress Notes (Signed)
Virtual Visit via Video Note The purpose of this virtual visit is to provide medical care while limiting exposure to the novel coronavirus.    Consent was obtained for video visit:  Yes.   Answered questions that patient had about telehealth interaction:  Yes.   I discussed the limitations, risks, security and privacy concerns of performing an evaluation and management service by telemedicine. I also discussed with the patient that there may be a patient responsible charge related to this service. The patient expressed understanding and agreed to proceed.  Pt location: Home Physician Location: office Name of referring provider:  Deatra James, MD I connected with [name] at patients initiation/request on 10/31/2020 at  3:30 PM EDT by video enabled telemedicine application and verified that I am speaking with the correct person using two identifiers. Pt MRN:  [id number] Pt DOB:  11-Mar-1986 Video Participants:  [name]  Assessment and Plan:   Migraine with aura/hemiplegic migraine History of Chiari malformation s/p decompression  1.  Migraine prevention:  Ajovy Q4wks 2.  Migraine rescue:  Will restart Ubrelvy 100mg .  I will have her take it when she develops the neck pain. 3.  Limit use of pain relievers to no more than 2 days out of week to prevent risk of rebound or medication-overuse headache. 4.  Keep headache diary 5.  Follow up 6 months  History of Present Illness:  [name] is a 35 year old female with ADD, PTSD, tobacco use disorder and history of Chiari malformation status post surgery 08/08/2016 who follows up for hemiplegic migraine.  UPDATE: Restarted Ajovy in October.  Lost job in January.  Headaches improved possibly due to not on the computer as much.  They seem to be preceded more by neck pain.  They are severe.  She has had 4-5 a month.  They last about a day (with Ubrelvy) or otherwise 2 days.  She tried Reyvow but didn't like it as much as  February.    Rescue protocol:  Tylenol, may take Ubrelvy Frequency of abortive medication: rarely takes Advil Current NSAIDS/analgesics:  Advil Current triptans:  none Current ergotamine:  none Current anti-emetic:  none Current muscle relaxants:  none Current Antihypertensive medications:  none Current Antidepressant medications:  none Current Anticonvulsant medications:  none Current anti-CGRP:  Ajovy Current Vitamins/Herbal/Supplements:  none Current Antihistamines/Decongestants:  none Other therapy:  Reyvow Other medication:  Adderall  Caffeine:At least 6 cups of coffee a day Diet:Ginger ale. Water. Tries to reduce salt intake. Exercise:Tries to walk. Depression:History of depression. Doing well now; Anxiety:Yes Other pain:no Sleep hygiene:Good.  HISTORY: Onset:She has had infrequent migraines since she was a teenager. They worsened after surgery for Chiari malformation in February 2018. Location:Top of head, sometimes associated neck pain. Sometimes preceded by jolts of pain in head. Quality:Pounding, throbbing, pressure Initial intensity:Severe.Shedenies new headache, thunderclap headache or severe headache that wakes herfrom sleep. Aura:Right sided facial sensory disturbance (not numb or tingling) and right sided upper and lower facial weakness. Premonitory Phase:none Postdrome:Feels washed out for a day Associated symptoms:Right facial weakness and altered sensory disturbance as well as clumsiness and altered sensory disturbance of her right hand.Photophobia, phonophobia. Rarely nausea. No vomiting. Initial Duration:2 days (first day the worst). May occur up to 7 days. Right facial droop lasts 24 hours or up to 48 hours for more severe) Initial Frequency:Since stopping Ajovy, mild to moderate occur 2 to 3 times a week; about 2 severe migraines a month. Initial Frequency of abortive  medication:2 to 3 days a week ibuprofen  at most. Triggers:Emotional stress, neck tension Exacerbating factors:Bending over Relieving factors:nothing Activity:Cannot function when severe.  02/13/2017 MRI BRAIN W WO (personally reviewed): Status post suboccipital decompressive craniectomy; otherwise unremarkable. 11/10/2016 CT HEAD WO (personally reviewed): Status post suboccipital decompressive craniectomy; otherwise, unremarkable.  Past NSAIDS:Diclofenac 50mg  EC; meloxicam Past analgesics:Tylenol #3; tramadol; Tylenol; Excedrin Migraine Past abortive triptans:none Past abortive ergotamine:none Past muscle relaxants:Methocarbamol; tizanidine, cyclobenzaprine Past anti-emetic:Promethazine 25mg ; procholorperzine; ondansetron 4mg  Past antihypertensive medications:propranolol Past antidepressant medications:Nortriptyline 10mg ; sertraline 50mg ; citalopram; fluoxetine; bupropion Past anticonvulsant medications:topiramate 100mg  daily; levetiracetam 500mg  twice daily; lamotrigine 25mg  Past anti-CGRP:Emgality (1 1/2 months;ineffective but did not tolerate injection); Ajovy (effective but insurance wouldn't cover it), Nurtec (ineffective for rescue), Aimovig 70mg , Ubrelvy 100mg  Past vitamins/Herbal/Supplements:none Past antihistamines/decongestants:none Other past therapies:none She states that Botox is not an option because her neurosurgeon advised her that she cannot receive injections in the area of her surgeryas she still has sensitivity to back of head.  Family history of headache:Mother (migraines); uncle (severe migraines)   Past Medical History: Past Medical History:  Diagnosis Date  . Acute appendicitis   . Anxiety   . 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 CERVICAL SPASM 09/08/2007   Qualifier: Diagnosis of  By: MD,    . Cervicalgia   . 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 Childhood asthma    seasonal with allergies  .  Complication of anesthesia    hard to wake up post-op  . Depression   . Endometriosis 09/16/2012  . Family history of adverse reaction to anesthesia    pt's father has hx. of being hard to wake up post-op  . Headache   . History of Chiari malformation   . History of MRSA infection    axilla  . 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 Panic attacks   . PTSD (post-traumatic stress disorder)   . Shoulder dislocation 06/2015   right    Medications: Outpatient Encounter Medications as of 10/31/2020  Medication Sig  . amphetamine-dextroamphetamine (ADDERALL) 20 MG tablet Take 20 mg by mouth daily.   . Esomeprazole Magnesium (NEXIUM) 5 MG PACK Take 5 mg by mouth daily.  . Fremanezumab-vfrm (AJOVY) 225 MG/1.5ML SOAJ Inject 225 mg into the skin every 28 (twenty-eight) days.  . benzonatate (TESSALON) 200 MG capsule Take 1 capsule (200 mg total) by mouth 3 (three) times daily as needed for cough. Swallow whole, do not chew (Patient not taking: No sig reported)  . buPROPion (WELLBUTRIN SR) 100 MG 12 hr tablet Wellbutrin SR 100 mg tablet, 12 hr sustained-release  Take 1 tablet every day by oral route. (Patient not taking: No sig reported)  . dicyclomine (BENTYL) 10 MG capsule Take 10 mg by mouth 4 (four) times daily -  before meals and at bedtime. (Patient not taking: No sig reported)  . famotidine (PEPCID) 20 MG tablet Take 1 tablet (20 mg total) by mouth 2 (two) times daily. (Patient not taking: No sig reported)  . ibuprofen (ADVIL) 800 MG tablet Take 1 tablet (800 mg total) by mouth 3 (three) times daily. (Patient not taking: No sig reported)  . metroNIDAZOLE (FLAGYL) 250 MG tablet Take 250 mg by mouth 3 (three) times daily. (Patient not taking: No sig reported)  . sucralfate (CARAFATE) 1 GM/10ML suspension Take 10 mLs (1 g total) by mouth 4 (four) times daily -  with  meals and at bedtime.  (Patient not taking: Reported on 10/31/2020)   No facility-administered encounter medications on file as of 10/31/2020.    Allergies: Allergies  Allergen Reactions  . Ciprofloxacin Shortness Of Breath  . Dilaudid [Hydromorphone Hcl] Shortness Of Breath  . Morphine   . Morphine Sulfate Other (See Comments)  . Other Other (See Comments)  . Adhesive [Tape] Rash  . Amoxicillin Rash  . Morphine And Related Itching and Nausea And Vomiting    Have to give benadryl with morphine, also nausea/vomiting    Family History: Family History  Problem Relation Age of Onset  . Anesthesia problems Father        hard to wake up post-op  . Stroke Father   . Varicose Veins Mother   . Varicose Veins Maternal Grandmother     Observations/Objective:   Height 5\' 7"  (1.702 m), weight 190 lb (86.2 kg), last menstrual period 08/18/2012. No acute distress.  Alert and oriented.  Speech fluent and not dysarthric.  Language intact.  \   Follow Up Instructions:    -I discussed the assessment and treatment plan with the patient. The patient was provided an opportunity to ask questions and all were answered. The patient agreed with the plan and demonstrated an understanding of the instructions.   The patient was advised to call back or seek an in-person evaluation if the symptoms worsen or if the condition fails to improve as anticipated.  08/20/2012, DO

## 2020-10-31 ENCOUNTER — Other Ambulatory Visit: Payer: Self-pay

## 2020-10-31 ENCOUNTER — Encounter: Payer: Self-pay | Admitting: Neurology

## 2020-10-31 ENCOUNTER — Telehealth (INDEPENDENT_AMBULATORY_CARE_PROVIDER_SITE_OTHER): Payer: Medicaid Other | Admitting: Neurology

## 2020-10-31 VITALS — Ht 67.0 in | Wt 190.0 lb

## 2020-10-31 DIAGNOSIS — Z8669 Personal history of other diseases of the nervous system and sense organs: Secondary | ICD-10-CM | POA: Diagnosis not present

## 2020-10-31 DIAGNOSIS — G43109 Migraine with aura, not intractable, without status migrainosus: Secondary | ICD-10-CM

## 2020-10-31 MED ORDER — AJOVY 225 MG/1.5ML ~~LOC~~ SOSY: 225.0000 mg | PREFILLED_SYRINGE | SUBCUTANEOUS | 5 refills | Status: DC

## 2020-10-31 MED ORDER — UBRELVY 100 MG PO TABS: 1.0000 | ORAL_TABLET | ORAL | 5 refills | Status: DC | PRN

## 2020-11-23 ENCOUNTER — Other Ambulatory Visit: Payer: Self-pay | Admitting: Student

## 2020-11-23 DIAGNOSIS — M542 Cervicalgia: Secondary | ICD-10-CM

## 2020-12-02 ENCOUNTER — Ambulatory Visit
Admission: RE | Admit: 2020-12-02 | Discharge: 2020-12-02 | Disposition: A | Discharge status: Home / Self Care | Payer: Medicaid Other | Source: Ambulatory Visit | Attending: Student | Admitting: Student

## 2020-12-02 ENCOUNTER — Other Ambulatory Visit: Payer: Self-pay

## 2020-12-02 DIAGNOSIS — M542 Cervicalgia: Secondary | ICD-10-CM

## 2020-12-20 ENCOUNTER — Telehealth: Payer: Self-pay | Admitting: Neurology

## 2020-12-20 MED ORDER — UBRELVY 100 MG PO TABS: 1.0000 | ORAL_TABLET | ORAL | 5 refills | Status: DC | PRN

## 2020-12-20 NOTE — Telephone Encounter (Signed)
Pt advised Bernita Raisin sent to [facility] will send a message to Billing to start a PA

## 2020-12-20 NOTE — Telephone Encounter (Signed)
[name] would like a call back regarding her medicine that was sent in to [facility]. She is having a hard time receiving them. She said its been a while and she is sorry its taken a while to call about them but she has been busy. [phone]

## 2020-12-22 NOTE — Telephone Encounter (Signed)
Left a message for patient on 6/22 that I am being told that there is a primary insurance that the auth needs to go through first and to please call us to clarify if the [facility] [employer] is the only insurance so we can move forward with the PA request for [employer].

## 2021-01-05 NOTE — Telephone Encounter (Signed)
Put in authorizations for medications to do

## 2021-03-16 ENCOUNTER — Telehealth: Payer: Self-pay

## 2021-03-16 NOTE — Telephone Encounter (Signed)
New message   [name] - PA Case ID: [id number] - Rx #: [id number] Need help? Call us at [phone] Status Sent to Plan today Drug AJOVY (fremanezumab-vfrm) injection 225MG /1.5ML syringes Form [employer] Electronic Prior Authorization Form (2017 NCPDP)

## 2021-03-19 ENCOUNTER — Telehealth: Payer: Self-pay

## 2021-03-19 NOTE — Telephone Encounter (Signed)
New message   ALPA SALVO (KeyGeorgina Pillion) Rx #: [id number] Bernita Raisin 100MG  tablets   Form [employer] Electronic Prior Authorization Form (2017 NCPDP) Created 4 days ago Sent to Plan 6 minutes ago Plan Response 6 minutes ago Submit Clinical Questions less than a minute ago Determination Wait for Determination Please wait for [employer] 2017 NCPDP to return a determination.

## 2021-04-16 NOTE — Telephone Encounter (Signed)
F/U   Outcome Approvedon September 16 Request Reference Number: [id number]. AJOVY INJ 225/1.5 is approved through 03/16/2022. For further questions, call [employer] at [phone]. Drug AJOVY (fremanezumab-vfrm) injection 225MG /1.5ML syringes Form [employer] Electronic Prior Authorization Form [phone] NCPDP)

## 2021-05-04 NOTE — Progress Notes (Deleted)
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 Keep headache diary Follow up ***   Subjective:  [name] is a 35 year old female with ADD, PTSD, tobacco use disorder and history of Chiari malformation status post surgery 08/08/2016 who follows up for hemiplegic migraine.   UPDATE: Restarted Ajovy in October.  Lost job in January.  Headaches improved possibly due to not on the computer as much.  They seem to be preceded more by neck pain.  They are severe.  She has had 4-5 a month.  They last about a day (with Ubrelvy) or otherwise 2 days.  She tried Reyvow but didn't like it as much as February.     Rescue protocol:  Tylenol, may take Ubrelvy Frequency of abortive medication: rarely takes Advil Current NSAIDS/analgesics:  Advil Current triptans:  none Current ergotamine:  none Current anti-emetic:  none Current muscle relaxants:  none Current Antihypertensive medications:  none Current Antidepressant medications:  none Current Anticonvulsant medications:  none Current anti-CGRP:  Ajovy Current Vitamins/Herbal/Supplements:  none Current Antihistamines/Decongestants:  none Other therapy:  Reyvow Other medication:  Adderall   Caffeine:  At least 6 cups of coffee a day Diet:  Ginger ale.  Water.  Tries to reduce salt intake. Exercise:  Tries to walk. Depression:  History of depression.  Doing well now; Anxiety:  Yes Other pain:  no Sleep hygiene:  Good.   HISTORY:  Onset:  She has had infrequent migraines since she was a teenager.  They worsened after surgery for Chiari malformation in February 2018. Location:  Top of head,  sometimes associated neck pain.  Sometimes preceded by jolts of pain in head. Quality:  Pounding, throbbing, pressure Initial intensity:  Severe.  She denies new headache, thunderclap headache or severe headache that wakes her from sleep. Aura:  Right sided facial sensory disturbance (not numb or tingling) and right sided upper and lower facial weakness. Premonitory Phase:  none Postdrome:  Feels washed out for a day Associated symptoms:  Right facial weakness and altered sensory disturbance as well as clumsiness and altered sensory disturbance of her right hand. Photophobia, phonophobia.  Rarely nausea.  Often preceded by neck pain.  No vomiting. Initial Duration:  2 days (first day the worst).  May occur up to 7 days.  Right facial droop lasts 24 hours or up to 48 hours for more severe) Initial Frequency:  Since stopping Ajovy, mild to moderate occur 2 to 3 times a week; about 2 severe migraines a month. Initial Frequency of abortive medication: 2 to 3 days a week ibuprofen at most. Triggers:  Emotional stress, neck tension Exacerbating factors:  Bending over Relieving factors:  nothing Activity:  Cannot function when severe.   02/13/2017 MRI BRAIN W WO (personally reviewed):  Status post suboccipital decompressive craniectomy; otherwise unremarkable. 11/10/2016 CT HEAD WO (personally reviewed):  Status post suboccipital decompressive craniectomy; otherwise, unremarkable.   Past NSAIDS:  Diclofenac 50mg  EC;  meloxicam Past analgesics:  Tylenol #3; tramadol; Tylenol; Excedrin Migraine Past abortive triptans:  none Past abortive ergotamine:  none Past muscle relaxants:  Methocarbamol; tizanidine, cyclobenzaprine Past anti-emetic:  Promethazine 25mg ; procholorperzine; ondansetron 4mg  Past antihypertensive medications:  propranolol Past antidepressant medications:  Nortriptyline 10mg ; sertraline 50mg ; citalopram; fluoxetine; bupropion Past anticonvulsant medications:  topiramate 100mg  daily;  levetiracetam 500mg  twice daily; lamotrigine 25mg  Past anti-CGRP:  Emgality (1 1/2 months; ineffective but did not tolerate injection); Ajovy (effective but insurance wouldn't cover it), Nurtec (ineffective for rescue), Aimovig 70mg , Ubrelvy 100mg  Past vitamins/Herbal/Supplements:  none Past antihistamines/decongestants:  none Other past therapies:  none She states that Botox is not an option because her neurosurgeon advised her that she cannot receive injections in the area of her surgery as she still has sensitivity to back of head.     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   started antibiotic 06/20/2015   Panic attacks    PTSD (post-traumatic stress disorder)    Shoulder dislocation 06/2015   right    MEDICATIONS: Current Outpatient Medications on File Prior to Visit  Medication Sig Dispense Refill   amphetamine-dextroamphetamine (ADDERALL) 20 MG tablet Take 20 mg by mouth daily.      benzonatate (TESSALON) 200 MG capsule Take 1 capsule (200 mg total) by mouth 3 (three) times daily as needed for cough. Swallow whole, do not chew (Patient not taking: No sig reported) 21 capsule 0   buPROPion (WELLBUTRIN SR)  100 MG 12 hr tablet Wellbutrin SR 100 mg tablet, 12 hr sustained-release  Take 1 tablet every day by oral route. (Patient not taking: No sig reported)     dicyclomine (BENTYL) 10 MG capsule Take 10 mg by mouth 4 (four) times daily -  before meals and at bedtime. (Patient not taking: No sig reported)     Esomeprazole Magnesium (NEXIUM) 5 MG PACK Take 5 mg by mouth daily. 120 each 2   famotidine (PEPCID) 20 MG tablet Take 1 tablet (20 mg total) by mouth 2 (two) times daily. (Patient not taking: No sig reported) 30 tablet 0   Fremanezumab-vfrm (AJOVY) 225 MG/1.5ML SOSY Inject 225 mg into the skin every 28 (twenty-eight) days. 1.68 mL 5   ibuprofen (ADVIL) 800 MG tablet Take 1 tablet (800 mg total) by mouth 3 (three) times daily. (Patient not taking: No sig reported) 21 tablet 0   metroNIDAZOLE (FLAGYL) 250 MG tablet Take 250 mg by mouth 3 (three) times daily. (Patient not taking: No sig reported)     sucralfate (CARAFATE) 1 GM/10ML suspension Take 10 mLs (1 g total) by mouth 4 (four) times daily -  with meals and at bedtime. (Patient not taking: Reported on 10/31/2020) 420 mL 0   Ubrogepant (UBRELVY) 100 MG TABS Take 1 tablet by mouth as needed (May repeat in 2 hours. Maximum 2 tablets in 24  hours). 16 tablet 5   No current facility-administered medications on file prior to visit.    ALLERGIES: Allergies  Allergen Reactions   Ciprofloxacin Shortness Of Breath   Dilaudid [Hydromorphone Hcl] Shortness Of Breath   Morphine    Morphine Sulfate Other (See Comments)   Other Other (See Comments)   Adhesive [Tape] Rash   Amoxicillin Rash   Morphine And Related Itching and Nausea And Vomiting    Have to give benadryl with morphine, also nausea/vomiting    FAMILY HISTORY: Family History  Problem Relation Age of Onset   Anesthesia problems Father        hard to wake up post-op   Stroke Father    Varicose Veins Mother    Varicose Veins Maternal Grandmother       Objective:  *** General: No  acute distress.  Patient appears ***-groomed.   Head:  Normocephalic/atraumatic Eyes:  Fundi examined but not visualized Neck: supple, no paraspinal tenderness, full range of motion Heart:  Regular rate and rhythm Lungs:  Clear to auscultation bilaterally Back: No paraspinal tenderness Neurological Exam: alert and oriented to person, place, and time.  Speech fluent and not dysarthric, language intact.  CN II-XII intact. Bulk and tone normal, muscle strength 5/5 throughout.  Sensation to light touch intact.  Deep tendon reflexes 2+ throughout, toes downgoing.  Finger to nose testing intact.  Gait normal, Romberg negative.   Shon Millet, DO  CC: ***

## 2021-05-07 ENCOUNTER — Ambulatory Visit: Payer: Medicaid Other | Admitting: Neurology

## 2021-06-26 ENCOUNTER — Emergency Department (HOSPITAL_COMMUNITY): Payer: Medicaid Other

## 2021-06-26 ENCOUNTER — Other Ambulatory Visit: Payer: Self-pay

## 2021-06-26 ENCOUNTER — Emergency Department (HOSPITAL_COMMUNITY)
Admission: EM | Admit: 2021-06-26 | Discharge: 2021-06-26 | Disposition: A | Discharge status: Home / Self Care | Payer: Medicaid Other | Attending: Emergency Medicine | Admitting: Emergency Medicine

## 2021-06-26 DIAGNOSIS — R0602 Shortness of breath: Secondary | ICD-10-CM | POA: Insufficient documentation

## 2021-06-26 DIAGNOSIS — R0789 Other chest pain: Secondary | ICD-10-CM | POA: Insufficient documentation

## 2021-06-26 DIAGNOSIS — F17210 Nicotine dependence, cigarettes, uncomplicated: Secondary | ICD-10-CM | POA: Insufficient documentation

## 2021-06-26 DIAGNOSIS — R079 Chest pain, unspecified: Secondary | ICD-10-CM

## 2021-06-26 DIAGNOSIS — J45909 Unspecified asthma, uncomplicated: Secondary | ICD-10-CM | POA: Diagnosis not present

## 2021-06-26 DIAGNOSIS — Z8616 Personal history of COVID-19: Secondary | ICD-10-CM | POA: Insufficient documentation

## 2021-06-26 LAB — BASIC METABOLIC PANEL
Anion gap: 9 (ref 5–15)
BUN: 8 mg/dL (ref 6–20)
CO2: 24 mmol/L (ref 22–32)
Calcium: 8.8 mg/dL — ABNORMAL LOW (ref 8.9–10.3)
Chloride: 100 mmol/L (ref 98–111)
Creatinine, Ser: 0.63 mg/dL (ref 0.44–1.00)
GFR, Estimated: >60 mL/min (ref >60)
Glucose, Bld: 91 mg/dL (ref 70–99)
Potassium: 3.3 mmol/L — ABNORMAL LOW (ref 3.5–5.1)
Sodium: 133 mmol/L — ABNORMAL LOW (ref 135–145)

## 2021-06-26 LAB — CBC
HCT: 45 % (ref 36.0–46.0)
Hemoglobin: 14.8 g/dL (ref 12.0–15.0)
MCH: 33 pg (ref 26.0–34.0)
MCHC: 32.9 g/dL (ref 30.0–36.0)
MCV: 100.2 fL — ABNORMAL HIGH (ref 80.0–100.0)
Platelets: 246 10*3/uL (ref 150–400)
RBC: 4.49 MIL/uL (ref 3.87–5.11)
RDW: 11.9 % (ref 11.5–15.5)
WBC: 6.3 10*3/uL (ref 4.0–10.5)
nRBC: 0 % (ref 0.0–0.2)

## 2021-06-26 LAB — TROPONIN I (HIGH SENSITIVITY)
Troponin I (High Sensitivity): <2 ng/L (ref <18)
Troponin I (High Sensitivity): <2 ng/L (ref <18)

## 2021-06-26 LAB — D-DIMER, QUANTITATIVE: D-Dimer, Quant: <0.27 ug/mL-FEU (ref 0.00–0.50)

## 2021-06-26 MED ORDER — SUCRALFATE 1 GM/10ML PO SUSP: 1.0000 g | Freq: Three times a day (TID) | ORAL | 0 refills | Status: DC

## 2021-06-26 NOTE — ED Notes (Signed)
Pulse oximetry with ambulation. O2 SATS 94%-96% RA. HR 110. Pt reports breathing feeling "labored" , but not "short of breath" with ambulation, but no obvious s/s of distress noted with ambulation.

## 2021-06-26 NOTE — ED Provider Notes (Signed)
[facility] EMERGENCY DEPARTMENT Provider Note   CSN: [id number] Arrival date & time: 06/26/21  1812     History Chief Complaint  Patient presents with   Shortness of Breath    [name] is a 35 y.o. female.   Shortness of Breath Associated symptoms: chest pain   Associated symptoms: no abdominal pain and no rash   Patient presents with chest pain shortness of breath.  Has had over the last couple weeks.  Had been diagnosed with COVID a couple weeks ago.  States initially went to urgent care and they would not do a COVID test but did a flu test was negative.  States these gave her antivirals anyway.  States a day or 2 later had a positive COVID test at home.  Had been given Paxil bid by PCP.  States that began to have more pain in her chest.  Did have shortness of breath.  States that she felt as if the pills were also getting irritated in her chest.  Had took some of the Carafate that she had without relief.  States this felt like previous esophageal issues she had.  Does still feel some shortness of breath.  Chest pain.  Worse with certain positions.  Also worse with pressing on the chest.  No swelling in her legs.    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   started antibiotic 06/20/2015   Panic  attacks    PTSD (post-traumatic stress disorder)    Shoulder dislocation 06/2015   right    Patient Active Problem List   Diagnosis Date Noted   Intractable chronic migraine without aura and with status migrainosus 03/24/2017   Acute appendicitis    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   Endometriosis 09/16/2012   CERVICALGIA 09/08/2007   CERVICAL SPASM 09/08/2007    Past Surgical History:  Procedure Laterality Date   BRAIN SURGERY     CESAREAN SECTION  09/05/2008   CYSTOSCOPY N/A 09/15/2012   Procedure: CYSTOSCOPY;  Surgeon: Roselle Locus II, MD;  Location: [facility];  Service: Gynecology;  Laterality: N/A;   DILATION AND EVACUATION  04/15/2007   HYSTEROSCOPY WITH D & C  03/31/2012   Procedure: DILATATION AND CURETTAGE /HYSTEROSCOPY;  Surgeon: Leslie Andrea, MD;  Location: [facility];  Service: Gynecology;  Laterality: N/A;   LAPAROSCOPIC APPENDECTOMY N/A 10/29/2016   Procedure: APPENDECTOMY LAPAROSCOPIC;  Surgeon: Franky Macho, MD;  Location: [facility];  Service: General;  Laterality: N/A;   LAPAROSCOPIC ASSISTED VAGINAL HYSTERECTOMY N/A 09/15/2012   Procedure: LAPAROSCOPIC ASSISTED VAGINAL HYSTERECTOMY;  Surgeon: Leslie Andrea, MD;  Location: [facility];  Service: Gynecology;  Laterality: N/A;   LAPAROSCOPY  03/31/2012  Procedure: LAPAROSCOPY OPERATIVE;  Surgeon: Leslie Andrea, MD;  Location: [facility];  Service: Gynecology;  Laterality: N/A;  with Biopsies of Bilateral Fallopian Tubes; Fulguration of Endometriosis   SHOULDER ARTHROSCOPY WITH CAPSULORRHAPHY Right 06/28/2015   Procedure: RIGHT SHOULDER ARTHROSCOPY WITH CAPSULORRHAPHY;  Surgeon: Frederico Hamman, MD;  Location: [facility];  Service: Orthopedics;  Laterality: Right;   SUBOCCIPITAL CRANIECTOMY CERVICAL LAMINECTOMY N/A 08/08/2016   Procedure: SUBOCCIPITAL CRANIECTOMY CERVICAL LAMINECTOMY NUMBER DURAPLASTY;  Surgeon: Tressie Stalker, MD;  Location: [facility];  Service: Neurosurgery;  Laterality: N/A;  suboccipital    ULNAR NERVE REPAIR Right 05/2015     OB History     Gravida  2   Para  1   Term  1   Preterm      AB  1   Living  1      SAB  1   IAB      Ectopic      Multiple      Live Births              Family History  Problem Relation Age of Onset   Anesthesia problems Father        hard to wake up post-op   Stroke Father    Varicose Veins Mother    Varicose Veins Maternal Grandmother     Social History   Tobacco Use   Smoking status: Every Day    Packs/day: 1.00    Years: 10.00    Pack years: 10.00    Types: Cigarettes   Smokeless tobacco: Never   Tobacco comments:    quit 2 months ago  Vaping Use   Vaping Use: Never used  Substance Use Topics   Alcohol use: Yes    Comment: occasionally    Drug use: No    Home Medications Prior to Admission medications   Medication Sig Start Date End Date Taking? Authorizing Provider  amphetamine-dextroamphetamine (ADDERALL) 20 MG tablet Take 20 mg by mouth daily.     [provider]  benzonatate (TESSALON) 200 MG capsule Take 1 capsule (200 mg total) by mouth 3 (three) times daily as needed for cough. Swallow whole, do not chew Patient not taking: No sig reported 08/19/20   Triplett, Tammy, PA-C  buPROPion (WELLBUTRIN SR) 100 MG 12 hr tablet Wellbutrin SR 100 mg tablet, 12 hr sustained-release  Take 1 tablet every day by oral route. Patient not taking: No sig reported    [provider]  dicyclomine (BENTYL) 10 MG capsule Take 10 mg by mouth 4 (four) times daily -  before meals and at bedtime. Patient not taking: No sig reported    [provider]  Esomeprazole Magnesium (NEXIUM) 5 MG PACK Take 5 mg by mouth daily. 12/22/18   Gilda Crease, MD  famotidine (PEPCID) 20 MG tablet Take 1 tablet (20 mg total) by mouth 2 (two) times daily. Patient not taking: No sig reported 12/16/18   Vanetta Mulders, MD  Fremanezumab-vfrm (AJOVY) 225 MG/1.5ML SOSY Inject 225 mg into the skin every 28  (twenty-eight) days. 10/31/20   Drema Dallas, DO  ibuprofen (ADVIL) 800 MG tablet Take 1 tablet (800 mg total) by mouth 3 (three) times daily. Patient not taking: No sig reported 08/19/20   Triplett, Tammy, PA-C  metroNIDAZOLE (FLAGYL) 250 MG tablet Take 250 mg by mouth 3 (three) times daily. Patient not taking: No sig reported    [provider]  sucralfate (CARAFATE) 1 GM/10ML suspension Take 10  mLs (1 g total) by mouth 4 (four) times daily -  with meals and at bedtime. 06/26/21   Benjiman Core, MD  Ubrogepant (UBRELVY) 100 MG TABS Take 1 tablet by mouth as needed (May repeat in 2 hours. Maximum 2 tablets in 24 hours). 12/20/20   Drema Dallas, DO    Allergies    Ciprofloxacin, Dilaudid [hydromorphone hcl], Morphine, Morphine sulfate, Other, Adhesive [tape], Amoxicillin, and Morphine and related  Review of Systems   Review of Systems  Constitutional:  Positive for appetite change.  Respiratory:  Positive for shortness of breath.   Cardiovascular:  Positive for chest pain. Negative for leg swelling.  Gastrointestinal:  Negative for abdominal pain.  Genitourinary:  Negative for flank pain.  Musculoskeletal:  Negative for back pain.  Skin:  Negative for rash.  Neurological:  Negative for weakness.   Physical Exam Updated Vital Signs BP 117/77 (BP Location: Right Arm)    Pulse 79    Temp 98.9 F (37.2 C) (Oral)    Resp 16    LMP 08/18/2012    SpO2 100%   Physical Exam Vitals and nursing note reviewed.  HENT:     Head: Atraumatic.  Cardiovascular:     Rate and Rhythm: Normal rate and regular rhythm.  Pulmonary:     Breath sounds: Normal breath sounds. No wheezing or rhonchi.  Chest:     Chest wall: Tenderness present.     Comments: Tenderness to anterior chest wall.  Particularly parasternal and right side chest.  No pain with lateral pressure on the chest.  No edema.  No rash. Abdominal:     Tenderness: There is no abdominal tenderness.  Musculoskeletal:     Cervical  back: Neck supple.     Right lower leg: No edema.     Left lower leg: No edema.  Skin:    Capillary Refill: Capillary refill takes less than 2 seconds.  Neurological:     Mental Status: She is alert and oriented to person, place, and time.    ED Results / Procedures / Treatments   Labs (all labs ordered are listed, but only abnormal results are displayed) Labs Reviewed  BASIC METABOLIC PANEL - Abnormal; Notable for the following components:      Result Value   Sodium 133 (*)    Potassium 3.3 (*)    Calcium 8.8 (*)    All other components within normal limits  CBC - Abnormal; Notable for the following components:   MCV 100.2 (*)    All other components within normal limits  D-DIMER, QUANTITATIVE  POC URINE PREG, ED  TROPONIN I (HIGH SENSITIVITY)  TROPONIN I (HIGH SENSITIVITY)    EKG EKG Interpretation  Date/Time:  Tuesday June 26 2021 18:22:03 EST Ventricular Rate:  85 PR Interval:  158 QRS Duration: 84 QT Interval:  350 QTC Calculation: 416 R Axis:   72 Text Interpretation: Normal sinus rhythm Normal ECG Confirmed by Benjiman Core [phone]) on 06/26/2021 9:14:09 PM  Radiology DG Chest 2 View  Result Date: 06/26/2021 CLINICAL DATA:  Chest pain. EXAM: CHEST - 2 VIEW COMPARISON:  August 19, 2020. FINDINGS: The heart size and mediastinal contours are within normal limits. Both lungs are clear. The visualized skeletal structures are unremarkable. IMPRESSION: No active cardiopulmonary disease. Electronically Signed   By: Lupita Raider [name]   On: 06/26/2021 19:02    Procedures Procedures   Medications Ordered in ED Medications - No data to display  ED Course  I have  reviewed the triage vital signs and the nursing notes.  Pertinent labs & imaging results that were available during my care of the patient were reviewed by me and considered in my medical decision making (see chart for details).    MDM Rules/Calculators/A&P                         Patient  with shortness of breath.  Chest pain.  Is had since COVID.  Does have some mild tachypnea.  Ambulated without hypoxia but felt a little labored breathing.  With mild tachycardia D-dimer was done and was negative.  Chest x-ray reassuring.  No pneumonia.  With the feeling of pills in her throat potentially could be some esophageal issues.  Patient states she is had this in the past.  Can follow-up as an outpatient.  Doubt myocarditis.  Negative troponin and reassuring EKG.  Will refill patient's Carafate and follow-up with PCP as needed.  Will discharge home    Final Clinical Impression(s) / ED Diagnoses Final diagnoses:  Nonspecific chest pain    Rx / DC Orders ED Discharge Orders          Ordered    sucralfate (CARAFATE) 1 GM/10ML suspension  3 times daily with meals & bedtime        06/26/21 2321             Benjiman Core, MD 06/27/21 0008

## 2021-06-26 NOTE — Discharge Instructions (Signed)
Your work-up was reassuring.  This does not appear to be your heart and is not a blood clot.  No pneumonia showed up on x-ray.  Could be chest wall pain and could be esophageal or some other cause.  Could be residual from the COVID.  We have refilled your Carafate.  Follow-up with your doctors as needed.

## 2021-06-26 NOTE — ED Triage Notes (Signed)
Pt arrives with c/o SOB and chest pain. Pt tested COVID about 2 weeks. Per pt, she was treated for COVID with outpt meds. Pt was also treated for PNA with doxycycline. Per pt, she feel like she isn't getting any better. She was sent by MD for chest x-ray.

## 2021-06-26 NOTE — ED Notes (Signed)
Asked pt if they could give a urine sample and she declined. She stated she has had a hysterectomy and said that she felt it was unnecessary

## 2021-07-20 DIAGNOSIS — F411 Generalized anxiety disorder: Secondary | ICD-10-CM | POA: Diagnosis not present

## 2021-07-20 DIAGNOSIS — F4310 Post-traumatic stress disorder, unspecified: Secondary | ICD-10-CM | POA: Diagnosis not present

## 2021-07-20 DIAGNOSIS — Z79899 Other long term (current) drug therapy: Secondary | ICD-10-CM | POA: Diagnosis not present

## 2021-07-20 DIAGNOSIS — F988 Other specified behavioral and emotional disorders with onset usually occurring in childhood and adolescence: Secondary | ICD-10-CM | POA: Diagnosis not present

## 2021-08-13 DIAGNOSIS — R591 Generalized enlarged lymph nodes: Secondary | ICD-10-CM | POA: Diagnosis not present

## 2021-08-13 DIAGNOSIS — L739 Follicular disorder, unspecified: Secondary | ICD-10-CM | POA: Diagnosis not present

## 2021-08-13 DIAGNOSIS — R5383 Other fatigue: Secondary | ICD-10-CM | POA: Diagnosis not present

## 2021-08-13 DIAGNOSIS — R0989 Other specified symptoms and signs involving the circulatory and respiratory systems: Secondary | ICD-10-CM | POA: Diagnosis not present

## 2021-08-15 DIAGNOSIS — H9202 Otalgia, left ear: Secondary | ICD-10-CM | POA: Diagnosis not present

## 2021-08-15 DIAGNOSIS — R590 Localized enlarged lymph nodes: Secondary | ICD-10-CM | POA: Diagnosis not present

## 2021-08-15 DIAGNOSIS — R5383 Other fatigue: Secondary | ICD-10-CM | POA: Diagnosis not present

## 2021-08-19 ENCOUNTER — Other Ambulatory Visit: Payer: Self-pay

## 2021-08-19 ENCOUNTER — Emergency Department (HOSPITAL_COMMUNITY)
Admission: EM | Admit: 2021-08-19 | Discharge: 2021-08-20 | Disposition: A | Discharge status: Home / Self Care | Payer: BC Managed Care – PPO | Attending: Emergency Medicine | Admitting: Emergency Medicine

## 2021-08-19 DIAGNOSIS — J039 Acute tonsillitis, unspecified: Secondary | ICD-10-CM | POA: Diagnosis not present

## 2021-08-19 DIAGNOSIS — R079 Chest pain, unspecified: Secondary | ICD-10-CM | POA: Diagnosis not present

## 2021-08-19 DIAGNOSIS — E871 Hypo-osmolality and hyponatremia: Secondary | ICD-10-CM | POA: Insufficient documentation

## 2021-08-19 DIAGNOSIS — R000 Tachycardia, unspecified: Secondary | ICD-10-CM | POA: Diagnosis not present

## 2021-08-19 DIAGNOSIS — R1310 Dysphagia, unspecified: Secondary | ICD-10-CM | POA: Diagnosis not present

## 2021-08-19 DIAGNOSIS — R9431 Abnormal electrocardiogram [ECG] [EKG]: Secondary | ICD-10-CM | POA: Diagnosis not present

## 2021-08-19 MED ORDER — SODIUM CHLORIDE 0.9 % IV BOLUS
1000.0000 mL | Freq: Once | INTRAVENOUS | Status: AC
  Administered 2021-08-19: 1000 mL via INTRAVENOUS

## 2021-08-19 NOTE — ED Provider Notes (Signed)
[facility] EMERGENCY DEPARTMENT Provider Note   CSN: [id number] Arrival date & time: 08/19/21  2044     History  Chief Complaint  Patient presents with   Dysphagia    SHATON RANDLEMAN is a 36 y.o. female.  The history is provided by the patient and medical records.  LILLIAM OZOG is a 36 y.o. female who presents to the Emergency Department complaining of throat discomfort.  She states that on Monday she developed a left-sided earache and was seen by her PCP and started on doxycycline.  At that time she was tested for strep, flu, COVID, mono and all of them were negative.  She states that the pain radiates down the left side of her neck and she feels like her glands are swollen.  She also did have some axillary swelling on that side but this is now improving.  She started developing upper abdominal pain and cramping taking the antibiotic.  She followed up with her doctor, who stated that she can stop her doxycycline as there was no sign of infection.  She states that tonight she felt like her throat felt more uncomfortable on the left side when attempting to swallow and felt like there was some swelling present.   No fever, vomiting, nausea. No cough. No trauma/injury Has occ runny nose. Has fatigue.  Night sweats - two weeks.    Hat a hx/o decompression surgery from chiari. Reflux.     Home Medications Prior to Admission medications   Medication Sig Start Date End Date Taking? Authorizing Provider  amphetamine-dextroamphetamine (ADDERALL) 20 MG tablet Take 20 mg by mouth daily.     [provider]  benzonatate (TESSALON) 200 MG capsule Take 1 capsule (200 mg total) by mouth 3 (three) times daily as needed for cough. Swallow whole, do not chew Patient not taking: No sig reported 08/19/20   Triplett, Tammy, PA-C  buPROPion (WELLBUTRIN SR) 100 MG 12 hr tablet Wellbutrin SR 100 mg tablet, 12 hr sustained-release  Take 1 tablet every day by oral  route. Patient not taking: No sig reported    [provider]  dicyclomine (BENTYL) 10 MG capsule Take 10 mg by mouth 4 (four) times daily -  before meals and at bedtime. Patient not taking: No sig reported    [provider]  Esomeprazole Magnesium (NEXIUM) 5 MG PACK Take 5 mg by mouth daily. 12/22/18   Gilda Crease, MD  famotidine (PEPCID) 20 MG tablet Take 1 tablet (20 mg total) by mouth 2 (two) times daily. Patient not taking: No sig reported 12/16/18   Vanetta Mulders, MD  Fremanezumab-vfrm (AJOVY) 225 MG/1.5ML SOSY Inject 225 mg into the skin every 28 (twenty-eight) days. 10/31/20   Drema Dallas, DO  ibuprofen (ADVIL) 800 MG tablet Take 1 tablet (800 mg total) by mouth 3 (three) times daily. Patient not taking: No sig reported 08/19/20   Triplett, Tammy, PA-C  metroNIDAZOLE (FLAGYL) 250 MG tablet Take 250 mg by mouth 3 (three) times daily. Patient not taking: No sig reported    [provider]  sucralfate (CARAFATE) 1 GM/10ML suspension Take 10 mLs (1 g total) by mouth 4 (four) times daily -  with meals and at bedtime. 06/26/21   Benjiman Core, MD  Ubrogepant (UBRELVY) 100 MG TABS Take 1 tablet by mouth as needed (May repeat in 2 hours. Maximum 2 tablets in 24 hours). 12/20/20   Drema Dallas, DO      Allergies  Ciprofloxacin, Dilaudid [hydromorphone hcl], Morphine, Morphine sulfate, Other, Adhesive [tape], Amoxicillin, and Morphine and related    Review of Systems   Review of Systems  All other systems reviewed and are negative.  Physical Exam Updated Vital Signs BP 119/75    Pulse 77    Temp 98.4 F (36.9 C) (Oral)    Resp 19    Ht 5\' 7"  (1.702 m)    Wt 90.7 kg    LMP 08/18/2012    SpO2 98%    BMI 31.32 kg/m  Physical Exam Vitals and nursing note reviewed.  Constitutional:      Appearance: She is well-developed.  HENT:     Head: Normocephalic and atraumatic.     Right Ear: Tympanic membrane normal.     Left Ear: Tympanic membrane  normal.     Nose: Nose normal.     Mouth/Throat:     Mouth: Mucous membranes are moist.     Pharynx: No oropharyngeal exudate or posterior oropharyngeal erythema.  Neck:     Comments: Mild TTP over left lateral neck Cardiovascular:     Rate and Rhythm: Regular rhythm. Tachycardia present.     Heart sounds: No murmur heard. Pulmonary:     Effort: Pulmonary effort is normal. No respiratory distress.     Breath sounds: Normal breath sounds.  Abdominal:     Palpations: Abdomen is soft.     Tenderness: There is abdominal tenderness. There is no guarding or rebound.     Comments: Mild epigastric tenderness  Musculoskeletal:        General: No tenderness.     Cervical back: Neck supple.  Lymphadenopathy:     Cervical: No cervical adenopathy.  Skin:    General: Skin is warm and dry.  Neurological:     Mental Status: She is alert and oriented to person, place, and time.  Psychiatric:        Behavior: Behavior normal.    ED Results / Procedures / Treatments   Labs (all labs ordered are listed, but only abnormal results are displayed) Labs Reviewed  COMPREHENSIVE METABOLIC PANEL - Abnormal; Notable for the following components:      Result Value   Sodium 134 (*)    Calcium 8.7 (*)    Total Protein 6.3 (*)    All other components within normal limits  CBC WITH DIFFERENTIAL/PLATELET  LIPASE, BLOOD  I-STAT BETA HCG BLOOD, ED ([facility], [facility], [facility] ONLY)    EKG EKG Interpretation  Date/Time:  Monday August 20 2021 00:20:17 EST Ventricular Rate:  81 PR Interval:  173 QRS Duration: 86 QT Interval:  378 QTC Calculation: 439 R Axis:   80 Text Interpretation: Sinus rhythm Consider left ventricular hypertrophy Nonspecific T abnrm, anterolateral leads Confirmed by Quintella Reichert [phone]) on 08/20/2021 1:40:53 AM  Radiology DG Chest 2 View  Result Date: 08/20/2021 CLINICAL DATA:  Chest pain and dysphagia. EXAM: CHEST - 2 VIEW COMPARISON:  06/26/2021. FINDINGS: The heart size and  mediastinal contours are within normal limits. Both lungs are clear. The visualized skeletal structures are unremarkable. IMPRESSION: No active cardiopulmonary disease. Electronically Signed   By: Brett Fairy [name]   On: 08/20/2021 00:12   CT Soft Tissue Neck W Contrast  Result Date: 08/20/2021 CLINICAL DATA:  Initial evaluation for acute epiglottitis or tonsillitis. EXAM: CT NECK WITH CONTRAST TECHNIQUE: Multidetector CT imaging of the neck was performed using the standard protocol following the bolus administration of intravenous contrast. RADIATION DOSE REDUCTION: This exam was performed according  to the departmental dose-optimization program which includes automated exposure control, adjustment of the mA and/or kV according to patient size and/or use of iterative reconstruction technique. CONTRAST:  3mL OMNIPAQUE IOHEXOL 300 MG/ML  SOLN COMPARISON:  None available. FINDINGS: Pharynx and larynx: Oral cavity within normal limits. No acute inflammatory changes seen about the dentition. Palatine tonsils are mildly prominent bilaterally, which could reflect mild changes of acute tonsillitis. Right tonsil is slightly more prominent as compared to the left. No discrete tonsillar or peritonsillar abscess. Remainder of the oropharynx and nasopharynx within normal limits. Epiglottis normal. No retropharyngeal collection or swelling. Hypopharynx and supraglottic larynx normal. Glottis normal. Subglottic airway clear. Salivary glands: Salivary glands including the parotid and submandibular glands are normal. Thyroid: Normal. Lymph nodes: No enlarged or pathologic adenopathy within the neck. Vascular: Normal intravascular enhancement seen throughout the neck. Limited intracranial: Probable changes of prior suboccipital craniectomy, partially visualized. Otherwise unremarkable. Visualized orbits: Unremarkable. Mastoids and visualized paranasal sinuses: Visualized paranasal sinuses and mastoid air cells are clear.  Skeleton: No discrete or worrisome osseous lesions. Upper chest: Unremarkable. Other: None. IMPRESSION: 1. Mild prominence of the palatine tonsils bilaterally, which could reflect mild changes of acute tonsillitis. Correlation with physical exam recommended. No discrete tonsillar or peritonsillar abscess. 2. No other acute abnormality within the neck. Electronically Signed   By: Jeannine Boga [name]   On: 08/20/2021 01:34    Procedures Procedures    Medications Ordered in ED Medications  sodium chloride 0.9 % bolus 1,000 mL (1,000 mLs Intravenous New Bag/Given 08/19/21 2358)  iohexol (OMNIPAQUE) 300 MG/ML solution 75 mL (75 mLs Intravenous Contrast Given 08/20/21 0108)    ED Course/ Medical Decision Making/ A&P                           Medical Decision Making Amount and/or Complexity of Data Reviewed Labs: ordered. Radiology: ordered.  Risk Prescription drug management.  Pt here for evaluation of one week of progressive left sided neck pain and sensation of neck swelling.  She is nontoxic appearing on evaluation with no respiratory distress.  Recent PCP records available, negative strep, mono, viral panel at that time.  Given her progressive symptoms labs and a CT soft tissue neck were obtained to rule out RPA, epiglottitis.  Labs are stable with mild hyponatremia, similar when compared to priors.  Neck is negative for acute abnormality aside for mild tonsillitis.  No evidence of deep tissue space infection.  Discussed with patient home care for tonsillitis with supportive treatment for comfort.  Do not feel antibiotics will be of benefit at this time.  Discussed that she may restart her Nexium, there may be some component of reflux that is worsening her symptoms.  Discussed outpatient follow-up and return precautions.         Final Clinical Impression(s) / ED Diagnoses Final diagnoses:  Dysphagia, unspecified type  Tonsillitis    Rx / DC Orders ED Discharge Orders      None         Quintella Reichert, MD 08/20/21 [phone]

## 2021-08-19 NOTE — ED Triage Notes (Signed)
Pt states that her lymph nodes are swollen and it is making it difficulty to swallow.

## 2021-08-20 ENCOUNTER — Encounter (HOSPITAL_COMMUNITY): Payer: Self-pay

## 2021-08-20 ENCOUNTER — Emergency Department (HOSPITAL_COMMUNITY): Payer: BC Managed Care – PPO

## 2021-08-20 DIAGNOSIS — R1310 Dysphagia, unspecified: Secondary | ICD-10-CM | POA: Diagnosis not present

## 2021-08-20 DIAGNOSIS — R079 Chest pain, unspecified: Secondary | ICD-10-CM | POA: Diagnosis not present

## 2021-08-20 DIAGNOSIS — J039 Acute tonsillitis, unspecified: Secondary | ICD-10-CM | POA: Diagnosis not present

## 2021-08-20 LAB — CBC WITH DIFFERENTIAL/PLATELET
Abs Immature Granulocytes: 0.02 10*3/uL (ref 0.00–0.07)
Basophils Absolute: 0 10*3/uL (ref 0.0–0.1)
Basophils Relative: 0 %
Eosinophils Absolute: 0.1 10*3/uL (ref 0.0–0.5)
Eosinophils Relative: 2 %
HCT: 43.1 % (ref 36.0–46.0)
Hemoglobin: 14.5 g/dL (ref 12.0–15.0)
Immature Granulocytes: 0 %
Lymphocytes Relative: 43 %
Lymphs Abs: 2.9 10*3/uL (ref 0.7–4.0)
MCH: 32.7 pg (ref 26.0–34.0)
MCHC: 33.6 g/dL (ref 30.0–36.0)
MCV: 97.3 fL (ref 80.0–100.0)
Monocytes Absolute: 0.4 10*3/uL (ref 0.1–1.0)
Monocytes Relative: 5 %
Neutro Abs: 3.3 10*3/uL (ref 1.7–7.7)
Neutrophils Relative %: 50 %
Platelets: 264 10*3/uL (ref 150–400)
RBC: 4.43 MIL/uL (ref 3.87–5.11)
RDW: 12.2 % (ref 11.5–15.5)
WBC: 6.7 10*3/uL (ref 4.0–10.5)
nRBC: 0 % (ref 0.0–0.2)

## 2021-08-20 LAB — COMPREHENSIVE METABOLIC PANEL
ALT: 16 U/L (ref 0–44)
AST: 15 U/L (ref 15–41)
Albumin: 3.7 g/dL (ref 3.5–5.0)
Alkaline Phosphatase: 45 U/L (ref 38–126)
Anion gap: 7 (ref 5–15)
BUN: 12 mg/dL (ref 6–20)
CO2: 25 mmol/L (ref 22–32)
Calcium: 8.7 mg/dL — ABNORMAL LOW (ref 8.9–10.3)
Chloride: 102 mmol/L (ref 98–111)
Creatinine, Ser: 0.75 mg/dL (ref 0.44–1.00)
GFR, Estimated: >60 mL/min (ref >60)
Glucose, Bld: 85 mg/dL (ref 70–99)
Potassium: 3.8 mmol/L (ref 3.5–5.1)
Sodium: 134 mmol/L — ABNORMAL LOW (ref 135–145)
Total Bilirubin: 0.4 mg/dL (ref 0.3–1.2)
Total Protein: 6.3 g/dL — ABNORMAL LOW (ref 6.5–8.1)

## 2021-08-20 LAB — I-STAT BETA HCG BLOOD, ED (MC, WL, AP ONLY): I-stat hCG, quantitative: <5 m[IU]/mL (ref <5)

## 2021-08-20 LAB — LIPASE, BLOOD: Lipase: 36 U/L (ref 11–51)

## 2021-08-20 MED ORDER — IOHEXOL 300 MG/ML  SOLN
75.0000 mL | Freq: Once | INTRAMUSCULAR | Status: AC | PRN
  Administered 2021-08-20: 75 mL via INTRAVENOUS

## 2021-08-20 NOTE — ED Notes (Signed)
All discharge instructions reviewed with patient and patient verbalized understanding. Patient stable and ambulatory at time of discharge.  °

## 2021-08-20 NOTE — Discharge Instructions (Addendum)
Get help right away if: °You develop any new symptoms, such as vomiting, severe headache, stiff neck, chest pain, trouble breathing, or trouble swallowing. °You have severe throat pain along with drooling or voice changes. °You have severe pain that is not controlled with medicines. °You cannot fully open your mouth. °You develop redness, swelling, or severe pain anywhere in your neck. °

## 2021-08-20 NOTE — ED Notes (Signed)
Patient transported to X-ray 

## 2021-08-20 NOTE — ED Notes (Signed)
Patient transported to CT 

## 2021-09-05 DIAGNOSIS — M26622 Arthralgia of left temporomandibular joint: Secondary | ICD-10-CM | POA: Diagnosis not present

## 2021-09-05 DIAGNOSIS — H9202 Otalgia, left ear: Secondary | ICD-10-CM | POA: Diagnosis not present

## 2021-09-05 DIAGNOSIS — J343 Hypertrophy of nasal turbinates: Secondary | ICD-10-CM | POA: Diagnosis not present

## 2021-09-05 DIAGNOSIS — R1313 Dysphagia, pharyngeal phase: Secondary | ICD-10-CM | POA: Diagnosis not present

## 2021-09-11 ENCOUNTER — Other Ambulatory Visit: Payer: Self-pay | Admitting: Physician Assistant

## 2021-09-11 DIAGNOSIS — K219 Gastro-esophageal reflux disease without esophagitis: Secondary | ICD-10-CM | POA: Diagnosis not present

## 2021-09-11 DIAGNOSIS — K589 Irritable bowel syndrome without diarrhea: Secondary | ICD-10-CM | POA: Diagnosis not present

## 2021-09-11 DIAGNOSIS — R1310 Dysphagia, unspecified: Secondary | ICD-10-CM | POA: Diagnosis not present

## 2021-09-11 DIAGNOSIS — K224 Dyskinesia of esophagus: Secondary | ICD-10-CM | POA: Diagnosis not present

## 2021-09-20 DIAGNOSIS — Z79899 Other long term (current) drug therapy: Secondary | ICD-10-CM | POA: Diagnosis not present

## 2021-09-20 DIAGNOSIS — F411 Generalized anxiety disorder: Secondary | ICD-10-CM | POA: Diagnosis not present

## 2021-09-20 DIAGNOSIS — F988 Other specified behavioral and emotional disorders with onset usually occurring in childhood and adolescence: Secondary | ICD-10-CM | POA: Diagnosis not present

## 2021-09-20 DIAGNOSIS — F4310 Post-traumatic stress disorder, unspecified: Secondary | ICD-10-CM | POA: Diagnosis not present

## 2021-09-20 DIAGNOSIS — Z6832 Body mass index (BMI) 32.0-32.9, adult: Secondary | ICD-10-CM | POA: Diagnosis not present

## 2021-09-21 ENCOUNTER — Other Ambulatory Visit: Payer: Medicaid Other

## 2021-10-08 ENCOUNTER — Other Ambulatory Visit: Payer: Medicaid Other

## 2021-12-06 DIAGNOSIS — F988 Other specified behavioral and emotional disorders with onset usually occurring in childhood and adolescence: Secondary | ICD-10-CM | POA: Diagnosis not present

## 2021-12-06 DIAGNOSIS — F411 Generalized anxiety disorder: Secondary | ICD-10-CM | POA: Diagnosis not present

## 2021-12-06 DIAGNOSIS — Z79899 Other long term (current) drug therapy: Secondary | ICD-10-CM | POA: Diagnosis not present

## 2021-12-06 DIAGNOSIS — Z6831 Body mass index (BMI) 31.0-31.9, adult: Secondary | ICD-10-CM | POA: Diagnosis not present

## 2021-12-06 DIAGNOSIS — F4310 Post-traumatic stress disorder, unspecified: Secondary | ICD-10-CM | POA: Diagnosis not present

## 2022-02-05 ENCOUNTER — Other Ambulatory Visit: Payer: Self-pay

## 2022-02-05 ENCOUNTER — Ambulatory Visit
Admission: RE | Admit: 2022-02-05 | Discharge: 2022-02-05 | Disposition: A | Discharge status: Home / Self Care | Payer: BC Managed Care – PPO | Source: Ambulatory Visit | Attending: Family Medicine | Admitting: Family Medicine

## 2022-02-05 VITALS — BP 131/84 | HR 90 | Temp 98.6°F | Resp 18

## 2022-02-05 DIAGNOSIS — N760 Acute vaginitis: Secondary | ICD-10-CM | POA: Insufficient documentation

## 2022-02-05 DIAGNOSIS — A5901 Trichomonal vulvovaginitis: Secondary | ICD-10-CM | POA: Insufficient documentation

## 2022-02-05 MED ORDER — METRONIDAZOLE 500 MG PO TABS: 500.0000 mg | ORAL_TABLET | Freq: Two times a day (BID) | ORAL | 0 refills | Status: DC

## 2022-02-05 MED ORDER — FLUCONAZOLE 150 MG PO TABS: 150.0000 mg | ORAL_TABLET | ORAL | 0 refills | Status: DC

## 2022-02-05 NOTE — ED Triage Notes (Addendum)
Pt reports vaginal itching with intermittent white discharge for last several weeks. Pt reports has been taking several abx recently due to dental problems. Pt reports last course of abx x1 week ago. Pt reports has tried otc suppositories and one difilucan that repots didn't help. Pt reports last few days has also noticed "blood on tissue when wiping." Pt reports has been unable to get in with OB.

## 2022-02-05 NOTE — Discharge Instructions (Signed)
May try boric acid suppositories vaginally daily as needed and feminine pH probiotics

## 2022-02-05 NOTE — ED Provider Notes (Signed)
[facility]    CSN: [id number] Arrival date & time: 02/05/22  0934      History   Chief Complaint Chief Complaint  Patient presents with   Vaginal Itching    HPI [name] is a 36 y.o. female.   Pt reports vaginal itching with intermittent white discharge for last several weeks. Pt reports has been taking several abx recently due to dental problems. Pt reports last course of abx x1 week ago. Pt reports has tried otc suppositories and one difilucan that repots didn't help. Pt reports last few days has also noticed "blood on tissue when wiping." Pt reports has been unable to get in with OB.       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   Endometriosis 09/16/2012   CERVICALGIA 09/08/2007   CERVICAL SPASM 09/08/2007    Past Surgical History:  Procedure Laterality Date   BRAIN SURGERY     CESAREAN SECTION  09/05/2008   CYSTOSCOPY N/A 09/15/2012    Procedure: CYSTOSCOPY;  Surgeon: Roselle Locus II, MD;  Location: [facility];  Service: Gynecology;  Laterality: N/A;   DILATION AND EVACUATION  04/15/2007   HYSTEROSCOPY WITH D & C  03/31/2012   Procedure: DILATATION AND CURETTAGE /HYSTEROSCOPY;  Surgeon: Leslie Andrea, MD;  Location: [facility];  Service: Gynecology;  Laterality: N/A;   LAPAROSCOPIC APPENDECTOMY N/A 10/29/2016   Procedure: APPENDECTOMY LAPAROSCOPIC;  Surgeon: Franky Macho, MD;  Location: [facility];  Service: General;  Laterality: N/A;   LAPAROSCOPIC ASSISTED VAGINAL HYSTERECTOMY N/A 09/15/2012   Procedure: LAPAROSCOPIC ASSISTED VAGINAL HYSTERECTOMY;  Surgeon: Leslie Andrea, MD;  Location: [facility];  Service: Gynecology;  Laterality: N/A;   LAPAROSCOPY  03/31/2012   Procedure: LAPAROSCOPY OPERATIVE;  Surgeon: Leslie Andrea, MD;  Location: [facility];  Service: Gynecology;  Laterality: N/A;  with Biopsies of Bilateral Fallopian Tubes; Fulguration of Endometriosis   SHOULDER ARTHROSCOPY WITH CAPSULORRHAPHY Right 06/28/2015   Procedure: RIGHT SHOULDER ARTHROSCOPY WITH CAPSULORRHAPHY;  Surgeon: Frederico Hamman, MD;  Location: [facility];  Service: Orthopedics;  Laterality: Right;   SUBOCCIPITAL CRANIECTOMY CERVICAL LAMINECTOMY N/A 08/08/2016   Procedure: SUBOCCIPITAL CRANIECTOMY CERVICAL LAMINECTOMY NUMBER DURAPLASTY;  Surgeon: Tressie Stalker, MD;  Location: [facility];  Service: Neurosurgery;  Laterality: N/A;  suboccipital   ULNAR NERVE REPAIR Right 05/2015  OB History     Gravida  2   Para  1   Term  1   Preterm      AB  1   Living  1      SAB  1   IAB      Ectopic      Multiple      Live Births               Home Medications    Prior to Admission medications   Medication Sig Start Date End Date Taking? Authorizing Provider  fluconazole (DIFLUCAN) 150 MG tablet Take 1 tablet (150 mg total) by mouth every other day. 02/05/22  Yes Particia Nearing, PA-C  metroNIDAZOLE (FLAGYL) 500 MG tablet  Take 1 tablet (500 mg total) by mouth 2 (two) times daily. 02/05/22  Yes Particia Nearing, PA-C  amphetamine-dextroamphetamine (ADDERALL) 20 MG tablet Take 20 mg by mouth daily.     [provider]  benzonatate (TESSALON) 200 MG capsule Take 1 capsule (200 mg total) by mouth 3 (three) times daily as needed for cough. Swallow whole, do not chew Patient not taking: No sig reported 08/19/20   Triplett, Tammy, PA-C  buPROPion (WELLBUTRIN SR) 100 MG 12 hr tablet Wellbutrin SR 100 mg tablet, 12 hr sustained-release  Take 1 tablet every day by oral route. Patient not taking: No sig reported    [provider]  dicyclomine (BENTYL) 10 MG capsule Take 10 mg by mouth 4 (four) times daily -  before meals and at bedtime. Patient not taking: No sig reported    [provider]  Esomeprazole Magnesium (NEXIUM) 5 MG PACK Take 5 mg by mouth daily. 12/22/18   Gilda Crease, MD  famotidine (PEPCID) 20 MG tablet Take 1 tablet (20 mg total) by mouth 2 (two) times daily. Patient not taking: No sig reported 12/16/18   Vanetta Mulders, MD  Fremanezumab-vfrm (AJOVY) 225 MG/1.5ML SOSY Inject 225 mg into the skin every 28 (twenty-eight) days. 10/31/20   Drema Dallas, DO  ibuprofen (ADVIL) 800 MG tablet Take 1 tablet (800 mg total) by mouth 3 (three) times daily. Patient not taking: No sig reported 08/19/20   Triplett, Tammy, PA-C  metroNIDAZOLE (FLAGYL) 250 MG tablet Take 250 mg by mouth 3 (three) times daily. Patient not taking: No sig reported    [provider]  sucralfate (CARAFATE) 1 GM/10ML suspension Take 10 mLs (1 g total) by mouth 4 (four) times daily -  with meals and at bedtime. 06/26/21   Benjiman Core, MD  Ubrogepant (UBRELVY) 100 MG TABS Take 1 tablet by mouth as needed (May repeat in 2 hours. Maximum 2 tablets in 24 hours). 12/20/20   Drema Dallas, DO    Family History Family History  Problem Relation Age of Onset   Anesthesia problems Father        hard  to wake up post-op   Stroke Father    Varicose Veins Mother    Varicose Veins Maternal Grandmother     Social History Social History   Tobacco Use   Smoking status: Every Day    Packs/day: 1.00    Years: 10.00    Total pack years: 10.00    Types: Cigarettes   Smokeless tobacco: Never   Tobacco comments:    quit 2 months ago  Vaping Use   Vaping Use: Never used  Substance Use Topics   Alcohol use: Yes    Comment: occasionally  Drug use: No   Allergies   Ciprofloxacin, Dilaudid [hydromorphone hcl], Morphine, Morphine sulfate, Other, Adhesive [tape], Amoxicillin, and Morphine and related  Review of Systems Review of Systems PER HPI  Physical Exam Triage Vital Signs ED Triage Vitals  Enc Vitals Group     BP 02/05/22 0953 131/84     Pulse Rate 02/05/22 0953 90     Resp 02/05/22 0953 18     Temp 02/05/22 0953 98.6 F (37 C)     Temp Source 02/05/22 0953 Oral     SpO2 02/05/22 0953 98 %     Weight --      Height --      Head Circumference --      Peak Flow --      Pain Score 02/05/22 0954 0     Pain Loc --      Pain Edu? --      Excl. in GC? --    No data found.  Updated Vital Signs BP 131/84 (BP Location: Right Arm)   Pulse 90   Temp 98.6 F (37 C) (Oral)   Resp 18   LMP 08/18/2012   SpO2 98%   Visual Acuity Right Eye Distance:   Left Eye Distance:   Bilateral Distance:    Right Eye Near:   Left Eye Near:    Bilateral Near:     Physical Exam Vitals and nursing note reviewed. Exam conducted with a chaperone present.  Constitutional:      Appearance: Normal appearance. She is not ill-appearing.  HENT:     Head: Atraumatic.  Eyes:     Extraocular Movements: Extraocular movements intact.     Conjunctiva/sclera: Conjunctivae normal.  Cardiovascular:     Rate and Rhythm: Normal rate and regular rhythm.     Heart sounds: Normal heart sounds.  Pulmonary:     Effort: Pulmonary effort is normal.     Breath sounds: Normal breath sounds.   Abdominal:     General: Bowel sounds are normal. There is no distension.     Palpations: Abdomen is soft.     Tenderness: There is no abdominal tenderness. There is no guarding.  Genitourinary:    Comments: Vulvar erythema and edema, thin white discharge present Musculoskeletal:        General: Normal range of motion.     Cervical back: Normal range of motion and neck supple.  Skin:    General: Skin is warm and dry.  Neurological:     Mental Status: She is alert and oriented to person, place, and time.     Motor: No weakness.     Gait: Gait normal.  Psychiatric:        Mood and Affect: Mood normal.        Thought Content: Thought content normal.        Judgment: Judgment normal.    UC Treatments / Results  Labs (all labs ordered are listed, but only abnormal results are displayed) Labs Reviewed  CERVICOVAGINAL ANCILLARY ONLY    EKG   Radiology No results found.  Procedures Procedures (including critical care time)  Medications Ordered in UC Medications - No data to display  Initial Impression / Assessment and Plan / UC Course  I have reviewed the triage vital signs and the nursing notes.  Pertinent labs & imaging results that were available during my care of the patient were reviewed by me and considered in my medical decision making (see chart for details).  Vaginal swab pending, suspect BV infection but will treat for BV and yeast today while awaiting results.  Also discussed to avoid douching which she has been doing here and there and to try boric acid suppositories, feminine pH probiotics.  Final Clinical Impressions(s) / UC Diagnoses   Final diagnoses:  Acute vaginitis     Discharge Instructions      May try boric acid suppositories vaginally daily as needed and feminine pH probiotics    ED Prescriptions     Medication Sig Dispense Auth. Provider   fluconazole (DIFLUCAN) 150 MG tablet Take 1 tablet (150 mg total) by mouth every other day. 5  tablet Particia Nearing, [place]   metroNIDAZOLE (FLAGYL) 500 MG tablet Take 1 tablet (500 mg total) by mouth 2 (two) times daily. 14 tablet Particia Nearing, [place]      PDMP not reviewed this encounter.   Particia Nearing, [place] 02/05/22 1053

## 2022-02-06 LAB — CERVICOVAGINAL ANCILLARY ONLY
Bacterial Vaginitis (gardnerella): POSITIVE — AB
Candida Glabrata: NEGATIVE
Candida Vaginitis: NEGATIVE
Chlamydia: NEGATIVE
Comment: NEGATIVE
Comment: NEGATIVE
Comment: NEGATIVE
Comment: NEGATIVE
Comment: NEGATIVE
Comment: NORMAL
Neisseria Gonorrhea: NEGATIVE
Trichomonas: POSITIVE — AB

## 2022-02-20 ENCOUNTER — Telehealth: Payer: BC Managed Care – PPO | Admitting: Physician Assistant

## 2022-02-20 DIAGNOSIS — B9689 Other specified bacterial agents as the cause of diseases classified elsewhere: Secondary | ICD-10-CM | POA: Diagnosis not present

## 2022-02-20 DIAGNOSIS — N760 Acute vaginitis: Secondary | ICD-10-CM | POA: Diagnosis not present

## 2022-02-20 DIAGNOSIS — A599 Trichomoniasis, unspecified: Secondary | ICD-10-CM

## 2022-02-20 MED ORDER — TINIDAZOLE 500 MG PO TABS: 2.0000 g | ORAL_TABLET | Freq: Once | ORAL | 0 refills | Status: AC

## 2022-02-20 NOTE — Patient Instructions (Signed)
[name], thank you for joining [name], PA-C for today's virtual visit.  While this provider is not your primary care provider (PCP), if your PCP is located in our provider database this encounter information will be shared with them immediately following your visit.  Consent: (Patient) [name] provided verbal consent for this virtual visit at the beginning of the encounter.  Current Medications:  Current Outpatient Medications:    tinidazole (TINDAMAX) 500 MG tablet, Take 4 tablets (2,000 mg total) by mouth once for 1 dose., Disp: 4 tablet, Rfl: 0   amphetamine-dextroamphetamine (ADDERALL) 20 MG tablet, Take 20 mg by mouth daily. , Disp: , Rfl:    benzonatate (TESSALON) 200 MG capsule, Take 1 capsule (200 mg total) by mouth 3 (three) times daily as needed for cough. Swallow whole, do not chew (Patient not taking: No sig reported), Disp: 21 capsule, Rfl: 0   buPROPion (WELLBUTRIN SR) 100 MG 12 hr tablet, Wellbutrin SR 100 mg tablet, 12 hr sustained-release  Take 1 tablet every day by oral route. (Patient not taking: No sig reported), Disp: , Rfl:    dicyclomine (BENTYL) 10 MG capsule, Take 10 mg by mouth 4 (four) times daily -  before meals and at bedtime. (Patient not taking: No sig reported), Disp: , Rfl:    Esomeprazole Magnesium (NEXIUM) 5 MG PACK, Take 5 mg by mouth daily., Disp: 120 each, Rfl: 2   famotidine (PEPCID) 20 MG tablet, Take 1 tablet (20 mg total) by mouth 2 (two) times daily. (Patient not taking: No sig reported), Disp: 30 tablet, Rfl: 0   Fremanezumab-vfrm (AJOVY) 225 MG/1.5ML SOSY, Inject 225 mg into the skin every 28 (twenty-eight) days., Disp: 1.68 mL, Rfl: 5   ibuprofen (ADVIL) 800 MG tablet, Take 1 tablet (800 mg total) by mouth 3 (three) times daily. (Patient not taking: No sig reported), Disp: 21 tablet, Rfl: 0   sucralfate (CARAFATE) 1 GM/10ML suspension, Take 10 mLs (1 g total) by mouth 4 (four) times daily -  with meals and at bedtime.,  Disp: 420 mL, Rfl: 0   Ubrogepant (UBRELVY) 100 MG TABS, Take 1 tablet by mouth as needed (May repeat in 2 hours. Maximum 2 tablets in 24 hours)., Disp: 16 tablet, Rfl: 5   Medications ordered in this encounter:  Meds ordered this encounter  Medications   tinidazole (TINDAMAX) 500 MG tablet    Sig: Take 4 tablets (2,000 mg total) by mouth once for 1 dose.    Dispense:  4 tablet    Refill:  0    Order Specific Question:   Supervising Provider    Answer:   Hyacinth Meeker, BRIAN [[id number]]     *If you need refills on other medications prior to your next appointment, please contact your pharmacy*  Follow-Up: Call back or seek an in-person evaluation if the symptoms worsen or if the condition fails to improve as anticipated.  Other Instructions Take the Tinidazole as directed, taking all tablets. Follow-up with your GYN as scheduled. They can repeat testing at that time. Follow-up sooner if symptoms are not resolving or you note any new/worsening symptoms. Continue to refrain from any sexual activity until symptoms are fully resolved and you have had a repeat assessment.    If you have been instructed to have an in-person evaluation today at a local Urgent Care facility, please use the link below. It will take you to a list of all of our available [facility], including address, phone number  and hours of operation. Please do not delay care.  [facility]  If you or a family member do not have a primary care provider, use the link below to schedule a visit and establish care. When you choose a [facility] primary care physician or advanced practice provider, you gain a long-term partner in health. Find a Primary Care Provider  Learn more about [facility]'s in-office and virtual care options: [facility]

## 2022-02-20 NOTE — Progress Notes (Signed)
Virtual Visit Consent   [name], you are scheduled for a virtual visit with a [facility] provider today. Just as with appointments in the office, your consent must be obtained to participate. Your consent will be active for this visit and any virtual visit you may have with one of our providers in the next 365 days. If you have a MyChart account, a copy of this consent can be sent to you electronically.  As this is a virtual visit, video technology does not allow for your provider to perform a traditional examination. This may limit your provider's ability to fully assess your condition. If your provider identifies any concerns that need to be evaluated in person or the need to arrange testing (such as labs, EKG, etc.), we will make arrangements to do so. Although advances in technology are sophisticated, we cannot ensure that it will always work on either your end or our end. If the connection with a video visit is poor, the visit may have to be switched to a telephone visit. With either a video or telephone visit, we are not always able to ensure that we have a secure connection.  By engaging in this virtual visit, you consent to the provision of healthcare and authorize for your insurance to be billed (if applicable) for the services provided during this visit. Depending on your insurance coverage, you may receive a charge related to this service.  I need to obtain your verbal consent now. Are you willing to proceed with your visit today? [name] has provided verbal consent on 02/20/2022 for a virtual visit (video or telephone). [name], [place]  Date: 02/20/2022 1:46 PM  Virtual Visit via Video Note   I, [name], connected with  MIOSOTIS WETSEL  ([id number], 09-23-1985) on 02/20/22 at  1:45 PM EDT by a video-enabled telemedicine application and verified that I am speaking with the correct person using two identifiers.  Location: Patient: Virtual Visit  Location Patient: Home Provider: Virtual Visit Location Provider: Home Office   I discussed the limitations of evaluation and management by telemedicine and the availability of in person appointments. The patient expressed understanding and agreed to proceed.    History of Present Illness: [name] is a 36 y.o. who identifies as a female who was assigned female at birth, and is being seen today for concern of continues infection. Was seen at UC earlier this month for suspected BV. Started on course of Flagyl. Testing came back positive for BV but also trich. She was instructed to finish her course of medication and refrain from activity until repeat testing. Notes she took most of her medicine but then lost her medicine. Was feeling fine but few days later with recurrence of symptoms. Has not been sexually active since diagnosis. Has been trying to get in touch with UC she was seen at but says she has not been given a return call.   HPI: HPI  Problems:  Patient Active Problem List   Diagnosis Date Noted   Intractable chronic migraine without aura and with status migrainosus 03/24/2017   Acute appendicitis    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   Endometriosis 09/16/2012   CERVICALGIA 09/08/2007   CERVICAL SPASM 09/08/2007    Allergies:  Allergies  Allergen Reactions   Ciprofloxacin Shortness Of Breath   Dilaudid [Hydromorphone Hcl] Shortness Of Breath   Morphine    Morphine Sulfate Other (See Comments)   Other Other (See Comments)  Adhesive [Tape] Rash   Amoxicillin Rash   Morphine And Related Itching and Nausea And Vomiting    Have to give benadryl with morphine, also nausea/vomiting   Medications:  Current Outpatient Medications:    tinidazole (TINDAMAX) 500 MG tablet, Take 4 tablets (2,000 mg total) by mouth once for 1 dose., Disp: 4 tablet, Rfl: 0   amphetamine-dextroamphetamine (ADDERALL) 20 MG tablet, Take 20 mg by mouth daily. , Disp: , Rfl:    benzonatate  (TESSALON) 200 MG capsule, Take 1 capsule (200 mg total) by mouth 3 (three) times daily as needed for cough. Swallow whole, do not chew (Patient not taking: No sig reported), Disp: 21 capsule, Rfl: 0   buPROPion (WELLBUTRIN SR) 100 MG 12 hr tablet, Wellbutrin SR 100 mg tablet, 12 hr sustained-release  Take 1 tablet every day by oral route. (Patient not taking: No sig reported), Disp: , Rfl:    dicyclomine (BENTYL) 10 MG capsule, Take 10 mg by mouth 4 (four) times daily -  before meals and at bedtime. (Patient not taking: No sig reported), Disp: , Rfl:    Esomeprazole Magnesium (NEXIUM) 5 MG PACK, Take 5 mg by mouth daily., Disp: 120 each, Rfl: 2   famotidine (PEPCID) 20 MG tablet, Take 1 tablet (20 mg total) by mouth 2 (two) times daily. (Patient not taking: No sig reported), Disp: 30 tablet, Rfl: 0   Fremanezumab-vfrm (AJOVY) 225 MG/1.5ML SOSY, Inject 225 mg into the skin every 28 (twenty-eight) days., Disp: 1.68 mL, Rfl: 5   ibuprofen (ADVIL) 800 MG tablet, Take 1 tablet (800 mg total) by mouth 3 (three) times daily. (Patient not taking: No sig reported), Disp: 21 tablet, Rfl: 0   sucralfate (CARAFATE) 1 GM/10ML suspension, Take 10 mLs (1 g total) by mouth 4 (four) times daily -  with meals and at bedtime., Disp: 420 mL, Rfl: 0   Ubrogepant (UBRELVY) 100 MG TABS, Take 1 tablet by mouth as needed (May repeat in 2 hours. Maximum 2 tablets in 24 hours)., Disp: 16 tablet, Rfl: 5  Observations/Objective: Patient is well-developed, well-nourished in no acute distress.  Resting comfortably at home.  Head is normocephalic, atraumatic.  No labored breathing. Speech is clear and coherent with logical content.  Patient is alert and oriented at baseline.   Assessment and Plan: 1. Trichomoniasis - tinidazole (TINDAMAX) 500 MG tablet; Take 4 tablets (2,000 mg total) by mouth once for 1 dose.  Dispense: 4 tablet; Refill: 0  2. BV (bacterial vaginosis) - tinidazole (TINDAMAX) 500 MG tablet; Take 4 tablets  (2,000 mg total) by mouth once for 1 dose.  Dispense: 4 tablet; Refill: 0  Incomplete initial course of treatment due to loss of medication. No risk of re-exposure. Hard time with GI issues with the Flagyl itself. Will give course of Tinidazole to take as directed. She has scheduled follow-up with GYN for annual and can get repeat testing then. Follow-up sooner if symptoms not resolving.   Follow Up Instructions: I discussed the assessment and treatment plan with the patient. The patient was provided an opportunity to ask questions and all were answered. The patient agreed with the plan and demonstrated an understanding of the instructions.  A copy of instructions were sent to the patient via MyChart unless otherwise noted below.   The patient was advised to call back or seek an in-person evaluation if the symptoms worsen or if the condition fails to improve as anticipated.  Time:  I spent 10 minutes with the patient via telehealth  technology discussing the above problems/concerns.    Monica Climes, PA-C

## 2022-03-13 DIAGNOSIS — K219 Gastro-esophageal reflux disease without esophagitis: Secondary | ICD-10-CM | POA: Diagnosis not present

## 2022-03-13 DIAGNOSIS — F900 Attention-deficit hyperactivity disorder, predominantly inattentive type: Secondary | ICD-10-CM | POA: Diagnosis not present

## 2022-03-13 DIAGNOSIS — F17210 Nicotine dependence, cigarettes, uncomplicated: Secondary | ICD-10-CM | POA: Diagnosis not present

## 2022-03-13 DIAGNOSIS — R21 Rash and other nonspecific skin eruption: Secondary | ICD-10-CM | POA: Diagnosis not present

## 2022-04-02 ENCOUNTER — Emergency Department (HOSPITAL_COMMUNITY)
Admission: EM | Admit: 2022-04-02 | Discharge: 2022-04-02 | Disposition: A | Discharge status: Home / Self Care | Payer: BC Managed Care – PPO | Attending: Emergency Medicine | Admitting: Emergency Medicine

## 2022-04-02 ENCOUNTER — Encounter (HOSPITAL_COMMUNITY): Payer: Self-pay | Admitting: Emergency Medicine

## 2022-04-02 ENCOUNTER — Other Ambulatory Visit: Payer: Self-pay

## 2022-04-02 DIAGNOSIS — Z20822 Contact with and (suspected) exposure to covid-19: Secondary | ICD-10-CM | POA: Diagnosis not present

## 2022-04-02 DIAGNOSIS — J020 Streptococcal pharyngitis: Secondary | ICD-10-CM | POA: Diagnosis not present

## 2022-04-02 DIAGNOSIS — J029 Acute pharyngitis, unspecified: Secondary | ICD-10-CM | POA: Diagnosis not present

## 2022-04-02 LAB — GROUP A STREP BY PCR: Group A Strep by PCR: DETECTED — AB

## 2022-04-02 LAB — RESP PANEL BY RT-PCR (FLU A&B, COVID) ARPGX2
Influenza A by PCR: NEGATIVE
Influenza B by PCR: NEGATIVE
SARS Coronavirus 2 by RT PCR: NEGATIVE

## 2022-04-02 MED ORDER — LIDOCAINE VISCOUS HCL 2 % MT SOLN: 15.0000 mL | Freq: Once | OROMUCOSAL | Status: DC

## 2022-04-02 MED ORDER — AZITHROMYCIN 250 MG PO TABS: 250.0000 mg | ORAL_TABLET | Freq: Every day | ORAL | 0 refills | Status: DC

## 2022-04-02 MED ORDER — DEXAMETHASONE SODIUM PHOSPHATE 10 MG/ML IJ SOLN: 10.0000 mg | Freq: Once | INTRAMUSCULAR | Status: DC

## 2022-04-02 MED ORDER — KETOROLAC TROMETHAMINE 30 MG/ML IJ SOLN: 30.0000 mg | Freq: Once | INTRAMUSCULAR | Status: DC

## 2022-04-02 NOTE — ED Provider Notes (Signed)
[facility] EMERGENCY DEPARTMENT Provider Note   CSN: [id number] Arrival date & time: 04/02/22  0757     History  Chief Complaint  Patient presents with   Sore Throat    [name] is a 36 y.o. female.  Pt is a 36 yo female with a pmhx significant for ptsd, anxiety, migraines and depression.  Pt said she developed a sore throat yesterday.  She had a left over antibiotic which she took yesterday evening.  Sore throat is worse today.  Pt denies fever.  It hurts to swallow.  It feels more swollen on the left.       Home Medications Prior to Admission medications   Medication Sig Start Date End Date Taking? Authorizing Provider  amphetamine-dextroamphetamine (ADDERALL) 20 MG tablet Take 20 mg by mouth daily.    Yes [provider]  azithromycin (ZITHROMAX) 250 MG tablet Take 1 tablet (250 mg total) by mouth daily. Take first 2 tablets together, then 1 every day until finished. 04/02/22  Yes Isla Pence, MD  esomeprazole (NEXIUM) 40 MG packet Take 40 mg by mouth daily. 03/26/22  Yes [provider]  benzonatate (TESSALON) 200 MG capsule Take 1 capsule (200 mg total) by mouth 3 (three) times daily as needed for cough. Swallow whole, do not chew Patient not taking: Reported on 10/05/2020 08/19/20   Kem Parkinson, PA-C  buPROPion [facility] SR) 100 MG 12 hr tablet Wellbutrin SR 100 mg tablet, 12 hr sustained-release  Take 1 tablet every day by oral route. Patient not taking: No sig reported    [provider]  dicyclomine (BENTYL) 10 MG capsule Take 10 mg by mouth 4 (four) times daily -  before meals and at bedtime. Patient not taking: Reported on 04/18/2020    [provider]  famotidine (PEPCID) 20 MG tablet Take 1 tablet (20 mg total) by mouth 2 (two) times daily. Patient not taking: Reported on 04/18/2020 12/16/18   Fredia Sorrow, MD  Fremanezumab-vfrm (AJOVY) 225 MG/1.5ML SOSY Inject 225 mg into the skin every 28 (twenty-eight)  days. Patient not taking: Reported on 04/02/2022 10/31/20   Pieter Partridge, DO  ibuprofen (ADVIL) 800 MG tablet Take 1 tablet (800 mg total) by mouth 3 (three) times daily. Patient not taking: Reported on 10/05/2020 08/19/20   Triplett, Tammy, PA-C  sucralfate (CARAFATE) 1 GM/10ML suspension Take 10 mLs (1 g total) by mouth 4 (four) times daily -  with meals and at bedtime. Patient not taking: Reported on 04/02/2022 06/26/21   Davonna Belling, MD  Ubrogepant (UBRELVY) 100 MG TABS Take 1 tablet by mouth as needed (May repeat in 2 hours. Maximum 2 tablets in 24 hours). Patient not taking: Reported on 04/02/2022 12/20/20   Pieter Partridge, DO      Allergies    Ciprofloxacin, Dilaudid [hydromorphone hcl], Morphine, Morphine sulfate, Other, Adhesive [tape], Amoxicillin, and Morphine and related    Review of Systems   Review of Systems  HENT:  Positive for sore throat.   All other systems reviewed and are negative.   Physical Exam Updated Vital Signs BP 127/87   Pulse 100   Temp 98.7 F (37.1 C) (Oral)   Resp 16   Ht 5\' 7"  (1.702 m)   Wt 90.7 kg   LMP 08/18/2012   SpO2 100%   BMI 31.32 kg/m  Physical Exam Vitals and nursing note reviewed.  Constitutional:      Appearance: She is well-developed.  HENT:     Head: Normocephalic  and atraumatic.     Mouth/Throat:     Mouth: Mucous membranes are moist.     Pharynx: Uvula midline. Oropharyngeal exudate present.     Tonsils: No tonsillar abscesses.  Eyes:     Conjunctiva/sclera: Conjunctivae normal.     Pupils: Pupils are equal, round, and reactive to light.  Cardiovascular:     Rate and Rhythm: Normal rate and regular rhythm.     Heart sounds: Normal heart sounds.  Pulmonary:     Effort: Pulmonary effort is normal.     Breath sounds: Normal breath sounds.  Abdominal:     General: Bowel sounds are normal.     Palpations: Abdomen is soft.  Lymphadenopathy:     Cervical: Cervical adenopathy present.     Left cervical: Superficial  cervical adenopathy present.  Skin:    General: Skin is warm.     Capillary Refill: Capillary refill takes less than 2 seconds.  Neurological:     General: No focal deficit present.     Mental Status: She is alert and oriented to person, place, and time.  Psychiatric:        Mood and Affect: Mood normal.        Behavior: Behavior normal.     ED Results / Procedures / Treatments   Labs (all labs ordered are listed, but only abnormal results are displayed) Labs Reviewed  GROUP A STREP BY PCR - Abnormal; Notable for the following components:      Result Value   Group A Strep by PCR DETECTED (*)    All other components within normal limits  RESP PANEL BY RT-PCR (FLU A&B, COVID) ARPGX2    EKG None  Radiology No results found.  Procedures Procedures    Medications Ordered in ED Medications  ketorolac (TORADOL) 30 MG/ML injection 30 mg (30 mg Intramuscular Not Given 04/02/22 0903)  dexamethasone (DECADRON) injection 10 mg (10 mg Intramuscular Not Given 04/02/22 0904)  lidocaine (XYLOCAINE) 2 % viscous mouth solution 15 mL (15 mLs Mouth/Throat Not Given 04/02/22 5361)    ED Course/ Medical Decision Making/ A&P                           Medical Decision Making Risk Prescription drug management.   This patient presents to the ED for concern of sore throat, this involves an extensive number of treatment options, and is a complaint that carries with it a high risk of complications and morbidity.  The differential diagnosis includes strep, covid, uri   Co morbidities that complicate the patient evaluation  ptsd, anxiety, migraines and depression   Additional history obtained:  Additional history obtained from epic chart review  Lab Tests:  I Ordered, and personally interpreted labs.  The pertinent results include:  strep + and covid/flu neg   Medicines ordered and prescription drug management:  I ordered medication including decadron, toradol, and viscous lidocaine   for pain, but pt refused   I have reviewed the patients home medicines and have made adjustments as needed  Problem List / ED Course:  Strep pharyngitis:  pt was quite grumpy with the nurse when she went to give the patient her meds.  I asked pt why she did not want them, and she said she needs to go to work.  I tried to explain they won't make her tired and that she needs to take some days off work to recover.  She said she has no choice  and has to work, but works from home.  She said she is allergic to pcn, so can't take the bicillin injection.  She is d/c with zithromax.  She is to return if worse.  F/u with pcp.   Reevaluation:  After the interventions noted above, I reevaluated the patient and found that they have :stayed the same   Social Determinants of Health:  Lives at home   Dispostion:  After consideration of the diagnostic results and the patients response to treatment, I feel that the patent would benefit from discharge with outpatient f/u.          Final Clinical Impression(s) / ED Diagnoses Final diagnoses:  Strep pharyngitis    Rx / DC Orders ED Discharge Orders          Ordered    azithromycin (ZITHROMAX) 250 MG tablet  Daily        04/02/22 1006              Jacalyn Lefevre, MD 04/02/22 1009

## 2022-04-02 NOTE — ED Triage Notes (Signed)
Pt to the ED with a sore throat that began yesterday. Pt has a history of strep throat.

## 2022-04-02 NOTE — ED Notes (Signed)
Patient upset refuses care, patient states, ' I don't want this shit, all I want is antibiotics so I can go to work" Redirection and supportive listening unsuccessful patient refuses to allow staff to take care of needs.

## 2022-04-04 ENCOUNTER — Emergency Department (HOSPITAL_COMMUNITY)
Admission: EM | Admit: 2022-04-04 | Discharge: 2022-04-04 | Discharge status: Against Medical Advice | Payer: BC Managed Care – PPO

## 2022-04-04 DIAGNOSIS — K047 Periapical abscess without sinus: Secondary | ICD-10-CM | POA: Diagnosis not present

## 2022-04-04 DIAGNOSIS — J029 Acute pharyngitis, unspecified: Secondary | ICD-10-CM | POA: Diagnosis not present

## 2022-04-06 ENCOUNTER — Encounter (HOSPITAL_COMMUNITY): Payer: Self-pay | Admitting: Emergency Medicine

## 2022-04-06 ENCOUNTER — Emergency Department (HOSPITAL_COMMUNITY)
Admission: EM | Admit: 2022-04-06 | Discharge: 2022-04-06 | Disposition: A | Discharge status: Home / Self Care | Payer: BC Managed Care – PPO | Attending: Emergency Medicine | Admitting: Emergency Medicine

## 2022-04-06 ENCOUNTER — Other Ambulatory Visit: Payer: Self-pay

## 2022-04-06 DIAGNOSIS — Z79899 Other long term (current) drug therapy: Secondary | ICD-10-CM | POA: Insufficient documentation

## 2022-04-06 DIAGNOSIS — K0889 Other specified disorders of teeth and supporting structures: Secondary | ICD-10-CM | POA: Insufficient documentation

## 2022-04-06 MED ORDER — HYDROCODONE-ACETAMINOPHEN 5-325 MG PO TABS: 1.0000 | ORAL_TABLET | Freq: Four times a day (QID) | ORAL | 0 refills | Status: DC | PRN

## 2022-04-06 MED ORDER — CHLORHEXIDINE GLUCONATE 0.12 % MT SOLN: 15.0000 mL | Freq: Two times a day (BID) | OROMUCOSAL | 0 refills | Status: DC

## 2022-04-06 MED ORDER — KETOROLAC TROMETHAMINE 15 MG/ML IJ SOLN
15.0000 mg | Freq: Once | INTRAMUSCULAR | Status: AC
  Administered 2022-04-06: 15 mg via INTRAMUSCULAR
  Filled 2022-04-06: qty 1

## 2022-04-06 MED ORDER — FENTANYL CITRATE PF 50 MCG/ML IJ SOSY
50.0000 ug | PREFILLED_SYRINGE | Freq: Once | INTRAMUSCULAR | Status: AC
  Administered 2022-04-06: 50 ug via INTRAMUSCULAR
  Filled 2022-04-06: qty 1

## 2022-04-06 NOTE — Discharge Instructions (Addendum)
As discussed, it is very important that you follow-up with your dental surgeon on Tuesday.  Return here for concerning changes in your condition.  In addition to the prescribed medication, please use ibuprofen, 400 mg, 3 times daily for additional pain relief.

## 2022-04-06 NOTE — ED Provider Notes (Signed)
[facility] EMERGENCY DEPARTMENT Provider Note   CSN: [id number] Arrival date & time: 04/06/22  1940     History  Chief Complaint  Patient presents with   Dental Pain    [name] is a 36 y.o. female.  HPI Patient presents with pain in the right jaw.  She notes that she was seen, evaluated, diagnosed with strep about 1 week ago, took azithromycin, but soon thereafter felt pain in the right jaw where she has a known broken tooth.  She saw urgent care, was started on amoxicillin, stopped azithromycin, and is scheduled to see oral surgery on Tuesday, in 4 days. However, the patient has had increasing pain in this area without fever, chills, nausea, vomiting, dyspnea.    Home Medications Prior to Admission medications   Medication Sig Start Date End Date Taking? Authorizing Provider  chlorhexidine (PERIDEX) 0.12 % solution Use as directed 15 mLs in the mouth or throat 2 (two) times daily. 04/06/22  Yes Carmin Muskrat, MD  HYDROcodone-acetaminophen (NORCO/VICODIN) 5-325 MG tablet Take 1 tablet by mouth every 6 (six) hours as needed for severe pain. 04/06/22  Yes Carmin Muskrat, MD  amphetamine-dextroamphetamine (ADDERALL) 20 MG tablet Take 20 mg by mouth daily.     [provider]  benzonatate (TESSALON) 200 MG capsule Take 1 capsule (200 mg total) by mouth 3 (three) times daily as needed for cough. Swallow whole, do not chew Patient not taking: Reported on 10/05/2020 08/19/20   Kem Parkinson, PA-C  buPROPion [facility] SR) 100 MG 12 hr tablet Wellbutrin SR 100 mg tablet, 12 hr sustained-release  Take 1 tablet every day by oral route. Patient not taking: No sig reported    [provider]  dicyclomine (BENTYL) 10 MG capsule Take 10 mg by mouth 4 (four) times daily -  before meals and at bedtime. Patient not taking: Reported on 04/18/2020    [provider]  esomeprazole (NEXIUM) 40 MG packet Take 40 mg by mouth daily. 03/26/22   [provider]  famotidine (PEPCID) 20 MG tablet Take 1 tablet (20 mg total) by mouth 2 (two) times daily. Patient not taking: Reported on 04/18/2020 12/16/18   Fredia Sorrow, MD  Fremanezumab-vfrm (AJOVY) 225 MG/1.5ML SOSY Inject 225 mg into the skin every 28 (twenty-eight) days. Patient not taking: Reported on 04/02/2022 10/31/20   Pieter Partridge, DO  ibuprofen (ADVIL) 800 MG tablet Take 1 tablet (800 mg total) by mouth 3 (three) times daily. Patient not taking: Reported on 10/05/2020 08/19/20   Triplett, Tammy, PA-C  sucralfate (CARAFATE) 1 GM/10ML suspension Take 10 mLs (1 g total) by mouth 4 (four) times daily -  with meals and at bedtime. Patient not taking: Reported on 04/02/2022 06/26/21   Davonna Belling, MD  Ubrogepant (UBRELVY) 100 MG TABS Take 1 tablet by mouth as needed (May repeat in 2 hours. Maximum 2 tablets in 24 hours). Patient not taking: Reported on 04/02/2022 12/20/20   Pieter Partridge, DO      Allergies    Ciprofloxacin, Dilaudid [hydromorphone hcl], Morphine, Morphine sulfate, Other, Adhesive [tape], Amoxicillin, and Morphine and related    Review of Systems   Review of Systems  All other systems reviewed and are negative.   Physical Exam Updated Vital Signs BP (!) 142/105 (BP Location: Right Arm)   Pulse 98   Temp (!) 97.5 F (36.4 C) (Oral)   Resp 20   Ht 5\' 7"  (1.702 m)   Wt 90.3 kg   LMP 08/18/2012  SpO2 99%   BMI 31.17 kg/m  Physical Exam Vitals and nursing note reviewed.  Constitutional:      General: She is not in acute distress.    Appearance: She is well-developed.  HENT:     Head: Normocephalic and atraumatic.     Mouth/Throat:   Eyes:     Conjunctiva/sclera: Conjunctivae normal.  Pulmonary:     Effort: Pulmonary effort is normal. No respiratory distress.     Breath sounds: No stridor.  Abdominal:     General: There is no distension.  Skin:    General: Skin is warm and dry.  Neurological:     Mental Status: She is alert and oriented to person, place,  and time.     Cranial Nerves: No cranial nerve deficit.  Psychiatric:        Mood and Affect: Mood normal.     ED Results / Procedures / Treatments   Labs (all labs ordered are listed, but only abnormal results are displayed) Labs Reviewed - No data to display  EKG None  Radiology No results found.  Procedures Procedures    Medications Ordered in ED Medications  ketorolac (TORADOL) 15 MG/ML injection 15 mg (has no administration in time range)  fentaNYL (SUBLIMAZE) injection 50 mcg (has no administration in time range)    ED Course/ Medical Decision Making/ A&P                           Medical Decision Making Well-appearing adult female with known broken tooth presents with jaw pain.  Patient is awake, alert, afebrile, has no oropharyngeal abnormalities, no external asymmetry suggesting deep space infection or impending respiratory compromise, no vital sign abnormalities consistent with bacteremia or sepsis.  Patient just started new antibiotic, will continue this as well as topical antiseptic.  Patient started on analgesics, anti-inflammatories, IM here, oral at home, will follow-up with dental surgery.  Risk Prescription drug management. Parenteral controlled substances.           Final Clinical Impression(s) / ED Diagnoses Final diagnoses:  Pain, dental    Rx / DC Orders ED Discharge Orders          Ordered    HYDROcodone-acetaminophen (NORCO/VICODIN) 5-325 MG tablet  Every 6 hours PRN        04/06/22 2020    chlorhexidine (PERIDEX) 0.12 % solution  2 times daily        04/06/22 2020              Carmin Muskrat, MD 04/06/22 2024

## 2022-04-06 NOTE — ED Triage Notes (Signed)
Pt c/o right upper dental pain. Seen at [facility] last night and was told she has an abscess. Nad. Currently taking antibiotcs, z pack was stopped and was given amoxicillin for the strep throat.

## 2022-05-06 DIAGNOSIS — Z6831 Body mass index (BMI) 31.0-31.9, adult: Secondary | ICD-10-CM | POA: Diagnosis not present

## 2022-05-06 DIAGNOSIS — Z01419 Encounter for gynecological examination (general) (routine) without abnormal findings: Secondary | ICD-10-CM | POA: Diagnosis not present

## 2022-05-06 DIAGNOSIS — Z1272 Encounter for screening for malignant neoplasm of vagina: Secondary | ICD-10-CM | POA: Diagnosis not present

## 2022-05-06 DIAGNOSIS — R319 Hematuria, unspecified: Secondary | ICD-10-CM | POA: Diagnosis not present

## 2022-05-09 ENCOUNTER — Other Ambulatory Visit: Payer: Self-pay | Admitting: Obstetrics and Gynecology

## 2022-05-09 DIAGNOSIS — N644 Mastodynia: Secondary | ICD-10-CM

## 2022-05-28 ENCOUNTER — Ambulatory Visit
Admission: RE | Admit: 2022-05-28 | Discharge: 2022-05-28 | Disposition: A | Discharge status: Home / Self Care | Payer: BC Managed Care – PPO | Source: Ambulatory Visit | Attending: Obstetrics and Gynecology | Admitting: Obstetrics and Gynecology

## 2022-05-28 DIAGNOSIS — N644 Mastodynia: Secondary | ICD-10-CM

## 2022-06-05 ENCOUNTER — Other Ambulatory Visit: Payer: Self-pay | Admitting: Surgery

## 2022-06-05 DIAGNOSIS — N644 Mastodynia: Secondary | ICD-10-CM | POA: Diagnosis not present

## 2022-07-26 ENCOUNTER — Telehealth: Payer: BC Managed Care – PPO | Admitting: Physician Assistant

## 2022-07-26 DIAGNOSIS — N760 Acute vaginitis: Secondary | ICD-10-CM

## 2022-07-26 DIAGNOSIS — B9689 Other specified bacterial agents as the cause of diseases classified elsewhere: Secondary | ICD-10-CM | POA: Diagnosis not present

## 2022-07-26 MED ORDER — TINIDAZOLE 500 MG PO TABS: 2000.0000 mg | ORAL_TABLET | Freq: Every day | ORAL | 0 refills | Status: DC

## 2022-07-26 NOTE — Patient Instructions (Signed)
[name], thank you for joining Monica Loveless, PA-C for today's virtual visit.  While this provider is not your primary care provider (PCP), if your PCP is located in our provider database this encounter information will be shared with them immediately following your visit.   A [place] MyChart account gives you access to today's visit and all your visits, tests, and labs performed at [facility] " click here if you don't have a [facility] MyChart account or go to [email or web address]  Consent: (Patient) [name] provided verbal consent for this virtual visit at the beginning of the encounter.  Current Medications:  Current Outpatient Medications:    tinidazole (TINDAMAX) 500 MG tablet, Take 4 tablets (2,000 mg total) by mouth daily with breakfast., Disp: 4 tablet, Rfl: 0   amphetamine-dextroamphetamine (ADDERALL) 20 MG tablet, Take 20 mg by mouth daily. , Disp: , Rfl:    benzonatate (TESSALON) 200 MG capsule, Take 1 capsule (200 mg total) by mouth 3 (three) times daily as needed for cough. Swallow whole, do not chew (Patient not taking: Reported on 10/05/2020), Disp: 21 capsule, Rfl: 0   buPROPion (WELLBUTRIN SR) 100 MG 12 hr tablet, Wellbutrin SR 100 mg tablet, 12 hr sustained-release  Take 1 tablet every day by oral route. (Patient not taking: No sig reported), Disp: , Rfl:    chlorhexidine (PERIDEX) 0.12 % solution, Use as directed 15 mLs in the mouth or throat 2 (two) times daily., Disp: 120 mL, Rfl: 0   dicyclomine (BENTYL) 10 MG capsule, Take 10 mg by mouth 4 (four) times daily -  before meals and at bedtime. (Patient not taking: Reported on 04/18/2020), Disp: , Rfl:    esomeprazole (NEXIUM) 40 MG packet, Take 40 mg by mouth daily., Disp: , Rfl:    famotidine (PEPCID) 20 MG tablet, Take 1 tablet (20 mg total) by mouth 2 (two) times daily. (Patient not taking: Reported on 04/18/2020), Disp: 30 tablet, Rfl: 0   Fremanezumab-vfrm (AJOVY) 225 MG/1.5ML  SOSY, Inject 225 mg into the skin every 28 (twenty-eight) days. (Patient not taking: Reported on 04/02/2022), Disp: 1.68 mL, Rfl: 5   HYDROcodone-acetaminophen (NORCO/VICODIN) 5-325 MG tablet, Take 1 tablet by mouth every 6 (six) hours as needed for severe pain., Disp: 12 tablet, Rfl: 0   ibuprofen (ADVIL) 800 MG tablet, Take 1 tablet (800 mg total) by mouth 3 (three) times daily. (Patient not taking: Reported on 10/05/2020), Disp: 21 tablet, Rfl: 0   sucralfate (CARAFATE) 1 GM/10ML suspension, Take 10 mLs (1 g total) by mouth 4 (four) times daily -  with meals and at bedtime. (Patient not taking: Reported on 04/02/2022), Disp: 420 mL, Rfl: 0   Ubrogepant (UBRELVY) 100 MG TABS, Take 1 tablet by mouth as needed (May repeat in 2 hours. Maximum 2 tablets in 24 hours). (Patient not taking: Reported on 04/02/2022), Disp: 16 tablet, Rfl: 5   Medications ordered in this encounter:  Meds ordered this encounter  Medications   tinidazole (TINDAMAX) 500 MG tablet    Sig: Take 4 tablets (2,000 mg total) by mouth daily with breakfast.    Dispense:  4 tablet    Refill:  0    Order Specific Question:   Supervising Provider    Answer:   Merrilee Jansky [id number]     *If you need refills on other medications prior to your next appointment, please contact your pharmacy*  Follow-Up: Call back or seek an in-person evaluation if the symptoms worsen or if the  condition fails to improve as anticipated.  [place] [phone]  Other Instructions  Bacterial Vaginosis  Bacterial vaginosis is an infection that occurs when the normal balance of bacteria in the vagina changes. This change is caused by an overgrowth of certain bacteria in the vagina. Bacterial vaginosis is the most common vaginal infection among females aged 40 to 46 years. This condition increases the risk of sexually transmitted infections (STIs). Treatment can help reduce this risk. Treatment is very important for pregnant women  because this condition can cause babies to be born early (prematurely) or at a low birth weight. What are the causes? This condition is caused by an increase in harmful bacteria that are normally present in small amounts in the vagina. However, the exact reason this condition develops is not known. You cannot get bacterial vaginosis from toilet seats, bedding, swimming pools, or contact with objects around you. What increases the risk? The following factors may make you more likely to develop this condition: Having a new sexual partner or multiple sexual partners, or having unprotected sex. Douching. Having an intrauterine device (IUD). Smoking. Abusing drugs and alcohol. This may lead to riskier sexual behavior. Taking certain antibiotic medicines. Being pregnant. What are the signs or symptoms? Some women with this condition have no symptoms. Symptoms may include: Pear[place] Cables or white vaginal discharge. The discharge can be watery or foamy. A fish-like odor with discharge, especially after sex or during menstruation. Itching in and around the vagina. Burning or pain with urination. How is this diagnosed? This condition is diagnosed based on: Your medical history. A physical exam of the vagina. Checking a sample of vaginal fluid for harmful bacteria or abnormal cells. How is this treated? This condition is treated with antibiotic medicines. These may be given as a pill, a vaginal cream, or a medicine that is put into the vagina (suppository). If the condition comes back after treatment, a second round of antibiotics may be needed. Follow these instructions at home: Medicines Take or apply over-the-counter and prescription medicines only as told by your health care provider. Take or apply your antibiotic medicine as told by your health care provider. Do not stop using the antibiotic even if you start to feel better. General instructions If you have a female sexual partner, tell her that you  have a vaginal infection. She should follow up with her health care provider. If you have a female sexual partner, he does not need treatment. Avoid sexual activity until you finish treatment. Drink enough fluid to keep your urine pale yellow. Keep the area around your vagina and rectum clean. Wash the area daily with warm water. Wipe yourself from front to back after using the toilet. If you are breastfeeding, talk to your health care provider about continuing breastfeeding during treatment. Keep all follow-up visits. This is important. How is this prevented? Self-care Do not douche. Wash the outside of your vagina with warm water only. Wear cotton or cotton-lined underwear. Avoid wearing tight pants and pantyhose, especially during the summer. Safe sex Use protection when having sex. This includes: Using condoms. Using dental dams. This is a thin layer of a material made of latex or polyurethane that protects the mouth during oral sex. Limit the number of sexual partners. To help prevent bacterial vaginosis, it is best to have sex with just one partner (monogamous relationship). Make sure you and your sexual partner are tested for STIs. Drugs and alcohol Do not use any products that contain nicotine  or tobacco. These products include cigarettes, chewing tobacco, and vaping devices, such as e-cigarettes. If you need help quitting, ask your health care provider. Do not use drugs. Do not drink alcohol if: Your health care provider tells you not to do this. You are pregnant, may be pregnant, or are planning to become pregnant. If you drink alcohol: Limit how much you have to 0-1 drink a day. Be aware of how much alcohol is in your drink. In the [place] one drink equals one 12 oz bottle of beer (355 mL), one 5 oz glass of wine (148 mL), or one 1 oz glass of hard liquor (44 mL). Where to find more information Centers for Disease Control and Prevention: http://www.wolf.info/ American Sexual Health  Association (ASHA): www.ashastd.org U.S. Department of Health and Financial controller, Office on Women's Health: [place].tn Contact a health care provider if: Your symptoms do not improve, even after treatment. You have more discharge or pain when urinating. You have a fever or chills. You have pain in your abdomen or pelvis. You have pain during sex. You have vaginal bleeding between menstrual periods. Summary Bacterial vaginosis is a vaginal infection that occurs when the normal balance of bacteria in the vagina changes. It results from an overgrowth of certain bacteria. This condition increases the risk of sexually transmitted infections (STIs). Getting treated can help reduce this risk. Treatment is very important for pregnant women because this condition can cause babies to be born early (prematurely) or at low birth weight. This condition is treated with antibiotic medicines. These may be given as a pill, a vaginal cream, or a medicine that is put into the vagina (suppository). This information is not intended to replace advice given to you by your health care provider. Make sure you discuss any questions you have with your health care provider. Document Revised: 12/16/2019 Document Reviewed: 12/16/2019 Elsevier Patient Education  [place].    If you have been instructed to have an in-person evaluation today at a local Urgent Care facility, please use the link below. It will take you to a list of all of our available [facility], including address, phone number and hours of operation. Please do not delay care.  [facility]  If you or a family member do not have a primary care provider, use the link below to schedule a visit and establish care. When you choose a [facility] primary care physician or advanced practice provider, you gain a long-term partner in health. Find a Primary Care Provider  Learn more about 's in-office and  virtual care options: [facility]

## 2022-07-26 NOTE — Progress Notes (Signed)
Virtual Visit Consent   NGOC DETJEN, you are scheduled for a virtual visit with a [facility] provider today. Just as with appointments in the office, your consent must be obtained to participate. Your consent will be active for this visit and any virtual visit you may have with one of our providers in the next 365 days. If you have a MyChart account, a copy of this consent can be sent to you electronically.  As this is a virtual visit, video technology does not allow for your provider to perform a traditional examination. This may limit your provider's ability to fully assess your condition. If your provider identifies any concerns that need to be evaluated in person or the need to arrange testing (such as labs, EKG, etc.), we will make arrangements to do so. Although advances in technology are sophisticated, we cannot ensure that it will always work on either your end or our end. If the connection with a video visit is poor, the visit may have to be switched to a telephone visit. With either a video or telephone visit, we are not always able to ensure that we have a secure connection.  By engaging in this virtual visit, you consent to the provision of healthcare and authorize for your insurance to be billed (if applicable) for the services provided during this visit. Depending on your insurance coverage, you may receive a charge related to this service.  I need to obtain your verbal consent now. Are you willing to proceed with your visit today? EVELENA MASCI has provided verbal consent on 07/26/2022 for a virtual visit (video or telephone). Mar Daring, PA-C  Date: 07/26/2022 2:15 PM  Virtual Visit via Video Note   I, Mar Daring, connected with  [name]  ([id number], [birth year]) on 07/26/22 at  2:15 PM EST by a video-enabled telemedicine application and verified that I am speaking with the correct person using two identifiers.  Location: Patient: Virtual Visit  Location Patient: Home Provider: Virtual Visit Location Provider: Home Office   I discussed the limitations of evaluation and management by telemedicine and the availability of in person appointments. The patient expressed understanding and agreed to proceed.    History of Present Illness: [name] is a 37 y.o. who identifies as a female who was assigned female at birth, and is being seen today for vaginal discharge.  HPI: Vaginal Discharge The patient's primary symptoms include a genital odor and vaginal discharge. The patient's pertinent negatives include no genital itching. This is a recurrent problem. The current episode started yesterday. The problem occurs constantly. The problem has been gradually worsening. The patient is experiencing no pain. Pertinent negatives include no back pain, chills, fever, flank pain, frequency, nausea or painful intercourse. The vaginal discharge was milky, white and malodorous. She has not been passing clots. She has not been passing tissue. She has tried nothing for the symptoms.     Problems:  Patient Active Problem List   Diagnosis Date Noted   Intractable chronic migraine without aura and with status migrainosus 03/24/2017   Acute appendicitis    Chiari I malformation ([place]) 08/08/2016   Endometriosis 09/16/2012   CERVICALGIA 09/08/2007   CERVICAL SPASM 09/08/2007    Allergies:  Allergies  Allergen Reactions   Ciprofloxacin Shortness Of Breath   Dilaudid [Hydromorphone Hcl] Shortness Of Breath   Morphine    Morphine Sulfate Other (See Comments)   Other Other (See Comments)   Adhesive [Tape] Rash  Amoxicillin Rash   Morphine And Related Itching and Nausea And Vomiting    Have to give benadryl with morphine, also nausea/vomiting   Medications:  Current Outpatient Medications:    tinidazole (TINDAMAX) 500 MG tablet, Take 4 tablets (2,000 mg total) by mouth daily with breakfast., Disp: 4 tablet, Rfl: 0   amphetamine-dextroamphetamine  (ADDERALL) 20 MG tablet, Take 20 mg by mouth daily. , Disp: , Rfl:    benzonatate (TESSALON) 200 MG capsule, Take 1 capsule (200 mg total) by mouth 3 (three) times daily as needed for cough. Swallow whole, do not chew (Patient not taking: Reported on 10/05/2020), Disp: 21 capsule, Rfl: 0   buPROPion (WELLBUTRIN SR) 100 MG 12 hr tablet, Wellbutrin SR 100 mg tablet, 12 hr sustained-release  Take 1 tablet every day by oral route. (Patient not taking: No sig reported), Disp: , Rfl:    chlorhexidine (PERIDEX) 0.12 % solution, Use as directed 15 mLs in the mouth or throat 2 (two) times daily., Disp: 120 mL, Rfl: 0   dicyclomine (BENTYL) 10 MG capsule, Take 10 mg by mouth 4 (four) times daily -  before meals and at bedtime. (Patient not taking: Reported on 04/18/2020), Disp: , Rfl:    esomeprazole (NEXIUM) 40 MG packet, Take 40 mg by mouth daily., Disp: , Rfl:    famotidine (PEPCID) 20 MG tablet, Take 1 tablet (20 mg total) by mouth 2 (two) times daily. (Patient not taking: Reported on 04/18/2020), Disp: 30 tablet, Rfl: 0   Fremanezumab-vfrm (AJOVY) 225 MG/1.5ML SOSY, Inject 225 mg into the skin every 28 (twenty-eight) days. (Patient not taking: Reported on 04/02/2022), Disp: 1.68 mL, Rfl: 5   HYDROcodone-acetaminophen (NORCO/VICODIN) 5-325 MG tablet, Take 1 tablet by mouth every 6 (six) hours as needed for severe pain., Disp: 12 tablet, Rfl: 0   ibuprofen (ADVIL) 800 MG tablet, Take 1 tablet (800 mg total) by mouth 3 (three) times daily. (Patient not taking: Reported on 10/05/2020), Disp: 21 tablet, Rfl: 0   sucralfate (CARAFATE) 1 GM/10ML suspension, Take 10 mLs (1 g total) by mouth 4 (four) times daily -  with meals and at bedtime. (Patient not taking: Reported on 04/02/2022), Disp: 420 mL, Rfl: 0   Ubrogepant (UBRELVY) 100 MG TABS, Take 1 tablet by mouth as needed (May repeat in 2 hours. Maximum 2 tablets in 24 hours). (Patient not taking: Reported on 04/02/2022), Disp: 16 tablet, Rfl:  5  Observations/Objective: Patient is well-developed, well-nourished in no acute distress.  Resting comfortably at home.  Head is normocephalic, atraumatic.  No labored breathing.  Speech is clear and coherent with logical content.  Patient is alert and oriented at baseline.    Assessment and Plan: 1. BV (bacterial vaginosis) - tinidazole (TINDAMAX) 500 MG tablet; Take 4 tablets (2,000 mg total) by mouth daily with breakfast.  Dispense: 4 tablet; Refill: 0  - Symptoms consistent with BV - Tinidazole prescribed (does better with one day treatment due to h/o esophagitis with longer treatment durations) - Limit bubble baths, scented lotions/soaps/detergents - Limit tight fitting clothing - Seek on person evaluation if not improving or if symptoms worsen   Follow Up Instructions: I discussed the assessment and treatment plan with the patient. The patient was provided an opportunity to ask questions and all were answered. The patient agreed with the plan and demonstrated an understanding of the instructions.  A copy of instructions were sent to the patient via MyChart unless otherwise noted below.    The patient was advised to call back  or seek an in-person evaluation if the symptoms worsen or if the condition fails to improve as anticipated.  Time:  I spent 11 minutes with the patient via telehealth technology discussing the above problems/concerns.    [name], PA-C

## 2022-08-18 ENCOUNTER — Other Ambulatory Visit: Payer: Self-pay

## 2022-08-18 ENCOUNTER — Emergency Department (HOSPITAL_COMMUNITY): Payer: BC Managed Care – PPO

## 2022-08-18 ENCOUNTER — Emergency Department (HOSPITAL_COMMUNITY)
Admission: EM | Admit: 2022-08-18 | Discharge: 2022-08-18 | Disposition: A | Discharge status: Home / Self Care | Payer: BC Managed Care – PPO | Attending: Emergency Medicine | Admitting: Emergency Medicine

## 2022-08-18 ENCOUNTER — Encounter (HOSPITAL_COMMUNITY): Payer: Self-pay | Admitting: *Deleted

## 2022-08-18 DIAGNOSIS — Z1152 Encounter for screening for COVID-19: Secondary | ICD-10-CM | POA: Insufficient documentation

## 2022-08-18 DIAGNOSIS — J40 Bronchitis, not specified as acute or chronic: Secondary | ICD-10-CM | POA: Insufficient documentation

## 2022-08-18 DIAGNOSIS — F17210 Nicotine dependence, cigarettes, uncomplicated: Secondary | ICD-10-CM | POA: Diagnosis not present

## 2022-08-18 DIAGNOSIS — J209 Acute bronchitis, unspecified: Secondary | ICD-10-CM

## 2022-08-18 DIAGNOSIS — R059 Cough, unspecified: Secondary | ICD-10-CM | POA: Diagnosis not present

## 2022-08-18 LAB — RESP PANEL BY RT-PCR (RSV, FLU A&B, COVID)  RVPGX2
Influenza A by PCR: NEGATIVE
Influenza B by PCR: NEGATIVE
Resp Syncytial Virus by PCR: NEGATIVE
SARS Coronavirus 2 by RT PCR: NEGATIVE

## 2022-08-18 MED ORDER — ALBUTEROL SULFATE HFA 108 (90 BASE) MCG/ACT IN AERS
2.0000 | INHALATION_SPRAY | Freq: Four times a day (QID) | RESPIRATORY_TRACT | Status: DC
  Filled 2022-08-18: qty 6.7

## 2022-08-18 MED ORDER — PREDNISONE 20 MG PO TABS: 40.0000 mg | ORAL_TABLET | Freq: Every day | ORAL | 0 refills | Status: DC

## 2022-08-18 MED ORDER — PREDNISONE 50 MG PO TABS
60.0000 mg | ORAL_TABLET | ORAL | Status: AC
  Administered 2022-08-18: 60 mg via ORAL
  Filled 2022-08-18: qty 1

## 2022-08-18 MED ORDER — GUAIFENESIN 100 MG/5ML PO LIQD
5.0000 mL | Freq: Once | ORAL | Status: AC
  Administered 2022-08-18: 5 mL via ORAL
  Filled 2022-08-18: qty 5

## 2022-08-18 NOTE — Discharge Instructions (Addendum)
As discussed, with bronchitis the single most important therapeutic maneuver is to stop smoking.  Please continue your efforts.  Please obtain and take the prescribed steroids.  You have been provided an albuterol inhaler.  Please use this every 4 hours for the next 2 days, then as needed for additional relief.

## 2022-08-18 NOTE — ED Triage Notes (Signed)
Pt c/o generalized body aches with cough x 4 days

## 2022-08-18 NOTE — ED Provider Notes (Signed)
[facility] Provider Note   CSN: [id number] Arrival date & time: 08/18/22  [id number]     History  Chief Complaint  Patient presents with   Generalized Body Aches    [name] is a 37 y.o. female.  HPI Patient presents with concern of cough, congestion, generalized discomfort.  Onset was 4 days ago.  Since onset no relief with Tylenol and OTC cough medication. Patient is a current cigarette smoker.  I saw and evaluated the patient a few months ago after she presented for dental pain.  She notes that since that time she has had 1 tooth removed, and feels generally better in terms of dental pain, though she has other affected teeth, and currently also has soreness, as well as sore throat.    Home Medications Prior to Admission medications   Medication Sig Start Date End Date Taking? Authorizing Provider  predniSONE (DELTASONE) 20 MG tablet Take 2 tablets (40 mg total) by mouth daily with breakfast. For the next four days 08/18/22  Yes Carmin Muskrat, MD  amphetamine-dextroamphetamine (ADDERALL) 20 MG tablet Take 20 mg by mouth daily.     [provider]  benzonatate (TESSALON) 200 MG capsule Take 1 capsule (200 mg total) by mouth 3 (three) times daily as needed for cough. Swallow whole, do not chew Patient not taking: Reported on 10/05/2020 08/19/20   Kem Parkinson, PA-C  buPROPion [facility] SR) 100 MG 12 hr tablet Wellbutrin SR 100 mg tablet, 12 hr sustained-release  Take 1 tablet every day by oral route. Patient not taking: No sig reported    [provider]  chlorhexidine (PERIDEX) 0.12 % solution Use as directed 15 mLs in the mouth or throat 2 (two) times daily. 04/06/22   Carmin Muskrat, MD  dicyclomine (BENTYL) 10 MG capsule Take 10 mg by mouth 4 (four) times daily -  before meals and at bedtime. Patient not taking: Reported on 04/18/2020    [provider]  esomeprazole (NEXIUM) 40 MG packet Take 40  mg by mouth daily. 03/26/22   [provider]  famotidine (PEPCID) 20 MG tablet Take 1 tablet (20 mg total) by mouth 2 (two) times daily. Patient not taking: Reported on 04/18/2020 12/16/18   Fredia Sorrow, MD  Fremanezumab-vfrm (AJOVY) 225 MG/1.5ML SOSY Inject 225 mg into the skin every 28 (twenty-eight) days. Patient not taking: Reported on 04/02/2022 10/31/20   Pieter Partridge, DO  HYDROcodone-acetaminophen (NORCO/VICODIN) 5-325 MG tablet Take 1 tablet by mouth every 6 (six) hours as needed for severe pain. 04/06/22   Carmin Muskrat, MD  ibuprofen (ADVIL) 800 MG tablet Take 1 tablet (800 mg total) by mouth 3 (three) times daily. Patient not taking: Reported on 10/05/2020 08/19/20   Triplett, Tammy, PA-C  sucralfate (CARAFATE) 1 GM/10ML suspension Take 10 mLs (1 g total) by mouth 4 (four) times daily -  with meals and at bedtime. Patient not taking: Reported on 04/02/2022 06/26/21   Davonna Belling, MD  tinidazole [facility]) 500 MG tablet Take 4 tablets (2,000 mg total) by mouth daily with breakfast. 07/26/22   Mar Daring, PA-C  Ubrogepant (UBRELVY) 100 MG TABS Take 1 tablet by mouth as needed (May repeat in 2 hours. Maximum 2 tablets in 24 hours). Patient not taking: Reported on 04/02/2022 12/20/20   Pieter Partridge, DO      Allergies    Ciprofloxacin, Dilaudid [hydromorphone hcl], Morphine, Morphine sulfate, Other, Adhesive [tape], Amoxicillin, and Morphine and related  Review of Systems   Review of Systems  All other systems reviewed and are negative.   Physical Exam Updated Vital Signs BP 128/83 (BP Location: Left Arm)   Pulse 94   Temp 98.1 F (36.7 C) (Oral)   Resp 20   Ht 5' 7"$  (1.702 m)   Wt 88.5 kg   LMP 08/18/2012   SpO2 99%   BMI 30.54 kg/m  Physical Exam Vitals and nursing note reviewed.  Constitutional:      General: She is not in acute distress.    Appearance: She is well-developed.  HENT:     Head: Normocephalic and atraumatic.     Mouth/Throat:      Mouth: Mucous membranes are moist.     Pharynx: No oropharyngeal exudate or posterior oropharyngeal erythema.  Eyes:     Conjunctiva/sclera: Conjunctivae normal.  Cardiovascular:     Rate and Rhythm: Normal rate and regular rhythm.  Pulmonary:     Effort: Pulmonary effort is normal. No respiratory distress.     Breath sounds: Normal breath sounds. No stridor.  Abdominal:     General: There is no distension.  Skin:    General: Skin is warm and dry.  Neurological:     Mental Status: She is alert and oriented to person, place, and time.     Cranial Nerves: No cranial nerve deficit.  Psychiatric:        Mood and Affect: Mood normal.     ED Results / Procedures / Treatments   Labs (all labs ordered are listed, but only abnormal results are displayed) Labs Reviewed  RESP PANEL BY RT-PCR (RSV, FLU A&B, COVID)  [id number]    EKG None  Radiology DG Chest 2 View  Result Date: 08/18/2022 CLINICAL DATA:  Rule out pneumonia.  Smoker with cough EXAM: CHEST - 2 VIEW COMPARISON:  08/20/2021 FINDINGS: Normal heart size and mediastinal contours. No acute infiltrate or edema. No effusion or pneumothorax. No acute osseous findings. IMPRESSION: Negative chest. Electronically Signed   By: Jorje Guild [name]   On: 08/18/2022 08:51    Procedures Procedures    Medications Ordered in ED Medications  predniSONE (DELTASONE) tablet 60 mg (has no administration in time range)  albuterol (VENTOLIN HFA) 108 (90 Base) MCG/ACT inhaler 2 puff (has no administration in time range)  guaiFENesin (ROBITUSSIN) 100 MG/5ML liquid 5 mL (5 mLs Oral Given 08/18/22 0910)    ED Course/ Medical Decision Making/ A&P                             Medical Decision Making Current smoker presents with cough, congestion for 4 days.  Differential including pneumonia, bronchitis, viral syndrome.  Less likely bacteremia, sepsis, given initial reassuring vital signs.  X-ray, labs ordered.  Amount and/or Complexity of  Data Reviewed External Data Reviewed: notes.    Details: Eval reviewed last year. Labs: ordered. Decision-making details documented in ED Course. Radiology: ordered and independent interpretation performed. Decision-making details documented in ED Course.  Risk OTC drugs. Prescription drug management. Decision regarding hospitalization.   9:50 AM Patient in no distress, awake, alert, no hypoxia, no hypotension, no fever.  I reviewed her x-ray, labs, no pneumonia, some suspicion for bronchitis given her cigarette addiction. Absent evidence for the above, pneumonia, bacteremia, sepsis, patient discharged with bronchodilators, steroids.        Final Clinical Impression(s) / ED Diagnoses Final diagnoses:  Acute bronchitis, unspecified organism    Rx /  DC Orders ED Discharge Orders          Ordered    predniSONE (DELTASONE) 20 MG tablet  Daily with breakfast        08/18/22 0950              Carmin Muskrat, MD 08/18/22 [phone]

## 2022-08-18 NOTE — ED Notes (Signed)
Pt discharged with all questions answered. Pt received loading dose of steroid and received education on prescription medication. Pt refused dose of albuterol and expressed irritation that she was given albuterol due to daily use of adderall. Pt accepted inhaler to take home and was given education on inhaler dosage and how to use.

## 2022-08-26 DIAGNOSIS — F17210 Nicotine dependence, cigarettes, uncomplicated: Secondary | ICD-10-CM | POA: Diagnosis not present

## 2022-08-26 DIAGNOSIS — R059 Cough, unspecified: Secondary | ICD-10-CM | POA: Diagnosis not present

## 2022-08-26 DIAGNOSIS — F900 Attention-deficit hyperactivity disorder, predominantly inattentive type: Secondary | ICD-10-CM | POA: Diagnosis not present

## 2022-08-27 ENCOUNTER — Telehealth: Payer: BC Managed Care – PPO | Admitting: Nurse Practitioner

## 2022-08-27 DIAGNOSIS — N761 Subacute and chronic vaginitis: Secondary | ICD-10-CM

## 2022-08-27 MED ORDER — METRONIDAZOLE 0.75 % VA GEL: 1.0000 | Freq: Every day | VAGINAL | 0 refills | Status: AC

## 2022-08-27 NOTE — Progress Notes (Signed)
Virtual Visit Consent   [name], you are scheduled for a virtual visit with a [facility] provider today. Just as with appointments in the office, your consent must be obtained to participate. Your consent will be active for this visit and any virtual visit you may have with one of our providers in the next 365 days. If you have a MyChart account, a copy of this consent can be sent to you electronically.  As this is a virtual visit, video technology does not allow for your provider to perform a traditional examination. This may limit your provider's ability to fully assess your condition. If your provider identifies any concerns that need to be evaluated in person or the need to arrange testing (such as labs, EKG, etc.), we will make arrangements to do so. Although advances in technology are sophisticated, we cannot ensure that it will always work on either your end or our end. If the connection with a video visit is poor, the visit may have to be switched to a telephone visit. With either a video or telephone visit, we are not always able to ensure that we have a secure connection.  By engaging in this virtual visit, you consent to the provision of healthcare and authorize for your insurance to be billed (if applicable) for the services provided during this visit. Depending on your insurance coverage, you may receive a charge related to this service.  I need to obtain your verbal consent now. Are you willing to proceed with your visit today? AMELDA CUTAIA has provided verbal consent on 08/27/2022 for a virtual visit (video or telephone). Apolonio Schneiders, FNP  Date: 08/27/2022 2:13 PM  Virtual Visit via Video Note   I, Apolonio Schneiders, connected with  [name]  ([id number], May 31, 1986) on 08/27/22 at  2:15 PM EST by a video-enabled telemedicine application and verified that I am speaking with the correct person using two identifiers.  Location: Patient: Virtual Visit Location Patient:  Home Provider: Virtual Visit Location Provider: Home Office   I discussed the limitations of evaluation and management by telemedicine and the availability of in person appointments. The patient expressed understanding and agreed to proceed.    History of Present Illness: [name] is a 37 y.o. who identifies as a female who was assigned female at birth, and is being seen today for vaginal discharge.  She has been on antibiotics several times in the past few months  Was treated for a dental infection and currently bronchitis   She was treated 07/26/22 for BV  She felt like she was having vaginal discharge without seeing anything  Denies burning, itching or pain  She does note an odor vaginally   She uses Boric Acid suppositories as a preventative to recurrent vaginitis   She does not regularly get yeast infections.   She is currently on doxycyline   She has had a child he is 15  She has had a partial hysterectomy in 2014  Started noting these recurrent symptoms when she started Adderall   She has not been sexually active in the past 5-6 months   Problems:  Patient Active Problem List   Diagnosis Date Noted   Intractable chronic migraine without aura and with status migrainosus 03/24/2017   Acute appendicitis    Chiari I malformation ([place]) 08/08/2016   Endometriosis 09/16/2012   CERVICALGIA 09/08/2007   CERVICAL SPASM 09/08/2007    Allergies:  Allergies  Allergen Reactions   Ciprofloxacin Shortness Of Breath  Dilaudid [Hydromorphone Hcl] Shortness Of Breath   Morphine    Morphine Sulfate Other (See Comments)   Other Other (See Comments)   Adhesive [Tape] Rash   Amoxicillin Rash   Morphine And Related Itching and Nausea And Vomiting    Have to give benadryl with morphine, also nausea/vomiting   Medications:  Current Outpatient Medications:    amphetamine-dextroamphetamine (ADDERALL) 20 MG tablet, Take 20 mg by mouth daily. , Disp: , Rfl:    benzonatate  (TESSALON) 200 MG capsule, Take 1 capsule (200 mg total) by mouth 3 (three) times daily as needed for cough. Swallow whole, do not chew (Patient not taking: Reported on 10/05/2020), Disp: 21 capsule, Rfl: 0   buPROPion (WELLBUTRIN SR) 100 MG 12 hr tablet, Wellbutrin SR 100 mg tablet, 12 hr sustained-release  Take 1 tablet every day by oral route. (Patient not taking: No sig reported), Disp: , Rfl:    chlorhexidine (PERIDEX) 0.12 % solution, Use as directed 15 mLs in the mouth or throat 2 (two) times daily., Disp: 120 mL, Rfl: 0   dicyclomine (BENTYL) 10 MG capsule, Take 10 mg by mouth 4 (four) times daily -  before meals and at bedtime. (Patient not taking: Reported on 04/18/2020), Disp: , Rfl:    esomeprazole (NEXIUM) 40 MG packet, Take 40 mg by mouth daily., Disp: , Rfl:    famotidine (PEPCID) 20 MG tablet, Take 1 tablet (20 mg total) by mouth 2 (two) times daily. (Patient not taking: Reported on 04/18/2020), Disp: 30 tablet, Rfl: 0   Fremanezumab-vfrm (AJOVY) 225 MG/1.5ML SOSY, Inject 225 mg into the skin every 28 (twenty-eight) days. (Patient not taking: Reported on 04/02/2022), Disp: 1.68 mL, Rfl: 5   HYDROcodone-acetaminophen (NORCO/VICODIN) 5-325 MG tablet, Take 1 tablet by mouth every 6 (six) hours as needed for severe pain., Disp: 12 tablet, Rfl: 0   ibuprofen (ADVIL) 800 MG tablet, Take 1 tablet (800 mg total) by mouth 3 (three) times daily. (Patient not taking: Reported on 10/05/2020), Disp: 21 tablet, Rfl: 0   predniSONE (DELTASONE) 20 MG tablet, Take 2 tablets (40 mg total) by mouth daily with breakfast. For the next four days, Disp: 8 tablet, Rfl: 0   sucralfate (CARAFATE) 1 GM/10ML suspension, Take 10 mLs (1 g total) by mouth 4 (four) times daily -  with meals and at bedtime. (Patient not taking: Reported on 04/02/2022), Disp: 420 mL, Rfl: 0   tinidazole (TINDAMAX) 500 MG tablet, Take 4 tablets (2,000 mg total) by mouth daily with breakfast., Disp: 4 tablet, Rfl: 0   Ubrogepant (UBRELVY) 100 MG  TABS, Take 1 tablet by mouth as needed (May repeat in 2 hours. Maximum 2 tablets in 24 hours). (Patient not taking: Reported on 04/02/2022), Disp: 16 tablet, Rfl: 5  Observations/Objective: Patient is well-developed, well-nourished in no acute distress.  Resting comfortably  at home.  Head is normocephalic, atraumatic.  No labored breathing.  Speech is clear and coherent with logical content.  Patient is alert and oriented at baseline.    Assessment and Plan: 1. Chronic vaginitis Schedule follow up with OB (next available was 2-3 weeks) for testing and guidance on further preventative regimens.   - metroNIDAZOLE (METROGEL) 0.75 % vaginal gel; Place 1 Applicatorful vaginally at bedtime for 7 days.  Dispense: 70 g; Refill: 0     Follow Up Instructions: I discussed the assessment and treatment plan with the patient. The patient was provided an opportunity to ask questions and all were answered. The patient agreed with the plan  and demonstrated an understanding of the instructions.  A copy of instructions were sent to the patient via MyChart unless otherwise noted below.    The patient was advised to call back or seek an in-person evaluation if the symptoms worsen or if the condition fails to improve as anticipated.  Time:  I spent 10 minutes with the patient via telehealth technology discussing the above problems/concerns.    Apolonio Schneiders, FNP

## 2022-11-08 DIAGNOSIS — Z5181 Encounter for therapeutic drug level monitoring: Secondary | ICD-10-CM | POA: Diagnosis not present

## 2022-11-09 DIAGNOSIS — H9201 Otalgia, right ear: Secondary | ICD-10-CM | POA: Diagnosis not present

## 2022-11-09 DIAGNOSIS — H60311 Diffuse otitis externa, right ear: Secondary | ICD-10-CM | POA: Diagnosis not present

## 2022-11-10 ENCOUNTER — Ambulatory Visit
Admission: EM | Admit: 2022-11-10 | Discharge: 2022-11-10 | Disposition: A | Discharge status: Home / Self Care | Payer: BC Managed Care – PPO | Attending: Nurse Practitioner | Admitting: Nurse Practitioner

## 2022-11-10 DIAGNOSIS — H60501 Unspecified acute noninfective otitis externa, right ear: Secondary | ICD-10-CM | POA: Diagnosis not present

## 2022-11-10 DIAGNOSIS — H65191 Other acute nonsuppurative otitis media, right ear: Secondary | ICD-10-CM

## 2022-11-10 DIAGNOSIS — R110 Nausea: Secondary | ICD-10-CM

## 2022-11-10 MED ORDER — ONDANSETRON 4 MG PO TBDP: 4.0000 mg | ORAL_TABLET | Freq: Three times a day (TID) | ORAL | 0 refills | Status: DC | PRN

## 2022-11-10 MED ORDER — FLUTICASONE PROPIONATE 50 MCG/ACT NA SUSP: 2.0000 | Freq: Every day | NASAL | 0 refills | Status: DC

## 2022-11-10 MED ORDER — PREDNISONE 20 MG PO TABS: 40.0000 mg | ORAL_TABLET | Freq: Every day | ORAL | 0 refills | Status: AC

## 2022-11-10 MED ORDER — OFLOXACIN 0.3 % OT SOLN: 5.0000 [drp] | Freq: Two times a day (BID) | OTIC | 0 refills | Status: DC

## 2022-11-10 MED ORDER — OFLOXACIN 0.3 % OT SOLN: 5.0000 [drp] | Freq: Two times a day (BID) | OTIC | 0 refills | Status: AC

## 2022-11-10 MED ORDER — PREDNISONE 20 MG PO TABS: 40.0000 mg | ORAL_TABLET | Freq: Every day | ORAL | 0 refills | Status: DC

## 2022-11-10 NOTE — ED Provider Notes (Signed)
[facility]    CSN: [id number] Arrival date & time: 11/10/22  4098      History   Chief Complaint Chief Complaint  Patient presents with   Ear Drainage    Having ear pain - Entered by patient    HPI [name] is a 37 y.o. female.   The history is provided by the patient.   Patient presents for complaints of right ear pain, dizziness, and nausea.  Patient states symptoms started over the last several days.  She states over the past 24 hours, symptoms have suddenly worsen.  Patient states that she completed a telehealth visit, but was unable to pick up the medication.  She was prescribed amoxicillin, and an eardrop.  Patient complains of pain when she pulls the ear back, and when she.  She states that she has had recent nasal congestion.  Patient states that she can also hear "popping and crackling" noises in the right ear.  Patient denies fever, chills, headache, cough, chest pain, abdominal pain, vomiting, or diarrhea.  Patient states she has noticed that she has had nausea with certain movement and dizziness, since her ear symptoms started.  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   started antibiotic 06/20/2015   Panic attacks    PTSD (post-traumatic stress disorder)    Shoulder  dislocation 06/2015   right    Patient Active Problem List   Diagnosis Date Noted   Intractable chronic migraine without aura and with status migrainosus 03/24/2017   Acute appendicitis    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   Endometriosis 09/16/2012   CERVICALGIA 09/08/2007   CERVICAL SPASM 09/08/2007    Past Surgical History:  Procedure Laterality Date   BRAIN SURGERY     CESAREAN SECTION  09/05/2008   CYSTOSCOPY N/A 09/15/2012   Procedure: CYSTOSCOPY;  Surgeon: Roselle Locus II, MD;  Location: [facility];  Service: Gynecology;  Laterality: N/A;   DILATION AND EVACUATION  04/15/2007   HYSTEROSCOPY WITH D & C  03/31/2012   Procedure: DILATATION AND CURETTAGE /HYSTEROSCOPY;  Surgeon: Leslie Andrea, MD;  Location: [facility];  Service: Gynecology;  Laterality: N/A;   LAPAROSCOPIC APPENDECTOMY N/A 10/29/2016   Procedure: APPENDECTOMY LAPAROSCOPIC;  Surgeon: Franky Macho, MD;  Location: [facility];  Service: General;  Laterality: N/A;   LAPAROSCOPIC ASSISTED VAGINAL HYSTERECTOMY N/A 09/15/2012   Procedure: LAPAROSCOPIC ASSISTED VAGINAL HYSTERECTOMY;  Surgeon: Leslie Andrea, MD;  Location: [facility];  Service: Gynecology;  Laterality: N/A;   LAPAROSCOPY  03/31/2012   Procedure: LAPAROSCOPY OPERATIVE;  Surgeon: Leslie Andrea, MD;  Location: [facility];  Service: Gynecology;  Laterality: N/A;  with Biopsies of Bilateral Fallopian  Tubes; Fulguration of Endometriosis   SHOULDER ARTHROSCOPY WITH CAPSULORRHAPHY Right 06/28/2015   Procedure: RIGHT SHOULDER ARTHROSCOPY WITH CAPSULORRHAPHY;  Surgeon: Frederico Hamman, MD;  Location: [facility];  Service: Orthopedics;  Laterality: Right;   SUBOCCIPITAL CRANIECTOMY CERVICAL LAMINECTOMY N/A 08/08/2016   Procedure: SUBOCCIPITAL CRANIECTOMY CERVICAL LAMINECTOMY NUMBER DURAPLASTY;  Surgeon: Tressie Stalker, MD;  Location: [facility];  Service: Neurosurgery;  Laterality: N/A;  suboccipital   ULNAR NERVE REPAIR Right 05/2015    OB History      Gravida  2   Para  1   Term  1   Preterm      AB  1   Living  1      SAB  1   IAB      Ectopic      Multiple      Live Births               Home Medications    Prior to Admission medications   Medication Sig Start Date End Date Taking? Authorizing Provider  amphetamine-dextroamphetamine (ADDERALL) 20 MG tablet Take 20 mg by mouth daily.    Yes [provider]  esomeprazole (NEXIUM) 40 MG packet Take 40 mg by mouth daily. 03/26/22  Yes [provider]  fluticasone (FLONASE) 50 MCG/ACT nasal spray Place 2 sprays into both nostrils daily. 11/10/22  Yes Arizona Sorn-Warren, Sadie Haber, NP  ofloxacin (FLOXIN) 0.3 % OTIC solution Place 5 drops into the right ear 2 (two) times daily for 7 days. 11/10/22 11/17/22 Yes Ranay Ketter-Warren, Sadie Haber, NP  ondansetron (ZOFRAN-ODT) 4 MG disintegrating tablet Take 1 tablet (4 mg total) by mouth every 8 (eight) hours as needed. 11/10/22  Yes Jadynn Epping-Warren, Sadie Haber, NP  predniSONE (DELTASONE) 20 MG tablet Take 2 tablets (40 mg total) by mouth daily with breakfast for 5 days. 11/10/22 11/15/22 Yes Zadia Uhde-Warren, Sadie Haber, NP  benzonatate (TESSALON) 200 MG capsule Take 1 capsule (200 mg total) by mouth 3 (three) times daily as needed for cough. Swallow whole, do not chew Patient not taking: Reported on 10/05/2020 08/19/20   Pauline Aus, PA-C  buPROPion [facility] SR) 100 MG 12 hr tablet Wellbutrin SR 100 mg tablet, 12 hr sustained-release  Take 1 tablet every day by oral route. Patient not taking: No sig reported    [provider]  chlorhexidine (PERIDEX) 0.12 % solution Use as directed 15 mLs in the mouth or throat 2 (two) times daily. 04/06/22   Gerhard Munch, MD  dicyclomine (BENTYL) 10 MG capsule Take 10 mg by mouth 4 (four) times daily -  before meals and at bedtime. Patient not taking: Reported on 04/18/2020    [provider]  famotidine (PEPCID) 20 MG tablet Take 1 tablet (20 mg total) by mouth 2 (two)  times daily. Patient not taking: Reported on 04/18/2020 12/16/18   Vanetta Mulders, MD  Fremanezumab-vfrm (AJOVY) 225 MG/1.5ML SOSY Inject 225 mg into the skin every 28 (twenty-eight) days. Patient not taking: Reported on 04/02/2022 10/31/20   Drema Dallas, DO  HYDROcodone-acetaminophen (NORCO/VICODIN) 5-325 MG tablet Take 1 tablet by mouth every 6 (six) hours as needed for severe pain. 04/06/22   Gerhard Munch, MD  ibuprofen (ADVIL) 800 MG tablet Take 1 tablet (800 mg total) by mouth 3 (three) times daily. Patient not taking: Reported on 10/05/2020 08/19/20   Triplett, Tammy, PA-C  sucralfate (CARAFATE) 1 GM/10ML suspension Take 10 mLs (1 g total) by mouth 4 (four) times daily -  with meals and at bedtime.  Patient not taking: Reported on 04/02/2022 06/26/21   Benjiman Core, MD  tinidazole [facility]) 500 MG tablet Take 4 tablets (2,000 mg total) by mouth daily with breakfast. 07/26/22   [name], PA-C  Ubrogepant (UBRELVY) 100 MG TABS Take 1 tablet by mouth as needed (May repeat in 2 hours. Maximum 2 tablets in 24 hours). Patient not taking: Reported on 04/02/2022 12/20/20   Drema Dallas, DO    Family History Family History  Problem Relation Age of Onset   Anesthesia problems Father        hard to wake up post-op   Stroke Father    Varicose Veins Mother    Varicose Veins Maternal Grandmother     Social History Social History   Tobacco Use   Smoking status: Every Day    Packs/day: 1.00    Years: 10.00    Additional pack years: 0.00    Total pack years: 10.00    Types: Cigarettes   Smokeless tobacco: Never   Tobacco comments:    quit 2 months ago  Vaping Use   Vaping Use: Never used  Substance Use Topics   Alcohol use: Yes    Comment: occasionally    Drug use: No     Allergies   Dilaudid [hydromorphone hcl], Morphine, Morphine sulfate, Other, Adhesive [tape], Amoxicillin, and Morphine and related   Review of Systems Review of Systems Per HPI  Physical  Exam Triage Vital Signs ED Triage Vitals  Enc Vitals Group     BP 11/10/22 1012 119/79     Pulse Rate 11/10/22 1012 88     Resp 11/10/22 1012 20     Temp 11/10/22 1012 98.1 F (36.7 C)     Temp Source 11/10/22 1012 Oral     SpO2 11/10/22 1012 98 %     Weight 11/10/22 1010 197 lb (89.4 kg)     Height --      Head Circumference --      Peak Flow --      Pain Score 11/10/22 1009 8     Pain Loc --      Pain Edu? --      Excl. in GC? --    No data found.  Updated Vital Signs BP 119/79 (BP Location: Right Arm)   Pulse 88   Temp 98.1 F (36.7 C) (Oral)   Resp 20   Wt 197 lb (89.4 kg)   LMP 08/18/2012   SpO2 98%   BMI 30.85 kg/m   Visual Acuity Right Eye Distance:   Left Eye Distance:   Bilateral Distance:    Right Eye Near:   Left Eye Near:    Bilateral Near:     Physical Exam Vitals and nursing note reviewed.  Constitutional:      General: She is not in acute distress.    Appearance: Normal appearance.  HENT:     Head: Normocephalic.     Right Ear: External ear normal.     Left Ear: Tympanic membrane, ear canal and external ear normal.     Ears:     Comments: Tenderness to the tragus and pinna of the right ear.  Canal of the right ear is erythematous.  Right middle ear effusion is present.    Nose: Nose normal.     Mouth/Throat:     Mouth: Mucous membranes are moist.  Eyes:     Extraocular Movements: Extraocular movements intact.     Conjunctiva/sclera: Conjunctivae normal.  Pupils: Pupils are equal, round, and reactive to light.  Cardiovascular:     Rate and Rhythm: Normal rate and regular rhythm.     Pulses: Normal pulses.     Heart sounds: Normal heart sounds.  Pulmonary:     Effort: Pulmonary effort is normal. No respiratory distress.     Breath sounds: Normal breath sounds. No stridor. No wheezing, rhonchi or rales.  Abdominal:     General: Bowel sounds are normal.     Palpations: Abdomen is soft.     Tenderness: There is no abdominal  tenderness.  Musculoskeletal:     Cervical back: Normal range of motion.  Skin:    General: Skin is warm and dry.  Neurological:     General: No focal deficit present.     Mental Status: She is alert and oriented to person, place, and time.  Psychiatric:        Mood and Affect: Mood normal.        Behavior: Behavior normal.      UC Treatments / Results  Labs (all labs ordered are listed, but only abnormal results are displayed) Labs Reviewed - No data to display  EKG   Radiology No results found.  Procedures Procedures (including critical care time)  Medications Ordered in UC Medications - No data to display  Initial Impression / Assessment and Plan / UC Course  I have reviewed the triage vital signs and the nursing notes.  Pertinent labs & imaging results that were available during my care of the patient were reviewed by me and considered in my medical decision making (see chart for details).  The patient is well-appearing, she is in no acute distress, vital signs are stable.  Symptoms appear to be consistent with acute otitis externa.  Patient also with a right middle ear effusion.  Will treat patient's otitis externa with Floxin 0.3% otic drops, for the middle ear effusion, patient was prescribed prednisone 40 mg for the next 5 days along with fluticasone 50 mcg nasal spray, and for her nausea, Zofran 4 mg as needed.  Supportive care recommendations were provided and discussed with the patient to include over-the-counter analgesics for pain or discomfort, avoiding entrance of water into the right ear, and beginning an over-the-counter allergy medicine such as Allegra-D.  Patient was advised that if symptoms do not improve, recommend beginning the amoxicillin that she was previously prescribed.  Patient is in agreement with this plan of care and verbalizes understanding.  All questions were answered.  Patient stable for discharge.   Final Clinical Impressions(s) / UC  Diagnoses   Final diagnoses:  Acute otitis externa of right ear, unspecified type  Acute MEE (middle ear effusion), right  Nausea without vomiting     Discharge Instructions      Take medication as prescribed. Increase fluids and allow for plenty of rest. May take over-the-counter Tylenol as needed for pain, fever, or general discomfort. As discussed, it is recommended that you begin an allergy medication such as Allegra-D daily. Warm compresses to the right ear to help with pain or discomfort. Avoid entrance of water and to the right ear while symptoms persist. As discussed, if symptoms do not improve with this treatment, recommend starting the amoxicillin that was previously prescribed. Follow-up in this clinic or with your primary care physician if symptoms fail to improve. Follow-up as needed.     ED Prescriptions     Medication Sig Dispense Auth. Provider   ofloxacin (FLOXIN) 0.3 % OTIC  solution Place 5 drops into the right ear 2 (two) times daily for 7 days. 5 mL Dray Dente-Warren, Sadie Haber, NP   predniSONE (DELTASONE) 20 MG tablet Take 2 tablets (40 mg total) by mouth daily with breakfast for 5 days. 10 tablet Moneisha Vosler-Warren, Sadie Haber, NP   fluticasone (FLONASE) 50 MCG/ACT nasal spray Place 2 sprays into both nostrils daily. 16 g Murray Durrell-Warren, Sadie Haber, NP   ondansetron (ZOFRAN-ODT) 4 MG disintegrating tablet Take 1 tablet (4 mg total) by mouth every 8 (eight) hours as needed. 20 tablet Ulus Hazen-Warren, Sadie Haber, NP      PDMP not reviewed this encounter.   Abran Cantor, NP 11/10/22 1043

## 2022-11-10 NOTE — ED Triage Notes (Signed)
Pt states that she is having itching in her right ear since Wednesday. Started hurting yesterday. Upset stomach nausea

## 2022-11-10 NOTE — Discharge Instructions (Addendum)
Take medication as prescribed. Increase fluids and allow for plenty of rest. May take over-the-counter Tylenol as needed for pain, fever, or general discomfort. As discussed, it is recommended that you begin an allergy medication such as Allegra-D daily. Warm compresses to the right ear to help with pain or discomfort. Avoid entrance of water and to the right ear while symptoms persist. As discussed, if symptoms do not improve with this treatment, recommend starting the amoxicillin that was previously prescribed. Follow-up in this clinic or with your primary care physician if symptoms fail to improve. Follow-up as needed.

## 2022-12-24 DIAGNOSIS — F3340 Major depressive disorder, recurrent, in remission, unspecified: Secondary | ICD-10-CM | POA: Diagnosis not present

## 2022-12-24 DIAGNOSIS — R41840 Attention and concentration deficit: Secondary | ICD-10-CM | POA: Diagnosis not present

## 2023-02-01 DIAGNOSIS — M79641 Pain in right hand: Secondary | ICD-10-CM | POA: Diagnosis not present

## 2023-02-06 DIAGNOSIS — M79641 Pain in right hand: Secondary | ICD-10-CM | POA: Diagnosis not present

## 2023-03-03 IMAGING — MR MR CERVICAL SPINE W/O CM
5 series · 44 of 48 positions shown · non-contrast
Comparison: MRI of the cervical spine 04/04/2015

CLINICAL DATA: Cervicalgia. Chiari malformation. Severe pain
extending into the left upper extremity.

EXAM:
MRI CERVICAL SPINE WITHOUT CONTRAST
TECHNIQUE: Multiplanar, multisequence MR imaging of the cervical spine was
performed. No intravenous contrast was administered.

[Series 5: T2 · sagittal · 3.0mm · 0.52mm/px · 7 of 15 slices shown (1 of 2)]
[im 1/15]
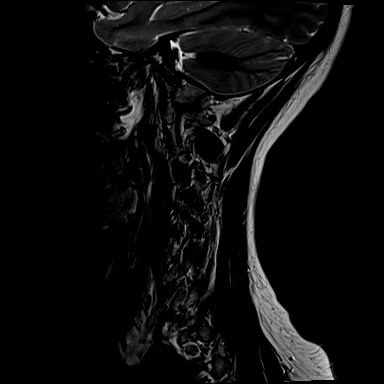
[im 3/15]
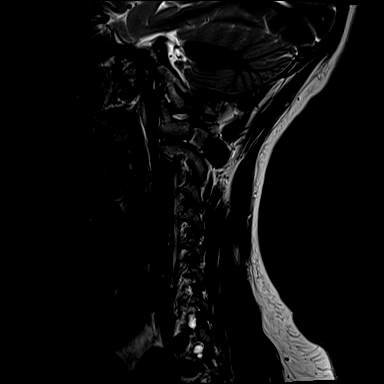
[im 5/15]
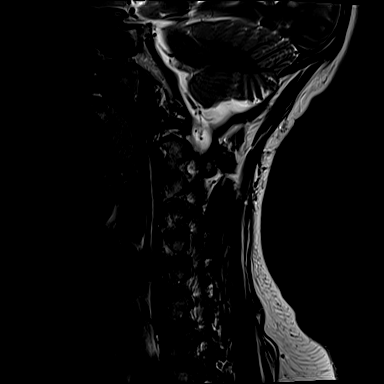
[im 8/15]
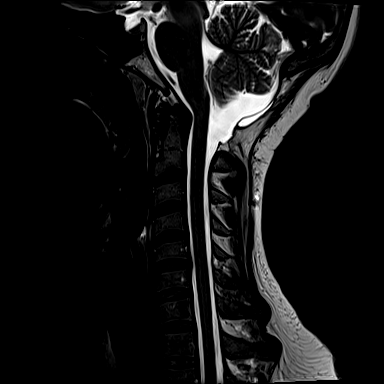
[im 10/15]
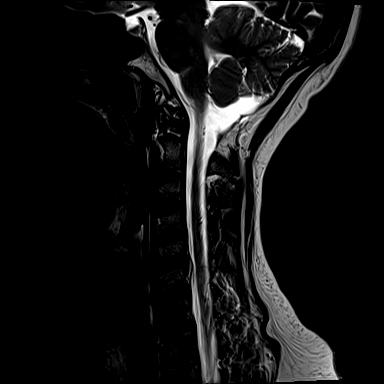
[im 12/15]
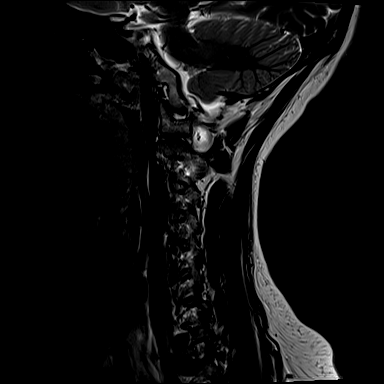
[im 15/15]
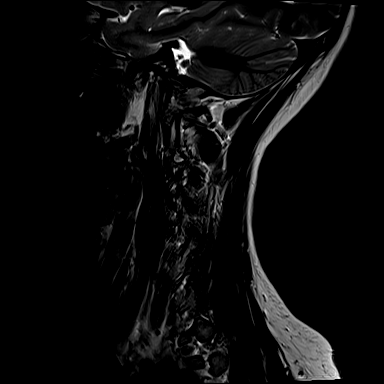

[Series 6: T1 · sagittal · 3.0mm · 0.52mm/px · 7 of 15 slices shown]
[im 1/15]
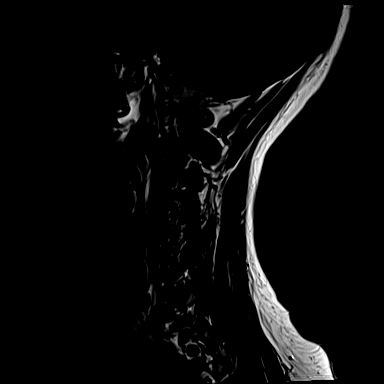
[im 3/15]
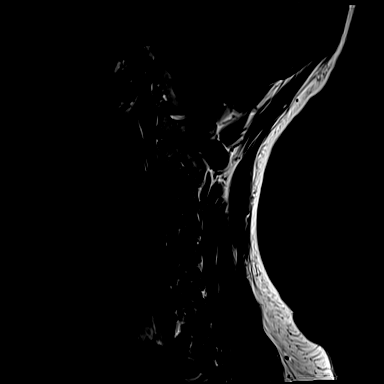
[im 5/15]
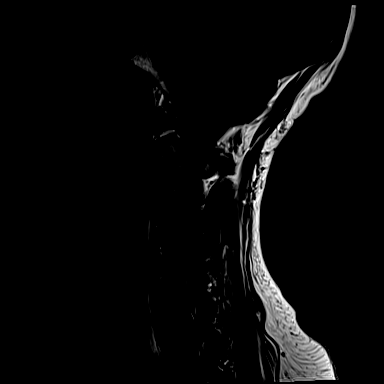
[im 8/15]
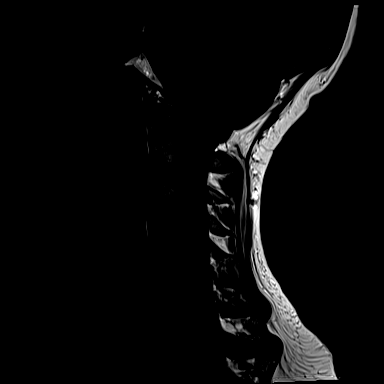
[im 10/15]
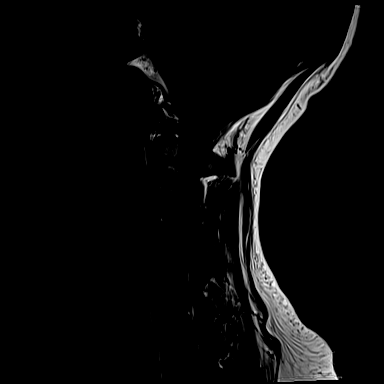
[im 12/15]
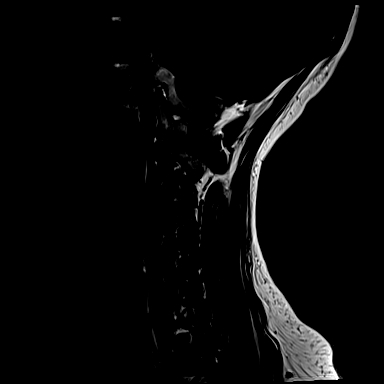
[im 15/15]
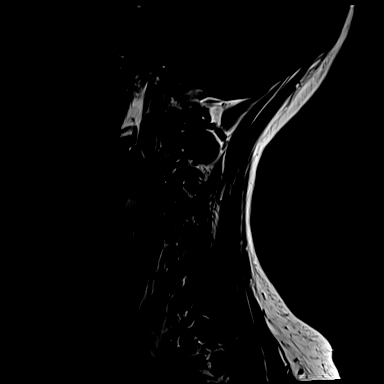

[Series 7: STIR · sagittal · 3.0mm · 0.52mm/px · 6 of 15 slices shown]
[im 1/15]
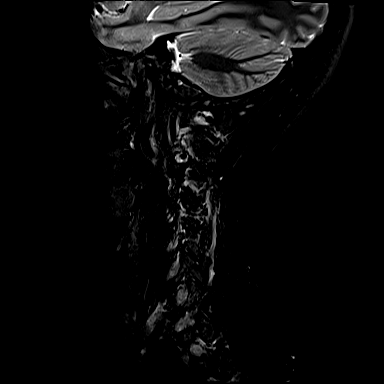
[im 3/15]
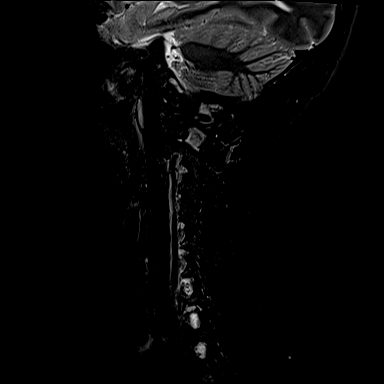
[im 6/15]
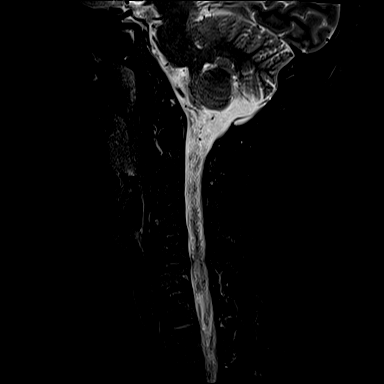
[im 9/15]
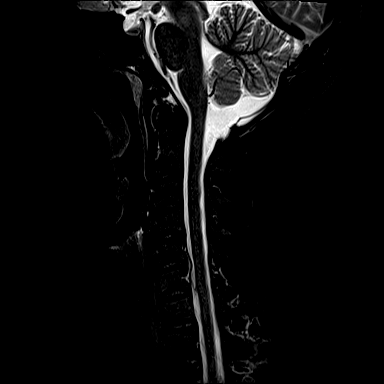
[im 12/15]
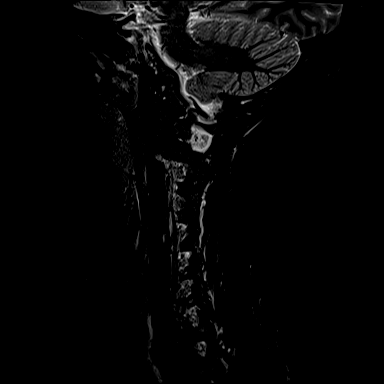
[im 15/15]
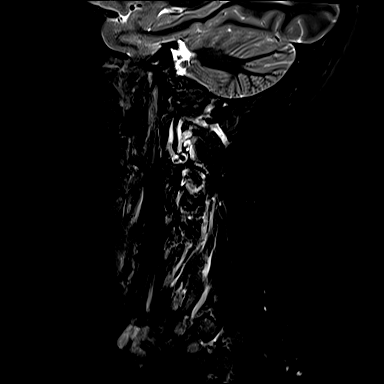

[Series 8: T2 · axial · 3.0mm · 0.50mm/px · z∈[-52,+54]mm · 14 of 34 slices shown (2 of 2)]
[im 1/34]
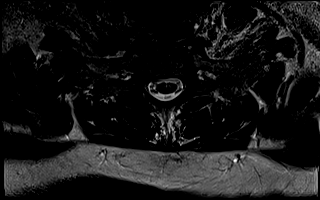
[im 3/34]
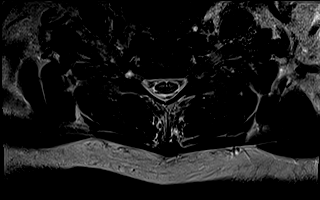
[im 6/34]
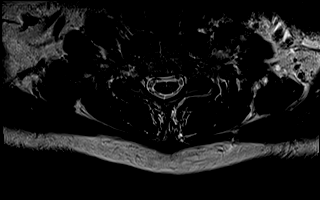
[im 8/34]
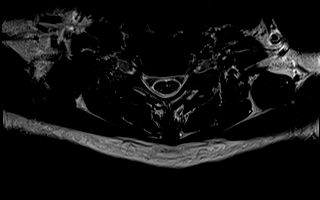
[im 11/34]
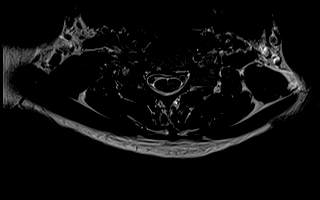
[im 13/34]
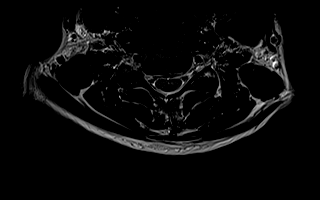
[im 16/34]
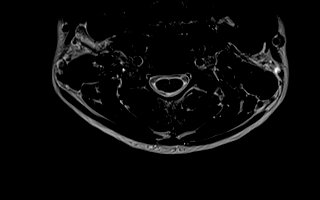
[im 18/34]
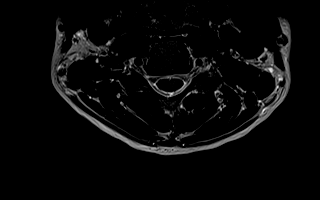
[im 21/34]
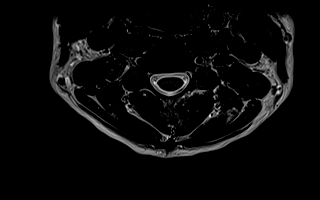
[im 23/34]
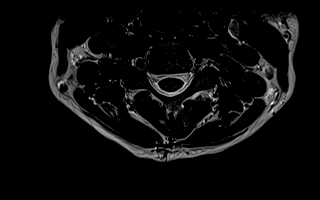
[im 26/34]
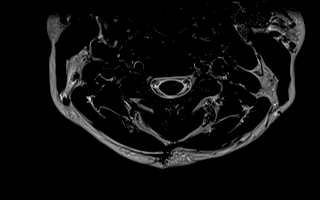
[im 28/34]
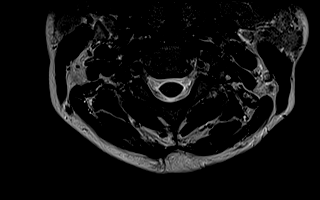
[im 31/34]
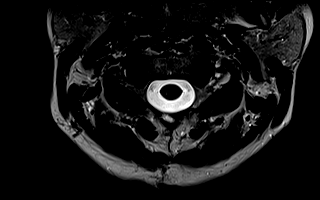
[im 34/34]
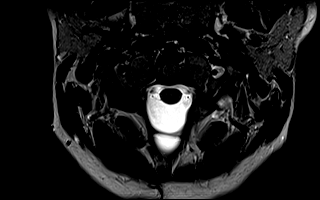

[Series 9: GRE · axial · 3.0mm · 0.42mm/px · z∈[-52,+54]mm · 10 of 34 slices shown]
[im 1/34]
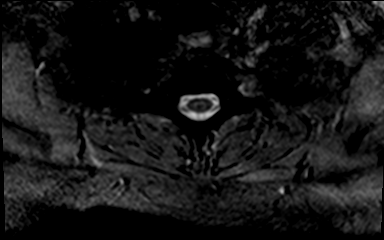
[im 3/34]
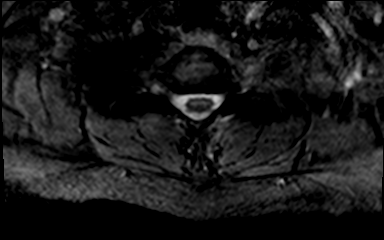
[im 6/34]
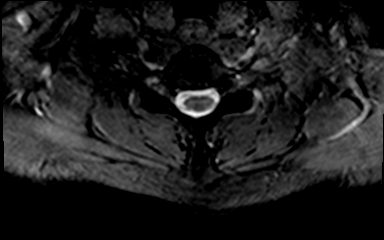
[im 8/34]
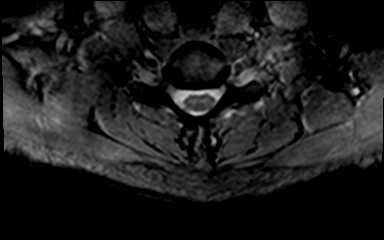
[im 11/34]
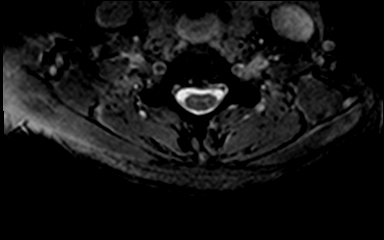
[im 16/34]
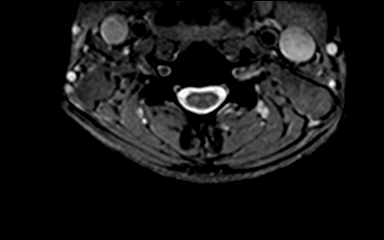
[im 18/34]
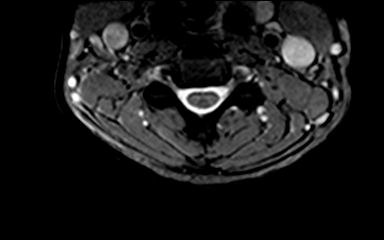
[im 23/34]
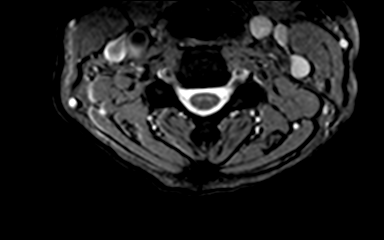
[im 28/34]
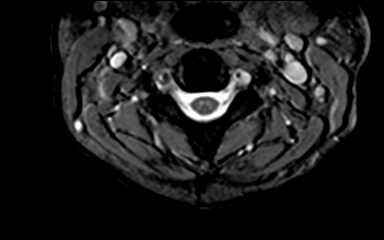
[im 34/34]
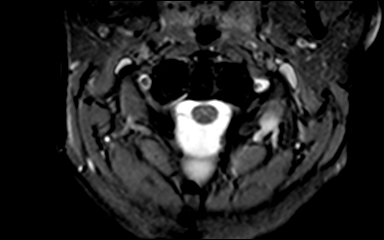

[44 of 48 positions shown; findings below may reference images not displayed]

FINDINGS: Alignment: No significant listhesis is present. Straightening of the
normal cervical lordosis is present.

Vertebrae: Marrow signal and vertebral body heights are normal.

Cord: Normal signal and morphology.

Posterior Fossa, vertebral arteries, paraspinal tissues:
Suboccipital craniotomy noted. Posterior fossa structures are
normal. No residual Chiari malformation is present. Craniocervical
junction otherwise normal. Flow is present in the vertebral arteries
bilaterally.

Disc levels:

C1-2: Negative.

C2-3: Negative.

C3-4: Negative.

C4-5: Negative.

C5-6: A left paramedian soft disc protrusion potentially irritates
left-sided nerve root. The foramen is patent. Mild right foraminal
narrowing is due to uncovertebral spurring.

C6-7: Negative.

C7-T1: Negative.
IMPRESSION: 1. Left paramedian soft disc protrusion at C5-6 potentially
irritates the left-sided nerve roots.
2. Mild right foraminal narrowing at C5-6 due to uncovertebral
spurring.
3. No other significant disc disease.
4. Status post suboccipital craniotomy. No residual Chiari
malformation is present.

## 2023-03-19 DIAGNOSIS — F3340 Major depressive disorder, recurrent, in remission, unspecified: Secondary | ICD-10-CM | POA: Diagnosis not present

## 2023-03-27 DIAGNOSIS — F3340 Major depressive disorder, recurrent, in remission, unspecified: Secondary | ICD-10-CM | POA: Diagnosis not present

## 2023-03-31 DIAGNOSIS — N951 Menopausal and female climacteric states: Secondary | ICD-10-CM | POA: Diagnosis not present

## 2023-03-31 DIAGNOSIS — E8941 Symptomatic postprocedural ovarian failure: Secondary | ICD-10-CM | POA: Diagnosis not present

## 2023-03-31 DIAGNOSIS — Z1321 Encounter for screening for nutritional disorder: Secondary | ICD-10-CM | POA: Diagnosis not present

## 2023-03-31 DIAGNOSIS — Z13228 Encounter for screening for other metabolic disorders: Secondary | ICD-10-CM | POA: Diagnosis not present

## 2023-03-31 DIAGNOSIS — Z1329 Encounter for screening for other suspected endocrine disorder: Secondary | ICD-10-CM | POA: Diagnosis not present

## 2023-03-31 DIAGNOSIS — Z131 Encounter for screening for diabetes mellitus: Secondary | ICD-10-CM | POA: Diagnosis not present

## 2023-04-04 DIAGNOSIS — F3340 Major depressive disorder, recurrent, in remission, unspecified: Secondary | ICD-10-CM | POA: Diagnosis not present

## 2023-04-15 DIAGNOSIS — F3340 Major depressive disorder, recurrent, in remission, unspecified: Secondary | ICD-10-CM | POA: Diagnosis not present

## 2023-06-24 ENCOUNTER — Emergency Department (HOSPITAL_COMMUNITY)
Admission: EM | Admit: 2023-06-24 | Discharge: 2023-06-24 | Disposition: A | Discharge status: Home / Self Care | Payer: Medicaid Other | Attending: Emergency Medicine | Admitting: Emergency Medicine

## 2023-06-24 ENCOUNTER — Other Ambulatory Visit: Payer: Self-pay

## 2023-06-24 DIAGNOSIS — J029 Acute pharyngitis, unspecified: Secondary | ICD-10-CM | POA: Insufficient documentation

## 2023-06-24 DIAGNOSIS — H66004 Acute suppurative otitis media without spontaneous rupture of ear drum, recurrent, right ear: Secondary | ICD-10-CM | POA: Insufficient documentation

## 2023-06-24 MED ORDER — AZITHROMYCIN 250 MG PO TABS: 250.0000 mg | ORAL_TABLET | Freq: Every day | ORAL | 0 refills | Status: DC

## 2023-06-24 MED ORDER — DEXAMETHASONE 4 MG PO TABS
10.0000 mg | ORAL_TABLET | Freq: Once | ORAL | Status: AC
  Administered 2023-06-24: 10 mg via ORAL
  Filled 2023-06-24: qty 3

## 2023-06-24 NOTE — ED Triage Notes (Signed)
C/o sore throat x2 days, was recently dx with ear infection and finished antibiotics on 12/19.

## 2023-06-24 NOTE — ED Provider Notes (Signed)
[place] EMERGENCY DEPARTMENT AT [facility] Provider Note   CSN: [id number] Arrival date & time: 06/24/23  3664     History  Chief Complaint  Patient presents with   Sore Throat    [name] is a 37 y.o. female.  The history is provided by the patient.  Sore Throat This is a new problem. The current episode started yesterday. The problem has been gradually worsening. The symptoms are aggravated by swallowing. Nothing relieves the symptoms.   Patient reports she has had recurrent ear infections and just completed a course of amoxicillin.  Over the past days she has had sore throat mostly on the right side as well as return of her right ear pain.  No drainage.  She has been on at least 3 rounds of antibiotics this past year for ear infections No previous ear surgeries    Home Medications Prior to Admission medications   Medication Sig Start Date End Date Taking? Authorizing Provider  azithromycin (ZITHROMAX) 250 MG tablet Take 1 tablet (250 mg total) by mouth daily. Take first 2 tablets together, then 1 every day until finished. 06/24/23  Yes Zadie Rhine, MD  amphetamine-dextroamphetamine (ADDERALL) 20 MG tablet Take 20 mg by mouth daily.     [provider]  buPROPion (WELLBUTRIN SR) 100 MG 12 hr tablet Wellbutrin SR 100 mg tablet, 12 hr sustained-release  Take 1 tablet every day by oral route. Patient not taking: No sig reported    [provider]  chlorhexidine (PERIDEX) 0.12 % solution Use as directed 15 mLs in the mouth or throat 2 (two) times daily. 04/06/22   Gerhard Munch, MD  esomeprazole (NEXIUM) 40 MG packet Take 40 mg by mouth daily. 03/26/22   [provider]  famotidine (PEPCID) 20 MG tablet Take 1 tablet (20 mg total) by mouth 2 (two) times daily. Patient not taking: Reported on 04/18/2020 12/16/18   Vanetta Mulders, MD  fluticasone [facility]) 50 MCG/ACT nasal spray Place 2 sprays into both nostrils daily. 11/10/22    Leath-Warren, Sadie Haber, NP  Fremanezumab-vfrm (AJOVY) 225 MG/1.5ML SOSY Inject 225 mg into the skin every 28 (twenty-eight) days. Patient not taking: Reported on 04/02/2022 10/31/20   Drema Dallas, DO  Ubrogepant (UBRELVY) 100 MG TABS Take 1 tablet by mouth as needed (May repeat in 2 hours. Maximum 2 tablets in 24 hours). Patient not taking: Reported on 04/02/2022 12/20/20   Drema Dallas, DO      Allergies    Dilaudid [hydromorphone hcl], Morphine, Morphine sulfate, Other, Adhesive [tape], Amoxicillin, and Morphine and codeine    Review of Systems   Review of Systems  Constitutional:  Negative for fever.  HENT:  Positive for ear pain.   Respiratory:  Negative for cough.   Gastrointestinal:  Negative for vomiting.    Physical Exam Updated Vital Signs BP (!) 117/100   Pulse 96   Temp 98.1 F (36.7 C) (Oral)   Resp 19   LMP 08/18/2012   SpO2 100%  Physical Exam CONSTITUTIONAL: Well developed/well nourished HEAD: Normocephalic/atraumatic EYES: EOMI/PERRL ENMT: Mucous membranes moist, uvula midline, mild erythema, no exudates, no angioedema No stridor or drooling Left TM clear and intact.  Right TM is erythematous NECK: supple no meningeal signs No cervical lymphadenopathy noted CV: S1/S2 noted LUNGS: Lungs are clear to auscultation bilaterally, no apparent distress NEURO: Pt is awake/alert/appropriate, moves all extremitiesx4.  No facial droop.  Patient ambulates with difficulty  ED Results / Procedures / Treatments  Labs (all labs ordered are listed, but only abnormal results are displayed) Labs Reviewed - No data to display  EKG None  Radiology No results found.  Procedures Procedures    Medications Ordered in ED Medications  dexamethasone (DECADRON) tablet 10 mg (10 mg Oral Given 06/24/23 0503)    ED Course/ Medical Decision Making/ A&P                                 Medical Decision Making Risk Prescription drug management.   Patient overall  well-appearing, no acute distress Likely viral pharyngitis, will defer testing, one-time dose of Decadron. Patient with recurrent otalgia appears to have a recurrent otitis media.  Will trial a different antibiotic and refer to otolaryngology        Final Clinical Impression(s) / ED Diagnoses Final diagnoses:  Pharyngitis, unspecified etiology  Recurrent acute suppurative otitis media of right ear without spontaneous rupture of tympanic membrane    Rx / DC Orders ED Discharge Orders          Ordered    azithromycin (ZITHROMAX) 250 MG tablet  Daily        06/24/23 0457              Zadie Rhine, MD 06/24/23 [phone]

## 2023-07-15 ENCOUNTER — Telehealth: Payer: Medicaid Other | Admitting: Physician Assistant

## 2023-07-15 DIAGNOSIS — H9201 Otalgia, right ear: Secondary | ICD-10-CM | POA: Diagnosis not present

## 2023-07-15 MED ORDER — LORATADINE 10 MG PO TABS: 10.0000 mg | ORAL_TABLET | Freq: Every day | ORAL | 0 refills | Status: DC

## 2023-07-15 MED ORDER — FLUCONAZOLE 150 MG PO TABS: ORAL_TABLET | ORAL | 0 refills | Status: DC

## 2023-07-15 MED ORDER — AMOXICILLIN-POT CLAVULANATE 875-125 MG PO TABS: 1.0000 | ORAL_TABLET | Freq: Two times a day (BID) | ORAL | 0 refills | Status: AC

## 2023-07-15 NOTE — Patient Instructions (Signed)
 [name], thank you for joining Monica GORMAN Snuffer, PA-C for today's virtual visit.  While this provider is not your primary care provider (PCP), if your PCP is located in our provider database this encounter information will be shared with them immediately following your visit.   A [facility] MyChart account gives you access to today's visit and all your visits, tests, and labs performed at [facility]  click here if you don't have a [name] or go to [email or web address]  Consent: (Patient) [name] provided verbal consent for this virtual visit at the beginning of the encounter.  Current Medications:  Current Outpatient Medications:    amoxicillin -clavulanate (AUGMENTIN ) 875-125 MG tablet, Take 1 tablet by mouth 2 (two) times daily for 7 days., Disp: 14 tablet, Rfl: 0   fluconazole  (DIFLUCAN ) 150 MG tablet, Take one tablet on the day you fill the prescription. If you continue to have symptoms then take the second tablet in 3 days., Disp: 2 tablet, Rfl: 0   loratadine  (CLARITIN ) 10 MG tablet, Take 1 tablet (10 mg total) by mouth daily., Disp: 30 tablet, Rfl: 0   amphetamine-dextroamphetamine (ADDERALL) 20 MG tablet, Take 20 mg by mouth daily. , Disp: , Rfl:    azithromycin  (ZITHROMAX ) 250 MG tablet, Take 1 tablet (250 mg total) by mouth daily. Take first 2 tablets together, then 1 every day until finished., Disp: 6 tablet, Rfl: 0   buPROPion (WELLBUTRIN SR) 100 MG 12 hr tablet, Wellbutrin SR 100 mg tablet, 12 hr sustained-release  Take 1 tablet every day by oral route. (Patient not taking: No sig reported), Disp: , Rfl:    chlorhexidine  (PERIDEX ) 0.12 % solution, Use as directed 15 mLs in the mouth or throat 2 (two) times daily., Disp: 120 mL, Rfl: 0   esomeprazole  (NEXIUM ) 40 MG packet, Take 40 mg by mouth daily., Disp: , Rfl:    famotidine  (PEPCID ) 20 MG tablet, Take 1 tablet (20 mg total) by mouth 2 (two) times daily. (Patient not taking:  Reported on 04/18/2020), Disp: 30 tablet, Rfl: 0   fluticasone  (FLONASE ) 50 MCG/ACT nasal spray, Place 2 sprays into both nostrils daily., Disp: 16 g, Rfl: 0   Fremanezumab -vfrm (AJOVY ) 225 MG/1.5ML SOSY, Inject 225 mg into the skin every 28 (twenty-eight) days. (Patient not taking: Reported on 04/02/2022), Disp: 1.68 mL, Rfl: 5   Ubrogepant  (UBRELVY ) 100 MG TABS, Take 1 tablet by mouth as needed (May repeat in 2 hours. Maximum 2 tablets in 24 hours). (Patient not taking: Reported on 04/02/2022), Disp: 16 tablet, Rfl: 5   Medications ordered in this encounter:  Meds ordered this encounter  Medications   loratadine  (CLARITIN ) 10 MG tablet    Sig: Take 1 tablet (10 mg total) by mouth daily.    Dispense:  30 tablet    Refill:  0   amoxicillin -clavulanate (AUGMENTIN ) 875-125 MG tablet    Sig: Take 1 tablet by mouth 2 (two) times daily for 7 days.    Dispense:  14 tablet    Refill:  0   fluconazole  (DIFLUCAN ) 150 MG tablet    Sig: Take one tablet on the day you fill the prescription. If you continue to have symptoms then take the second tablet in 3 days.    Dispense:  2 tablet    Refill:  0     *If you need refills on other medications prior to your next appointment, please contact your pharmacy*  Follow-Up: Call back or seek an in-person evaluation  if the symptoms worsen or if the condition fails to improve as anticipated.  [facility] [phone]  Other Instructions You were given a prescription for antibiotics. Please take the antibiotic prescription fully.   Take the claritin  as directed. Continue taking flonase  and use saline nasal spray as well. Use a humidifier in your room.  Please follow up with your primary care provider or ENT doctor in 1 week for reassessment    If you have been instructed to have an in-person evaluation today at a local Urgent Care facility, please use the link below. It will take you to a list of all of our available [facility], including address, phone number and hours of operation. Please do not delay care.  [facility]  If you or a family member do not have a primary care provider, use the link below to schedule a visit and establish care. When you choose a [facility] primary care physician or advanced practice provider, you gain a long-term partner in health. Find a Primary Care Provider  Learn more about [facility]'s in-office and virtual care options: [facility] - Get Care Now

## 2023-07-15 NOTE — Progress Notes (Signed)
 Ms. Monica, [name] are scheduled for a virtual visit with your provider today.    Just as we do with appointments in the office, we must obtain your consent to participate.  Your consent will be active for this visit and any virtual visit you may have with one of our providers in the next 365 days.    If you have a MyChart account, I can also send a copy of this consent to you electronically.  All virtual visits are billed to your insurance company just like a traditional visit in the office.  As this is a virtual visit, video technology does not allow for your provider to perform a traditional examination.  This may limit your provider's ability to fully assess your condition.  If your provider identifies any concerns that need to be evaluated in person or the need to arrange testing such as labs, EKG, etc, we will make arrangements to do so.    Although advances in technology are sophisticated, we cannot ensure that it will always work on either your end or our end.  If the connection with a video visit is poor, we may have to switch to a telephone visit.  With either a video or telephone visit, we are not always able to ensure that we have a secure connection.   I need to obtain your verbal consent now.   Are you willing to proceed with your visit today?   [name] has provided verbal consent on 07/15/2023 for a virtual visit (video or telephone).   Lynden GORMAN Snuffer, PA-C 07/15/2023  11:43 AM   Date:  07/15/2023   ID:  [name], DOB 1986-03-31, MRN [id number]  Patient Location: Home Provider Location: Home Office   Participants: Patient and Provider for Visit and Wrap up  Method of visit: Video  Location of Patient: Home Location of Provider: Home Office Consent was obtain for visit over the video. Services rendered by provider: Visit was performed via video  A video enabled telemedicine application was used and I verified that I am speaking with the correct person using two  identifiers.  PCP:  Sun, Vyvyan, MD   Chief Complaint:  ear problem  History of Present Illness:    [name] is a 38 y.o. female with history as stated below. Presents video telehealth for an acute care visit  Pt reports right ear pain, tinnitus, sore throat. She reports her hearing is muffled as well. She had similar sxs 06/24/23 and was dx with an ear infection and was told she had fluid behind her ear. She reports sxs feel similar. Denies fever. She does note that she has had thick nasal congestion. She has been using fluticasone  without relief.  Has an appt with ENT in 1 month.  Past Medical, Surgical, Social History, Allergies, and Medications have been Reviewed.  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   fluconazole  (DIFLUCAN ) 150 MG tablet Take one tablet on the day you fill the prescription. If you continue to have symptoms then take the second tablet in 3 days.   loratadine  (CLARITIN ) 10 MG tablet Take 1 tablet (10 mg  total) by mouth daily.     Allergies:   Dilaudid  [hydromorphone  hcl], Morphine , Morphine  sulfate, Other, Adhesive [tape], and Morphine  and codeine    ROS See HPI for history of present illness.  Physical Exam Constitutional:      Appearance: Normal appearance.  Neurological:     Mental Status: She is alert.              MDM: pt with right ear pain, hx of otitis media and Eustachian tube dysfunction. Will tx with augmentin . Advised continuation of flonase . Also recommended initiation of antihistamine, saline nasal spray and humidifier.    Tests Ordered: No orders of the defined types were placed in this encounter.   Medication Changes: Meds ordered this encounter  Medications   loratadine  (CLARITIN ) 10 MG tablet    Sig: Take 1 tablet (10 mg total) by mouth daily.    Dispense:  30 tablet    Refill:  0   amoxicillin -clavulanate (AUGMENTIN ) 875-125 MG tablet    Sig: Take 1 tablet by mouth 2 (two) times daily for 7 days.    Dispense:  14 tablet    Refill:  0   fluconazole  (DIFLUCAN ) 150 MG tablet    Sig: Take one tablet on the day you fill the prescription. If you continue to have symptoms then take the second tablet in 3 days.    Dispense:  2 tablet    Refill:  0     Disposition:  Follow up  Signed, Lynden GORMAN Snuffer, PA-C  07/15/2023 11:43 AM

## 2023-10-13 ENCOUNTER — Ambulatory Visit
Admission: RE | Admit: 2023-10-13 | Discharge: 2023-10-13 | Disposition: A | Discharge status: Home / Self Care | Source: Ambulatory Visit

## 2023-10-13 VITALS — BP 160/88 | HR 88 | Temp 98.1°F | Resp 18

## 2023-10-13 DIAGNOSIS — J069 Acute upper respiratory infection, unspecified: Secondary | ICD-10-CM | POA: Diagnosis not present

## 2023-10-13 MED ORDER — BENZONATATE 100 MG PO CAPS: 100.0000 mg | ORAL_CAPSULE | Freq: Three times a day (TID) | ORAL | 0 refills | Status: DC | PRN

## 2023-10-13 MED ORDER — AZITHROMYCIN 250 MG PO TABS: ORAL_TABLET | ORAL | 0 refills | Status: DC

## 2023-10-13 NOTE — ED Provider Notes (Signed)
 [facility]    CSN: [id number] Arrival date & time: 10/13/23  0948      History   Chief Complaint Chief Complaint  Patient presents with  . Cough    Nasal and chest congestion. - Entered by patient    HPI [name] is a 38 y.o. female.   Sore throat started 4/3, self treated with doxycycline, congestion started 4/9 Can't take amox or aug  Fever,b ody aches, chilsl, chest congestion and cough, nasal congestion, headache, ear pain  No n/v/d  Child sick also    Past Medical History:  Diagnosis Date  . Acute appendicitis   . Anxiety   . 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 CERVICAL SPASM 09/08/2007   Qualifier: Diagnosis of  By: Phyllis Breeze MD, Arvel Lather    . Cervicalgia   . 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 Childhood asthma    seasonal with allergies  . Complication of anesthesia    hard to wake up post-op  . Depression   . Endometriosis 09/16/2012  . Family history of adverse reaction to anesthesia    pt's father has hx. of being hard to wake up post-op  . Headache   . History of Chiari malformation   . History of MRSA infection    axilla  . 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 Panic attacks   . PTSD (post-traumatic stress disorder)   . Shoulder dislocation 06/2015   right    Patient Active Problem List   Diagnosis Date Noted  . 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 Acute appendicitis   . 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 Endometriosis 09/16/2012  . CERVICALGIA 09/08/2007  . CERVICAL SPASM 09/08/2007    Past Surgical History:  Procedure Laterality Date  . BRAIN SURGERY    . CESAREAN SECTION  09/05/2008  . CYSTOSCOPY N/A 09/15/2012   Procedure: CYSTOSCOPY;  Surgeon: Jeanmarie Millet, MD;  Location: [facility];  Service: Gynecology;   Laterality: N/A;  . DILATION AND EVACUATION  04/15/2007  . HYSTEROSCOPY WITH D & C  03/31/2012   Procedure: DILATATION AND CURETTAGE /HYSTEROSCOPY;  Surgeon: Jeanmarie Millet, MD;  Location: [facility];  Service: Gynecology;  Laterality: N/A;  . LAPAROSCOPIC APPENDECTOMY N/A 10/29/2016   Procedure: APPENDECTOMY LAPAROSCOPIC;  Surgeon: Alanda Allegra, MD;  Location: [facility];  Service: General;  Laterality: N/A;  . LAPAROSCOPIC ASSISTED VAGINAL HYSTERECTOMY N/A 09/15/2012   Procedure: LAPAROSCOPIC ASSISTED VAGINAL HYSTERECTOMY;  Surgeon: Jeanmarie Millet, MD;  Location: [facility];  Service: Gynecology;  Laterality: N/A;  . LAPAROSCOPY  03/31/2012   Procedure: LAPAROSCOPY OPERATIVE;  Surgeon: Jeanmarie Millet, MD;  Location: [facility];  Service: Gynecology;  Laterality: N/A;  with Biopsies of Bilateral Fallopian Tubes; Fulguration of Endometriosis  . SHOULDER ARTHROSCOPY WITH CAPSULORRHAPHY Right 06/28/2015   Procedure: RIGHT SHOULDER ARTHROSCOPY WITH CAPSULORRHAPHY;  Surgeon: Marlena Sima, MD;  Location: [facility];  Service: Orthopedics;  Laterality: Right;  . SUBOCCIPITAL CRANIECTOMY CERVICAL LAMINECTOMY N/A 08/08/2016   Procedure: SUBOCCIPITAL CRANIECTOMY CERVICAL LAMINECTOMY NUMBER DURAPLASTY;  Surgeon: Garry Kansas, MD;  Location[facility];  Service: Neurosurgery;  Laterality: N/A;  suboccipital  . ULNAR NERVE REPAIR Right 05/2015    OB History     Gravida  2   Para  1   Term  1   Preterm  AB  1   Living  1      SAB  1   IAB      Ectopic      Multiple      Live Births               Home Medications    Prior to Admission medications   Medication Sig Start Date End Date Taking? Authorizing Provider  atomoxetine (STRATTERA) 60 MG capsule Take 60 mg by mouth every morning. 09/17/23  Yes [provider]  azithromycin (ZITHROMAX) 250 MG tablet Take (2) tablets by mouth on day 1, then take (1) tablet by mouth on days 2-5. 10/13/23  Yes Wilhemena Harbour, NP  benzonatate (TESSALON) 100 MG capsule Take 1 capsule (100 mg total) by mouth 3 (three) times daily as needed for cough. Do not take with alcohol or while operating or driving heavy machinery 02/08/90  Yes Thena Fireman A, NP  amphetamine-dextroamphetamine (ADDERALL) 20 MG tablet Take 20 mg by mouth daily.     [provider]  buPROPion (WELLBUTRIN SR) 100 MG 12 hr tablet Wellbutrin SR 100 mg tablet, 12 hr sustained-release  Take 1 tablet every day by oral route. Patient not taking: No sig reported    [provider]  chlorhexidine (PERIDEX) 0.12 % solution Use as directed 15 mLs in the mouth or throat 2 (two) times daily. 04/06/22   Dorenda Gandy, MD  esomeprazole (NEXIUM) 40 MG packet Take 40 mg by mouth daily. 03/26/22   [provider]  famotidine (PEPCID) 20 MG tablet Take 1 tablet (20 mg total) by mouth 2 (two) times daily. Patient not taking: Reported on 04/18/2020 12/16/18   Zackowski, Scott, MD  fluconazole (DIFLUCAN) 150 MG tablet Take one tablet on the day you fill the prescription. If you continue to have symptoms then take the second tablet in 3 days. 07/15/23   Couture, Cortni S, PA-C  fluticasone (FLONASE) 50 MCG/ACT nasal spray Place 2 sprays into both nostrils daily. 11/10/22   Leath-Warren, Belen Bowers, NP  Fremanezumab-vfrm (AJOVY) 225 MG/1.5ML SOSY Inject 225 mg into the skin every 28 (twenty-eight) days. Patient not taking: Reported on 04/02/2022 10/31/20   Merriam Abbey, DO  loratadine (CLARITIN) 10 MG tablet Take 1 tablet (10 mg total) by mouth daily. 07/15/23 08/14/23  Couture, Cortni S, PA-C  Ubrogepant (UBRELVY) 100 MG TABS Take 1 tablet by mouth as needed (May repeat in 2 hours. Maximum 2 tablets in 24 hours). Patient not taking: Reported on 04/02/2022 12/20/20   Merriam Abbey, DO    Family History Family History  Problem Relation Age of Onset  . Anesthesia problems Father        hard to wake up post-op  . Stroke Father   .  Varicose Veins Mother   . Varicose Veins Maternal Grandmother     Social History Social History   Tobacco Use  . Smoking status: Every Day    Current packs/day: 1.00    Average packs/day: 1 pack/day for 10.0 years (10.0 ttl pk-yrs)    Types: Cigarettes  . Smokeless tobacco: Never  . Tobacco comments:    quit 2 months ago  Vaping Use  . Vaping status: Never Used  Substance Use Topics  . Alcohol use: Yes    Comment: occasionally   . Drug use: No     Allergies   Dilaudid [hydromorphone hcl], Morphine, Morphine sulfate, Other, Adhesive [tape], and Morphine and codeine   Review of Systems  Review of Systems Per HPI  Physical Exam Triage Vital Signs ED Triage Vitals  Encounter Vitals Group     BP 10/13/23 1018 (!) 160/88     Systolic BP Percentile --      Diastolic BP Percentile --      Pulse Rate 10/13/23 1018 88     Resp 10/13/23 1018 18     Temp 10/13/23 1018 98.1 F (36.7 C)     Temp Source 10/13/23 1018 Oral     SpO2 10/13/23 1018 98 %     Weight --      Height --      Head Circumference --      Peak Flow --      Pain Score 10/13/23 1020 0     Pain Loc --      Pain Education --      Exclude from Growth Chart --    No data found.  Updated Vital Signs BP (!) 160/88 (BP Location: Right Arm)   Pulse 88   Temp 98.1 F (36.7 C) (Oral)   Resp 18   LMP 08/18/2012   SpO2 98%   Visual Acuity Right Eye Distance:   Left Eye Distance:   Bilateral Distance:    Right Eye Near:   Left Eye Near:    Bilateral Near:     Physical Exam   UC Treatments / Results  Labs (all labs ordered are listed, but only abnormal results are displayed) Labs Reviewed - No data to display  EKG   Radiology No results found.  Procedures Procedures (including critical care time)  Medications Ordered in UC Medications - No data to display  Initial Impression / Assessment and Plan / UC Course  I have reviewed the triage vital signs and the nursing notes.  Pertinent  labs & imaging results that were available during my care of the patient were reviewed by me and considered in my medical decision making (see chart for details).     *** Final Clinical Impressions(s) / UC Diagnoses   Final diagnoses:  Acute upper respiratory infection     Discharge Instructions      You have an upper respiratory infection.  Take the azithromycin as prescribed to treat it.  Symptoms should improve over the next week to 10 days.  If you develop chest pain or shortness of breath, go to the emergency room.  Some things that can make you feel better are: - Increased rest - Increasing fluid with water/sugar free electrolytes - Acetaminophen and ibuprofen as needed for fever/pain - Salt water gargling, chloraseptic spray and throat lozenges - OTC guaifenesin (Mucinex) 600 mg twice daily - Saline sinus flushes or a neti pot - Humidifying the air -Tessalon Perles every 8 hours as needed for dry cough    ED Prescriptions     Medication Sig Dispense Auth. Provider   azithromycin (ZITHROMAX) 250 MG tablet Take (2) tablets by mouth on day 1, then take (1) tablet by mouth on days 2-5. 6 tablet Cathlean Marseilles A, NP   benzonatate (TESSALON) 100 MG capsule Take 1 capsule (100 mg total) by mouth 3 (three) times daily as needed for cough. Do not take with alcohol or while operating or driving heavy machinery 21 capsule Valentino Nose, NP      PDMP not reviewed this encounter.

## 2023-10-13 NOTE — ED Triage Notes (Signed)
 Pt reports sore throat x 1 week, Thursday last week sore throat, nasal congestion, chest congestion and cough, low grade, fever , body aches, pain when coughing in the right side of chest. Mucus is green.

## 2023-10-13 NOTE — Discharge Instructions (Addendum)
 You have an upper respiratory infection.  Take the azithromycin as prescribed to treat it.  Symptoms should improve over the next week to 10 days.  If you develop chest pain or shortness of breath, go to the emergency room.  Some things that can make you feel better are: - Increased rest - Increasing fluid with water/sugar free electrolytes - Acetaminophen and ibuprofen as needed for fever/pain - Salt water gargling, chloraseptic spray and throat lozenges - OTC guaifenesin (Mucinex) 600 mg twice daily - Saline sinus flushes or a neti pot - Humidifying the air -Tessalon Perles every 8 hours as needed for dry cough

## 2023-10-27 ENCOUNTER — Emergency Department (HOSPITAL_COMMUNITY)

## 2023-10-27 ENCOUNTER — Encounter (HOSPITAL_COMMUNITY): Payer: Self-pay | Admitting: Emergency Medicine

## 2023-10-27 ENCOUNTER — Other Ambulatory Visit: Payer: Self-pay

## 2023-10-27 ENCOUNTER — Emergency Department (HOSPITAL_COMMUNITY)
Admission: EM | Admit: 2023-10-27 | Discharge: 2023-10-27 | Disposition: A | Discharge status: Home / Self Care | Attending: Emergency Medicine | Admitting: Emergency Medicine

## 2023-10-27 DIAGNOSIS — N83202 Unspecified ovarian cyst, left side: Secondary | ICD-10-CM | POA: Insufficient documentation

## 2023-10-27 DIAGNOSIS — E279 Disorder of adrenal gland, unspecified: Secondary | ICD-10-CM | POA: Insufficient documentation

## 2023-10-27 DIAGNOSIS — R1033 Periumbilical pain: Secondary | ICD-10-CM | POA: Diagnosis present

## 2023-10-27 DIAGNOSIS — R109 Unspecified abdominal pain: Secondary | ICD-10-CM

## 2023-10-27 DIAGNOSIS — E278 Other specified disorders of adrenal gland: Secondary | ICD-10-CM

## 2023-10-27 LAB — CBC
HCT: 46.3 % — ABNORMAL HIGH (ref 36.0–46.0)
Hemoglobin: 15.6 g/dL — ABNORMAL HIGH (ref 12.0–15.0)
MCH: 32.3 pg (ref 26.0–34.0)
MCHC: 33.7 g/dL (ref 30.0–36.0)
MCV: 95.9 fL (ref 80.0–100.0)
Platelets: 256 10*3/uL (ref 150–400)
RBC: 4.83 MIL/uL (ref 3.87–5.11)
RDW: 12.7 % (ref 11.5–15.5)
WBC: 7.7 10*3/uL (ref 4.0–10.5)
nRBC: 0 % (ref 0.0–0.2)

## 2023-10-27 LAB — COMPREHENSIVE METABOLIC PANEL WITH GFR
ALT: 20 U/L (ref 0–44)
AST: 18 U/L (ref 15–41)
Albumin: 3.6 g/dL (ref 3.5–5.0)
Alkaline Phosphatase: 57 U/L (ref 38–126)
Anion gap: 8 (ref 5–15)
BUN: 10 mg/dL (ref 6–20)
CO2: 22 mmol/L (ref 22–32)
Calcium: 9.1 mg/dL (ref 8.9–10.3)
Chloride: 106 mmol/L (ref 98–111)
Creatinine, Ser: 0.71 mg/dL (ref 0.44–1.00)
GFR, Estimated: >60 mL/min (ref >60)
Glucose, Bld: 98 mg/dL (ref 70–99)
Potassium: 4.2 mmol/L (ref 3.5–5.1)
Sodium: 136 mmol/L (ref 135–145)
Total Bilirubin: 0.5 mg/dL (ref 0.0–1.2)
Total Protein: 7 g/dL (ref 6.5–8.1)

## 2023-10-27 LAB — URINALYSIS, ROUTINE W REFLEX MICROSCOPIC
Bacteria, UA: NONE SEEN
Bilirubin Urine: NEGATIVE
Glucose, UA: NEGATIVE mg/dL
Ketones, ur: NEGATIVE mg/dL
Leukocytes,Ua: NEGATIVE
Nitrite: NEGATIVE
Protein, ur: NEGATIVE mg/dL
Specific Gravity, Urine: 1.004 — ABNORMAL LOW (ref 1.005–1.030)
pH: 5 (ref 5.0–8.0)

## 2023-10-27 LAB — HCG, SERUM, QUALITATIVE: Preg, Serum: NEGATIVE

## 2023-10-27 LAB — LIPASE, BLOOD: Lipase: 35 U/L (ref 11–51)

## 2023-10-27 MED ORDER — DICLOFENAC SODIUM 75 MG PO TBEC: 75.0000 mg | DELAYED_RELEASE_TABLET | Freq: Two times a day (BID) | ORAL | 0 refills | Status: DC

## 2023-10-27 MED ORDER — CYCLOBENZAPRINE HCL 10 MG PO TABS: 10.0000 mg | ORAL_TABLET | Freq: Two times a day (BID) | ORAL | 0 refills | Status: DC | PRN

## 2023-10-27 MED ORDER — IOHEXOL 300 MG/ML  SOLN
100.0000 mL | Freq: Once | INTRAMUSCULAR | Status: AC | PRN
  Administered 2023-10-27: 100 mL via INTRAVENOUS

## 2023-10-27 MED ORDER — LORAZEPAM 2 MG/ML IJ SOLN
0.5000 mg | Freq: Once | INTRAMUSCULAR | Status: AC
  Administered 2023-10-27: 0.5 mg via INTRAVENOUS
  Filled 2023-10-27: qty 1

## 2023-10-27 MED ORDER — METHOCARBAMOL 750 MG PO TABS
750.0000 mg | ORAL_TABLET | Freq: Three times a day (TID) | ORAL | 0 refills | Status: DC
Start: 2023-10-27 — End: 2023-10-27

## 2023-10-27 NOTE — ED Provider Notes (Signed)
 [place] [facility]ERGENCY DEPARTMENT AT [facility] Provider Note   CSN: [id number] Arrival date & time: 10/27/23  3086     History  Chief Complaint  Patient presents with   Abdominal Pain    [name] is a 38 y.o. female.  Patient is a 38 year old female who presents to the emergency department the chief complaint of periumbilical abdominal pain with radiation to the right side of her abdomen which has been ongoing since yesterday.  She notes that the pain is worse with any attempted movement and was straining her abdominal muscles.  She notes that she does have a history of previous laparoscopic surgeries for her ovaries but denies any other abdominal surgeries.  She denies any nausea, vomiting, diarrhea.  She has had no associated dysuria or hematuria.  She denies any recent falls or blunt abdominal wall trauma.   Abdominal Pain      Home Medications Prior to Admission medications   Medication Sig Start Date End Date Taking? Authorizing Provider  amphetamine-dextroamphetamine (ADDERALL) 20 MG tablet Take 20 mg by mouth daily.     [provider]  atomoxetine (STRATTERA) 60 MG capsule Take 60 mg by mouth every morning. 09/17/23   [provider]  azithromycin  (ZITHROMAX ) 250 MG tablet Take (2) tablets by mouth on day 1, then take (1) tablet by mouth on days 2-5. 10/13/23   Wilhemena Harbour, NP  benzonatate  (TESSALON ) 100 MG capsule Take 1 capsule (100 mg total) by mouth 3 (three) times daily as needed for cough. Do not take with alcohol or while operating or driving heavy machinery 5/78/46   Wilhemena Harbour, NP  buPROPion (WELLBUTRIN SR) 100 MG 12 hr tablet Wellbutrin SR 100 mg tablet, 12 hr sustained-release  Take 1 tablet every day by oral route. Patient not taking: No sig reported    [provider]  chlorhexidine  (PERIDEX ) 0.12 % solution Use as directed 15 mLs in the mouth or throat 2 (two) times daily. 04/06/22   Dorenda Gandy,  MD  esomeprazole  (NEXIUM ) 40 MG packet Take 40 mg by mouth daily. 03/26/22   [provider]  famotidine  (PEPCID ) 20 MG tablet Take 1 tablet (20 mg total) by mouth 2 (two) times daily. Patient not taking: Reported on 04/18/2020 12/16/18   Zackowski, Scott, MD  fluconazole  (DIFLUCAN ) 150 MG tablet Take one tablet on the day you fill the prescription. If you continue to have symptoms then take the second tablet in 3 days. 07/15/23   Couture, Cortni S, PA-C  fluticasone  (FLONASE ) 50 MCG/ACT nasal spray Place 2 sprays into both nostrils daily. 11/10/22   Leath-Warren, Belen Bowers, NP  Fremanezumab -vfrm (AJOVY ) 225 MG/1.5ML SOSY Inject 225 mg into the skin every 28 (twenty-eight) days. Patient not taking: Reported on 04/02/2022 10/31/20   Merriam Abbey, DO  loratadine  (CLARITIN ) 10 MG tablet Take 1 tablet (10 mg total) by mouth daily. 07/15/23 08/14/23  Couture, Cortni S, PA-C  Ubrogepant  (UBRELVY ) 100 MG TABS Take 1 tablet by mouth as needed (May repeat in 2 hours. Maximum 2 tablets in 24 hours). Patient not taking: Reported on 04/02/2022 12/20/20   Merriam Abbey, DO      Allergies    Ciprofloxacin, Dilaudid  [hydromorphone  hcl], Morphine , Morphine  sulfate, Other, Adhesive [tape], and Morphine  and codeine     Review of Systems   Review of Systems  Gastrointestinal:  Positive for abdominal pain.  All other systems reviewed and are negative.   Physical Exam Updated Vital Signs BP  138/81 (BP Location: Left Arm)   Pulse 88   Temp 98.2 F (36.8 C) (Oral)   Resp 17   Ht 5\' 7"  (1.702 m)   Wt 102.1 kg   LMP 08/18/2012   SpO2 100%   BMI 35.24 kg/m  Physical Exam Vitals and nursing note reviewed.  Constitutional:      Appearance: Normal appearance.  HENT:     Head: Normocephalic and atraumatic.     Nose: Nose normal.     Mouth/Throat:     Mouth: Mucous membranes are moist.  Eyes:     Extraocular Movements: Extraocular movements intact.     Conjunctiva/sclera: Conjunctivae normal.      Pupils: Pupils are equal, round, and reactive to light.  Cardiovascular:     Rate and Rhythm: Normal rate and regular rhythm.     Pulses: Normal pulses.     Heart sounds: Normal heart sounds.  Pulmonary:     Effort: Pulmonary effort is normal.     Breath sounds: Normal breath sounds.  Abdominal:     General: Abdomen is flat. Bowel sounds are normal. There is no distension. There are no signs of injury.     Palpations: Abdomen is soft. There is no mass.     Tenderness: There is abdominal tenderness in the periumbilical area. There is no guarding. Negative signs include Murphy's sign and McBurney's sign.     Hernia: No hernia is present.  Musculoskeletal:        General: Normal range of motion.     Cervical back: Normal range of motion and neck supple.  Skin:    General: Skin is warm and dry.     Findings: No rash.  Neurological:     General: No focal deficit present.     Mental Status: She is alert and oriented to person, place, and time. Mental status is at baseline.  Psychiatric:        Mood and Affect: Mood normal.        Behavior: Behavior normal.        Thought Content: Thought content normal.        Judgment: Judgment normal.     ED Results / Procedures / Treatments   Labs (all labs ordered are listed, but only abnormal results are displayed) Labs Reviewed  CBC - Abnormal; Notable for the following components:      Result Value   Hemoglobin 15.6 (*)    HCT 46.3 (*)    All other components within normal limits  URINALYSIS, ROUTINE W REFLEX MICROSCOPIC - Abnormal; Notable for the following components:   Color, Urine STRAW (*)    APPearance HAZY (*)    Specific Gravity, Urine 1.004 (*)    Hgb urine dipstick SMALL (*)    All other components within normal limits  LIPASE, BLOOD  COMPREHENSIVE METABOLIC PANEL WITH GFR  HCG, SERUM, QUALITATIVE    EKG None  Radiology No results found.  Procedures Procedures    Medications Ordered in ED Medications  LORazepam  (ATIVAN) injection 0.5 mg (0.5 mg Intravenous Given 10/27/23 0901)    ED Course/ Medical Decision Making/ A&P                                 Medical Decision Making Amount and/or Complexity of Data Reviewed Labs: ordered. Radiology: ordered.  Risk Prescription drug management.   This patient presents to the ED for concern of abdominal  pain differential diagnosis includes abdominal wall pain, acute appendicitis, cholecystitis, spinal traction, diverticulitis, ovarian torsion or cyst, PID, to brain abscess, pyelonephritis, kidney stone, hernia, pancreatitis, mesenteric ischemia    Additional history obtained:  Additional history obtained from none External records from outside source obtained and reviewed including none   Lab Tests:  I Ordered, and personally interpreted labs.  The pertinent results include: No leukocytosis, no anemia, normal electrolytes, normal kidney function liver function, unremarkable urinalysis, normal lipase   Imaging Studies ordered:  I ordered imaging studies including CT scan of abdomen and pelvis I independently visualized and interpreted imaging which showed no acute surgical process, cyst to right adrenal gland, left ovarian cyst, cholelithiasis I agree with the radiologist interpretation   Medicines ordered and prescription drug management:  I ordered medication including Voltaren, Robaxin  for abdominal wall pain Reevaluation of the patient after these medicines showed that the patient improved I have reviewed the patients home medicines and have made adjustments as needed   Problem List / ED Course:  Patient is doing well at this time and is stable for discharge home.  Discussed with patient that workup in the emergency department has been overall unremarkable.  Patient does have a history of ovarian cyst and do not suspect ovarian torsion at this point.  She is nontender palpation in the lower abdomen.  Patient does have a gallstone but  do not suspect acute cholecystitis at this point.  She has no indication for acute cholangitis, choledocholithiasis.  Patient was made aware of the lesion to the right adrenal gland and provided with a copy of her CT scan report.  Discussed the importance of close follow-up with her primary care doctor for this.  Suspect that she does have a strain to her abdominal wall muscles at this point and will continue symptomatic treatment on outpatient basis.  Strict turn precautions were discussed for any new or worsening symptoms.  Patient voiced understanding and had no additional questions.   Social Determinants of Health:  None           Final Clinical Impression(s) / ED Diagnoses Final diagnoses:  None    Rx / DC Orders ED Discharge Orders     None         Emmalene Hare 10/27/23 1232    Dorenda Gandy, MD 10/28/23 1312

## 2023-10-27 NOTE — ED Triage Notes (Signed)
 Pt c/o pain at mid abdomen with radiation to right side, c/o pain with touching, using abdominal muscles, denies n/v/d/fever/urinary symptoms

## 2023-10-27 NOTE — Discharge Instructions (Signed)
 Please follow-up closely with your primary care doctor for reevaluation and for continued workup of the adrenal gland lesion.  Return to emergency department immediately for any new or worsening symptoms.

## 2023-10-29 ENCOUNTER — Other Ambulatory Visit: Payer: Self-pay | Admitting: Family Medicine

## 2023-10-29 DIAGNOSIS — E279 Disorder of adrenal gland, unspecified: Secondary | ICD-10-CM

## 2023-10-30 ENCOUNTER — Encounter: Payer: Self-pay | Admitting: Family Medicine

## 2023-11-04 ENCOUNTER — Ambulatory Visit
Admission: RE | Admit: 2023-11-04 | Discharge: 2023-11-04 | Disposition: A | Discharge status: Home / Self Care | Source: Ambulatory Visit | Attending: Family Medicine | Admitting: Family Medicine

## 2023-11-04 DIAGNOSIS — E279 Disorder of adrenal gland, unspecified: Secondary | ICD-10-CM

## 2023-11-04 MED ORDER — IOPAMIDOL (ISOVUE-300) INJECTION 61%
100.0000 mL | Freq: Once | INTRAVENOUS | Status: AC | PRN
  Administered 2023-11-04: 100 mL via INTRAVENOUS

## 2023-11-09 NOTE — Progress Notes (Unsigned)
 [name] [id number] April 13, 1986   Chief Complaint:  Referring Provider: Sun, Vyvyan, MD Primary GI MD: Para Bold  HPI: [name] is a 38 y.o. female with past medical history of acute appendicitis, Chiari malformation, PTSD, panic attacks, anxiety, endometriosis, GERD who presents today for a complaint of *** .    Patient seen at [facility] ED 10/27/2023 for complaint of periumbilical abdominal pain with radiation to the right side of her abdomen ongoing for 1 day.  Pain noted to be worse with movement and patient has been straining her abdominal muscles.  Reported history of previous laparoscopic surgeries and ovarian cyst.  CT A/P revealed right adrenal gland lesion and cholelithiasis without cholecystitis.  Labs unremarkable.  Her symptoms were thought to be due to a strain of her abdominal wall muscles.  Previous GI Procedures/Imaging   CT A/P w/wo contrast 11/04/2023 (for adrenal nodule) The lung bases are clear. The heart is normal in size. The liver contains a few small hemangiomas similar to prior. The gallbladder contains a calcified stone. The spleen is normal. The pancreas is normal. Left adrenal is normal. There is a similar 2.1 cm right adrenal adenoma.   The right kidney is normal. Left renal cysts are seen. Small nonobstructing left intrarenal stones are noted. The abdominal aorta is normal in caliber. Scattered atherosclerotic changes are present. Visualized loops of large and small bowel appear within normal limits. No free fluid or adenopathy. Bones are normal.  CT A/P w/ contrast 10/27/2023 1.  No acute intra-abdominal or pelvic abnormality. 2. Trace free fluid in the pelvis, likely physiologic in a female of this age. Dominant follicle in the left ovary measuring 2 cm. 3. Interval development of a right adrenal nodule measuring 2 cm (axial 23). While this is indeterminate, in the absence of known malignancy, this likely represents a benign  adenoma. A nonemergent multiphase CT or MRI of the abdomen (adrenal protocol) with IV contrast is recommended for further characterization. 4. Cholecystolithiasis.  Barium esophagram 04/06/2019 1. Normal single contrast esophagram.   EGD 04/02/2019 (Dr. Denece Finger, [facility]) - Normal mucosa was found in the entire esophagus.  Biopsied - Normal stomach - Normal examined duodenum - Dysphagia without obvious cause found on endoscopy Path: Squamous mucosa with reflux type changes.  Negative for eosinophilic esophagitis.  Negative for intestinal metaplasia and fungal organisms.  Barium esophagram 01/11/2019 1. Nonspecific esophageal motility disorder. 2. Gastroesophageal reflux.   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   started antibiotic 06/20/2015   Panic attacks    PTSD (post-traumatic stress disorder)    Shoulder dislocation 06/2015   right    Past Surgical History:  Procedure Laterality Date  BRAIN SURGERY     CESAREAN SECTION  09/05/2008   CYSTOSCOPY N/A 09/15/2012   Procedure: CYSTOSCOPY;  Surgeon: Jeanmarie Millet, MD;  Location: [facility];  Service: Gynecology;  Laterality: N/A;   DILATION AND EVACUATION  04/15/2007   HYSTEROSCOPY WITH D & C  03/31/2012   Procedure: DILATATION AND CURETTAGE  /HYSTEROSCOPY;  Surgeon: Jeanmarie Millet, MD;  Location: [facility];  Service: Gynecology;  Laterality: N/A;   LAPAROSCOPIC APPENDECTOMY N/A 10/29/2016   Procedure: APPENDECTOMY LAPAROSCOPIC;  Surgeon: Alanda Allegra, MD;  Location: [facility];  Service: General;  Laterality: N/A;   LAPAROSCOPIC ASSISTED VAGINAL HYSTERECTOMY N/A 09/15/2012   Procedure: LAPAROSCOPIC ASSISTED VAGINAL HYSTERECTOMY;  Surgeon: Jeanmarie Millet, MD;  Location: [facility];  Service: Gynecology;  Laterality: N/A;   LAPAROSCOPY  03/31/2012   Procedure: LAPAROSCOPY OPERATIVE;  Surgeon: Jeanmarie Millet, MD;  Location: [facility];  Service: Gynecology;  Laterality: N/A;  with Biopsies of Bilateral Fallopian Tubes; Fulguration of Endometriosis   SHOULDER ARTHROSCOPY WITH CAPSULORRHAPHY Right 06/28/2015   Procedure: RIGHT SHOULDER ARTHROSCOPY WITH CAPSULORRHAPHY;  Surgeon: Marlena Sima, MD;  Location: [facility];  Service: Orthopedics;  Laterality: Right;   SUBOCCIPITAL CRANIECTOMY CERVICAL LAMINECTOMY N/A 08/08/2016   Procedure: SUBOCCIPITAL CRANIECTOMY CERVICAL LAMINECTOMY NUMBER DURAPLASTY;  Surgeon: Garry Kansas, MD;  Location: [facility] OR;  Service: Neurosurgery;  Laterality: N/A;  suboccipital   ULNAR NERVE REPAIR Right 05/2015    Current Outpatient Medications  Medication Sig Dispense Refill   amphetamine-dextroamphetamine (ADDERALL) 20 MG tablet Take 20 mg by mouth daily.      atomoxetine (STRATTERA) 60 MG capsule Take 60 mg by mouth every morning.     azithromycin  (ZITHROMAX ) 250 MG tablet Take (2) tablets by mouth on day 1, then take (1) tablet by mouth on days 2-5. 6 tablet 0   benzonatate  (TESSALON ) 100 MG capsule Take 1 capsule (100 mg total) by mouth 3 (three) times daily as needed for cough. Do not take with alcohol or while operating or driving heavy machinery 21 capsule 0   buPROPion (WELLBUTRIN SR) 100 MG 12 hr tablet Wellbutrin SR 100 mg tablet, 12 hr sustained-release  Take 1 tablet every day by oral route.  (Patient not taking: No sig reported)     chlorhexidine  (PERIDEX ) 0.12 % solution Use as directed 15 mLs in the mouth or throat 2 (two) times daily. 120 mL 0   cyclobenzaprine  (FLEXERIL ) 10 MG tablet Take 1 tablet (10 mg total) by mouth 2 (two) times daily as needed for muscle spasms. 20 tablet 0   diclofenac  (VOLTAREN ) 75 MG EC tablet Take 1 tablet (75 mg total) by mouth 2 (two) times daily. 20 tablet 0   esomeprazole  (NEXIUM ) 40 MG packet Take 40 mg by mouth daily.     famotidine  (PEPCID ) 20 MG tablet Take 1 tablet (20 mg total) by mouth 2 (two) times daily. (Patient not taking: Reported on 04/18/2020) 30 tablet 0   fluconazole  (DIFLUCAN ) 150 MG tablet Take one tablet on the day you fill the prescription. If you continue to have symptoms then take the second tablet in 3 days. 2 tablet 0   fluticasone  (FLONASE ) 50 MCG/ACT nasal spray Place 2 sprays into both nostrils daily. 16 g 0   Fremanezumab -vfrm (AJOVY ) 225 MG/1.5ML SOSY Inject 225 mg into the skin every 28 (twenty-eight) days. (Patient not taking: Reported on 04/02/2022) 1.68 mL 5   loratadine  (CLARITIN ) 10 MG tablet Take 1 tablet (10 mg total) by mouth  daily. 30 tablet 0   Ubrogepant  (UBRELVY ) 100 MG TABS Take 1 tablet by mouth as needed (May repeat in 2 hours. Maximum 2 tablets in 24 hours). (Patient not taking: Reported on 04/02/2022) 16 tablet 5   No current facility-administered medications for this visit.    Allergies as of 11/10/2023 - Review Complete 11/04/2023  Allergen Reaction Noted   Ciprofloxacin Shortness Of Breath 09/27/2020   Dilaudid  [hydromorphone  hcl] Shortness Of Breath 09/01/2012   Methocarbamol  Other (See Comments) 10/27/2023   Morphine   03/01/2020   Morphine  sulfate Other (See Comments) 09/20/2020   Other Other (See Comments) 09/20/2020   Adhesive [tape] Rash 06/20/2015   Morphine  and codeine  Itching and Nausea And Vomiting 08/01/2016    Family History  Problem Relation Age of Onset   Anesthesia problems  Father        hard to wake up post-op   Stroke Father    Varicose Veins Mother    Varicose Veins Maternal Grandmother     Social History   Tobacco Use   Smoking status: Every Day    Current packs/day: 1.00    Average packs/day: 1 pack/day for 10.0 years (10.0 ttl pk-yrs)    Types: Cigarettes   Smokeless tobacco: Never   Tobacco comments:    quit 2 months ago  Vaping Use   Vaping status: Never Used  Substance Use Topics   Alcohol use: Yes    Comment: occasionally    Drug use: No     Review of Systems:    Constitutional: No weight loss, fever, chills, weakness or fatigue HEENT: Eyes: No change in vision Ears, Nose, Throat:  No change in hearing or congestion Skin: No rash or itching Cardiovascular: No chest pain, chest pressure or palpitations   Respiratory: No SOB or cough Gastrointestinal: See HPI and otherwise negative Genitourinary: No dysuria or change in urinary frequency Neurological: No headache, dizziness or syncope Musculoskeletal: No new muscle or joint pain Hematologic: No bleeding or bruising    Physical Exam:  Vital signs: LMP 08/18/2012   Constitutional: NAD, Well developed, Well nourished, alert and cooperative Head:  Normocephalic and atraumatic.  Eyes: No scleral icterus. Conjunctiva pink. Mouth: No oral lesions. Respiratory: Respirations even and unlabored. Lungs clear to auscultation bilaterally.  No wheezes, crackles, or rhonchi.  Cardiovascular:  Regular rate and rhythm. No murmurs. No peripheral edema. Gastrointestinal:  Soft, nondistended, nontender. No rebound or guarding. Normal bowel sounds. No appreciable masses or hepatomegaly. Rectal:  Not performed.  Msk:  Symmetrical without gross deformities. Without edema, no deformity or joint abnormality.  Neurologic:  Alert and oriented x4;  grossly normal neurologically.  Skin:   Dry and intact without significant lesions or rashes. Psychiatric: Oriented to person, place and time. Demonstrates  good judgement and reason without abnormal affect or behaviors.   RELEVANT LABS AND IMAGING: CBC    Component Value Date/Time   WBC 7.7 10/27/2023 0858   RBC 4.83 10/27/2023 0858   HGB 15.6 (H) 10/27/2023 0858   HCT 46.3 (H) 10/27/2023 0858   PLT 256 10/27/2023 0858   MCV 95.9 10/27/2023 0858   MCH 32.3 10/27/2023 0858   MCHC 33.7 10/27/2023 0858   RDW 12.7 10/27/2023 0858   LYMPHSABS 2.9 08/19/2021 2357   MONOABS 0.4 08/19/2021 2357   EOSABS 0.1 08/19/2021 2357   BASOSABS 0.0 08/19/2021 2357    CMP     Component Value Date/Time   NA 136 10/27/2023 0858   K 4.2 10/27/2023 0858   CL  106 10/27/2023 0858   CO2 22 10/27/2023 0858   GLUCOSE 98 10/27/2023 0858   BUN 10 10/27/2023 0858   CREATININE 0.71 10/27/2023 0858   CALCIUM  9.1 10/27/2023 0858   PROT 7.0 10/27/2023 0858   ALBUMIN 3.6 10/27/2023 0858   AST 18 10/27/2023 0858   ALT 20 10/27/2023 0858   ALKPHOS 57 10/27/2023 0858   BILITOT 0.5 10/27/2023 0858   GFRNONAA >60 10/27/2023 0858   GFRAA >60 04/10/2019 0819     Assessment/Plan:       Valiant Gaul, PA-C [facility] 11/09/2023, 5:21 PM  Patient Care Team: Sun, Vyvyan, MD as PCP - General (Family Medicine)

## 2023-11-10 ENCOUNTER — Ambulatory Visit (INDEPENDENT_AMBULATORY_CARE_PROVIDER_SITE_OTHER): Admitting: Gastroenterology

## 2023-11-10 ENCOUNTER — Other Ambulatory Visit (INDEPENDENT_AMBULATORY_CARE_PROVIDER_SITE_OTHER)

## 2023-11-10 ENCOUNTER — Encounter: Payer: Self-pay | Admitting: Gastroenterology

## 2023-11-10 VITALS — BP 128/78 | HR 93 | Ht 67.0 in | Wt 229.3 lb

## 2023-11-10 DIAGNOSIS — K219 Gastro-esophageal reflux disease without esophagitis: Secondary | ICD-10-CM | POA: Diagnosis not present

## 2023-11-10 DIAGNOSIS — R935 Abnormal findings on diagnostic imaging of other abdominal regions, including retroperitoneum: Secondary | ICD-10-CM

## 2023-11-10 DIAGNOSIS — R1031 Right lower quadrant pain: Secondary | ICD-10-CM | POA: Diagnosis not present

## 2023-11-10 DIAGNOSIS — R110 Nausea: Secondary | ICD-10-CM

## 2023-11-10 DIAGNOSIS — K8010 Calculus of gallbladder with chronic cholecystitis without obstruction: Secondary | ICD-10-CM | POA: Diagnosis not present

## 2023-11-10 DIAGNOSIS — R1011 Right upper quadrant pain: Secondary | ICD-10-CM

## 2023-11-10 LAB — CBC WITH DIFFERENTIAL/PLATELET
Basophils Absolute: 0.1 10*3/uL (ref 0.0–0.1)
Basophils Relative: 0.5 % (ref 0.0–3.0)
Eosinophils Absolute: 0 10*3/uL (ref 0.0–0.7)
Eosinophils Relative: 0.4 % (ref 0.0–5.0)
HCT: 45.7 % (ref 36.0–46.0)
Hemoglobin: 15.7 g/dL — ABNORMAL HIGH (ref 12.0–15.0)
Lymphocytes Relative: 20.5 % (ref 12.0–46.0)
Lymphs Abs: 2.1 10*3/uL (ref 0.7–4.0)
MCHC: 34.4 g/dL (ref 30.0–36.0)
MCV: 96.7 fl (ref 78.0–100.0)
Monocytes Absolute: 0.2 10*3/uL (ref 0.1–1.0)
Monocytes Relative: 2.4 % — ABNORMAL LOW (ref 3.0–12.0)
Neutro Abs: 8 10*3/uL — ABNORMAL HIGH (ref 1.4–7.7)
Neutrophils Relative %: 76.2 % (ref 43.0–77.0)
Platelets: 265 10*3/uL (ref 150.0–400.0)
RBC: 4.73 Mil/uL (ref 3.87–5.11)
RDW: 13.5 % (ref 11.5–15.5)
WBC: 10.5 10*3/uL (ref 4.0–10.5)

## 2023-11-10 LAB — COMPREHENSIVE METABOLIC PANEL WITH GFR
ALT: 9 U/L (ref 0–35)
AST: 10 U/L (ref 0–37)
Albumin: 4.4 g/dL (ref 3.5–5.2)
Alkaline Phosphatase: 54 U/L (ref 39–117)
BUN: 10 mg/dL (ref 6–23)
CO2: 26 meq/L (ref 19–32)
Calcium: 9 mg/dL (ref 8.4–10.5)
Chloride: 104 meq/L (ref 96–112)
Creatinine, Ser: 0.73 mg/dL (ref 0.40–1.20)
GFR: 103.83 mL/min (ref >60.00)
Glucose, Bld: 84 mg/dL (ref 70–99)
Potassium: 3.8 meq/L (ref 3.5–5.1)
Sodium: 138 meq/L (ref 135–145)
Total Bilirubin: 0.5 mg/dL (ref 0.2–1.2)
Total Protein: 7.2 g/dL (ref 6.0–8.3)

## 2023-11-10 LAB — LIPASE: Lipase: 11 U/L (ref 11.0–59.0)

## 2023-11-10 MED ORDER — ESOMEPRAZOLE MAGNESIUM 40 MG PO PACK: 40.0000 mg | PACK | Freq: Every day | ORAL | 2 refills | Status: AC

## 2023-11-10 MED ORDER — ONDANSETRON HCL 4 MG PO TABS: 4.0000 mg | ORAL_TABLET | Freq: Three times a day (TID) | ORAL | 0 refills | Status: AC | PRN

## 2023-11-10 NOTE — Patient Instructions (Addendum)
 We have sent the following medications to your pharmacy for you to pick up at your convenience: Zofran  and Nexium   Your provider has requested that you go to the basement level for lab work before leaving today. Press "B" on the elevator. The lab is located at the first door on the left as you exit the elevator.   We have placed a referral to [facility] and their office will call you to schedule an appointment.  _______________________________________________________  If your blood pressure at your visit was 140/90 or greater, please contact your primary care physician to follow up on this.  _______________________________________________________  If you are age 73 or older, your body mass index should be between 23-30. Your Body mass index is 35.91 kg/m. If this is out of the aforementioned range listed, please consider follow up with your Primary Care Provider.  If you are age 39 or younger, your body mass index should be between 19-25. Your Body mass index is 35.91 kg/m. If this is out of the aformentioned range listed, please consider follow up with your Primary Care Provider.   ________________________________________________________  The [facility] providers would like to encourage you to use MYCHART to communicate with providers for non-urgent requests or questions.  Due to long hold times on the telephone, sending your provider a message by [facility] may be a faster and more efficient way to get a response.  Please allow 48 business hours for a response.  Please remember that this is for non-urgent requests.  _______________________________________________________

## 2023-11-14 ENCOUNTER — Ambulatory Visit: Payer: Self-pay | Admitting: Surgery

## 2023-11-14 NOTE — H&P (Signed)
 [name] [id number]   Referring Provider:  Driscilla George, *   Subjective   Chief Complaint: New Problem (calculus of gallbladder w/cholcysitis)     History of Present Illness: Very pleasant 38 year old woman with history of anxiety, cervicalgia, Chiari I malformation, depression, endometriosis, migraines, panic attacks, PTSD, obesity, and GERD who presents for consultation regarding biliary colic.  She was seen at the emergency department at [facility] on 4/28 with periumbilical pain radiating to the right side of her abdomen for 1 day, aggravated with movement.  She had CT imaging as below and was empirically diagnosed with a strain of her abdominal wall muscles.  She was also treated for possible kidney infection reportedly and her periumbilical pain did resolve after this but she has continued to have right sided abdominal pain.  She was evaluated by [facility] 4 days ago for the same where she reported a 2 to 3-week history of right lower quadrant abdominal pain immediately after eating which radiates up to her right upper quadrant and right side of her back to her shoulder blade.  Ranges from cramp sensation to more sharp and shooting pain associated with nausea and recently emesis.  Previous abdominal surgery-C-section, laparoscopic appendectomy, laparoscopic-assisted vaginal hysterectomy, diagnostic laparoscopy  Labs 11/10/2023-normal CMP, normal CBC  CT scan 10/27/2023 and 11/04/2023: 2.1 cm right adrenal adenoma,, hepatic cyst versus hemangioma, 1.5 cm splenic cyst, nonobstructive left interpolar renal calculus calcified stone in the gallbladder  Review of Systems: A complete review of systems was obtained from the patient.  I have reviewed this information and discussed as appropriate with the patient.  See HPI as well for other ROS.   Medical History: Past Medical History:  Diagnosis Date   Anxiety    Asthma, unspecified asthma severity, unspecified whether  complicated, unspecified whether persistent (HHS-HCC)    GERD (gastroesophageal reflux disease)     There is no problem list on file for this patient.   Past Surgical History:  Procedure Laterality Date   Ulnar nerve repair (Right)  05/2015   Shoulder arthroscopy with capsulorrhaphy (Right)  06/28/2015   Suboccipital craniectomy cervical laminectomy  08/08/2016   APPENDECTOMY  10/29/2016   brain surgery     CESAREAN SECTION     HYSTERECTOMY       Allergies  Allergen Reactions   Ciprofloxacin Other (See Comments) and Shortness Of Breath   Morphine  Itching and Nausea And Vomiting    Have to give benadryl  with morphine , also nausea/vomiting    Current Outpatient Medications on File Prior to Visit  Medication Sig Dispense Refill   diclofenac  (VOLTAREN ) 75 MG EC tablet Take 75 mg by mouth 2 (two) times daily     esomeprazole  (NEXIUM ) 40 MG DR capsule daily     ondansetron  (ZOFRAN ) 4 MG tablet Take 4 mg by mouth     propranoloL  (INDERAL ) 10 MG tablet Take 10 mg by mouth 2 (two) times daily     valACYclovir (VALTREX) 1000 MG tablet Take 1,000 mg by mouth every 12 (twelve) hours     VYVANSE 30 mg capsule Take 30 mg by mouth once daily     dextroamphetamine-amphetamine (ADDERALL) 20 mg tablet 20 mg twice a day by oral route.     No current facility-administered medications on file prior to visit.    Family History  Problem Relation Age of Onset   Obesity Mother    High blood pressure (Hypertension) Mother    Hyperlipidemia (Elevated cholesterol) Mother    Obesity  Father    Coronary Artery Disease (Blocked arteries around heart) Father    Diabetes Father    Stroke Father      Social History   Tobacco Use  Smoking Status Every Day   Current packs/day: 1.00   Types: Cigarettes  Smokeless Tobacco Never     Social History   Socioeconomic History   Marital status: Single  Tobacco Use   Smoking status: Every Day    Current packs/day: 1.00    Types: Cigarettes    Smokeless tobacco: Never  Substance and Sexual Activity   Alcohol use: Not Currently    Objective:    Vitals:   11/14/23 1044  BP: 117/82  Pulse: 85  Temp: 37.1 C (98.7 F)  SpO2: 99%  Weight: (!) 105.4 kg (232 lb 6.4 oz)  Height: 170.2 cm (5\' 7" )  PainSc:   8  PainLoc: Abdomen    Body mass index is 36.4 kg/m.  Gen: A&Ox3, no distress  Chest: respiratory effort is normal. Abdomen: soft, nondistended, tender in the epigastrium and right upper quadrant without peritoneal signs Neuro: no gross deficit Psych: appropriate mood and affect, normal insight/judgment intact  Skin: warm and dry    Assessment and Plan:  Diagnoses and all orders for this visit:  Right upper quadrant abdominal pain  Cholecystitis    I recommend proceeding with laparoscopic cholecystectomy with possible cholangiogram.  Discussed the relevant anatomy using a diagram to demonstrate, and went over surgical technique.  Discussed risks of surgery including bleeding, infection, pain, scarring, intraabdominal injury specifically to the common bile duct and sequelae, subtotal cholecystectomy, bile leak, conversion to open surgery, failure to resolve symptoms, post-cholecystectomy diarrhea which is typically self-limited, blood clots/ pulmonary embolus, heart attack, pneumonia, stroke, etc. we also reviewed signs and symptoms that should prompt her to seek more emergent treatment.  Questions were welcomed and answered to patient's satisfaction.  Patient wishes to proceed with surgery, will have her meet with our schedulers and see if we can get this on the schedule as soon as possible given ongoing symptoms.    Aldon Hung MD FACS

## 2023-11-14 NOTE — H&P (View-Only) (Signed)
 [name] [id number]   Referring Provider:  Driscilla George, *   Subjective   Chief Complaint: New Problem (calculus of gallbladder w/cholcysitis)     History of Present Illness: Very pleasant 38 year old woman with history of anxiety, cervicalgia, Chiari I malformation, depression, endometriosis, migraines, panic attacks, PTSD, obesity, and GERD who presents for consultation regarding biliary colic.  She was seen at the emergency department at [facility] on 4/28 with periumbilical pain radiating to the right side of her abdomen for 1 day, aggravated with movement.  She had CT imaging as below and was empirically diagnosed with a strain of her abdominal wall muscles.  She was also treated for possible kidney infection reportedly and her periumbilical pain did resolve after this but she has continued to have right sided abdominal pain.  She was evaluated by [facility] 4 days ago for the same where she reported a 2 to 3-week history of right lower quadrant abdominal pain immediately after eating which radiates up to her right upper quadrant and right side of her back to her shoulder blade.  Ranges from cramp sensation to more sharp and shooting pain associated with nausea and recently emesis.  Previous abdominal surgery-C-section, laparoscopic appendectomy, laparoscopic-assisted vaginal hysterectomy, diagnostic laparoscopy  Labs 11/10/2023-normal CMP, normal CBC  CT scan 10/27/2023 and 11/04/2023: 2.1 cm right adrenal adenoma,, hepatic cyst versus hemangioma, 1.5 cm splenic cyst, nonobstructive left interpolar renal calculus calcified stone in the gallbladder  Review of Systems: A complete review of systems was obtained from the patient.  I have reviewed this information and discussed as appropriate with the patient.  See HPI as well for other ROS.   Medical History: Past Medical History:  Diagnosis Date   Anxiety    Asthma, unspecified asthma severity, unspecified whether  complicated, unspecified whether persistent (HHS-HCC)    GERD (gastroesophageal reflux disease)     There is no problem list on file for this patient.   Past Surgical History:  Procedure Laterality Date   Ulnar nerve repair (Right)  05/2015   Shoulder arthroscopy with capsulorrhaphy (Right)  06/28/2015   Suboccipital craniectomy cervical laminectomy  08/08/2016   APPENDECTOMY  10/29/2016   brain surgery     CESAREAN SECTION     HYSTERECTOMY       Allergies  Allergen Reactions   Ciprofloxacin Other (See Comments) and Shortness Of Breath   Morphine  Itching and Nausea And Vomiting    Have to give benadryl  with morphine , also nausea/vomiting    Current Outpatient Medications on File Prior to Visit  Medication Sig Dispense Refill   diclofenac  (VOLTAREN ) 75 MG EC tablet Take 75 mg by mouth 2 (two) times daily     esomeprazole  (NEXIUM ) 40 MG DR capsule daily     ondansetron  (ZOFRAN ) 4 MG tablet Take 4 mg by mouth     propranoloL  (INDERAL ) 10 MG tablet Take 10 mg by mouth 2 (two) times daily     valACYclovir (VALTREX) 1000 MG tablet Take 1,000 mg by mouth every 12 (twelve) hours     VYVANSE 30 mg capsule Take 30 mg by mouth once daily     dextroamphetamine-amphetamine (ADDERALL) 20 mg tablet 20 mg twice a day by oral route.     No current facility-administered medications on file prior to visit.    Family History  Problem Relation Age of Onset   Obesity Mother    High blood pressure (Hypertension) Mother    Hyperlipidemia (Elevated cholesterol) Mother    Obesity  Father    Coronary Artery Disease (Blocked arteries around heart) Father    Diabetes Father    Stroke Father      Social History   Tobacco Use  Smoking Status Every Day   Current packs/day: 1.00   Types: Cigarettes  Smokeless Tobacco Never     Social History   Socioeconomic History   Marital status: Single  Tobacco Use   Smoking status: Every Day    Current packs/day: 1.00    Types: Cigarettes    Smokeless tobacco: Never  Substance and Sexual Activity   Alcohol use: Not Currently    Objective:    Vitals:   11/14/23 1044  BP: 117/82  Pulse: 85  Temp: 37.1 C (98.7 F)  SpO2: 99%  Weight: (!) 105.4 kg (232 lb 6.4 oz)  Height: 170.2 cm (5\' 7" )  PainSc:   8  PainLoc: Abdomen    Body mass index is 36.4 kg/m.  Gen: A&Ox3, no distress  Chest: respiratory effort is normal. Abdomen: soft, nondistended, tender in the epigastrium and right upper quadrant without peritoneal signs Neuro: no gross deficit Psych: appropriate mood and affect, normal insight/judgment intact  Skin: warm and dry    Assessment and Plan:  Diagnoses and all orders for this visit:  Right upper quadrant abdominal pain  Cholecystitis    I recommend proceeding with laparoscopic cholecystectomy with possible cholangiogram.  Discussed the relevant anatomy using a diagram to demonstrate, and went over surgical technique.  Discussed risks of surgery including bleeding, infection, pain, scarring, intraabdominal injury specifically to the common bile duct and sequelae, subtotal cholecystectomy, bile leak, conversion to open surgery, failure to resolve symptoms, post-cholecystectomy diarrhea which is typically self-limited, blood clots/ pulmonary embolus, heart attack, pneumonia, stroke, etc. we also reviewed signs and symptoms that should prompt her to seek more emergent treatment.  Questions were welcomed and answered to patient's satisfaction.  Patient wishes to proceed with surgery, will have her meet with our schedulers and see if we can get this on the schedule as soon as possible given ongoing symptoms.    Aldon Hung MD FACS

## 2023-11-16 NOTE — Patient Instructions (Addendum)
 SURGICAL WAITING ROOM VISITATION Patients having surgery or a procedure may have no more than 2 support people in the waiting area - these visitors may rotate in the visitor waiting room.   If the patient needs to stay at the hospital during part of their recovery, the visitor guidelines for inpatient rooms apply.  PRE-OP VISITATION  Pre-op nurse will coordinate an appropriate time for 1 support person to accompany the patient in pre-op.  This support person may not rotate.  This visitor will be contacted when the time is appropriate for the visitor to come back in the pre-op area.  Please refer to the [facility] website for the visitor guidelines for Inpatients (after your surgery is over and you are in a regular room).  You are not required to quarantine at this time prior to your surgery. However, you must do this: Hand Hygiene often Do NOT share personal items Notify your provider if you are in close contact with someone who has COVID or you develop fever 100.4 or greater, new onset of sneezing, cough, sore throat, shortness of breath or body aches.  If you test positive for Covid or have been in contact with anyone that has tested positive in the last 10 days please notify you surgeon.    Your procedure is scheduled on:  TUESDAY  Nov 18, 2023  Report to [facility] Main Entrance: [address] entrance where the [employer] is available.   Report to admitting at: 05:15    AM  Call this number if you have any questions or problems the morning of surgery [phone]  DO NOT EAT OR DRINK ANYTHING AFTER MIDNIGHT THE NIGHT PRIOR TO YOUR SURGERY / PROCEDURE.   FOLLOW  ANY ADDITIONAL PRE OP INSTRUCTIONS YOU RECEIVED FROM YOUR SURGEON'S OFFICE!!!   Oral Hygiene is also important to reduce your risk of infection.        Remember - BRUSH YOUR TEETH THE MORNING OF SURGERY WITH YOUR REGULAR TOOTHPASTE  Do NOT smoke after Midnight the night before surgery.  STOP TAKING all Vitamins,  Herbs and supplements 1 week before your surgery.   Take ONLY these medicines the morning of surgery with A SIP OF WATER : esomeprazole .                    You may not have any metal on your body including hair pins, jewelry, and body piercing  Do not wear make-up, lotions, powders, perfumes or deodorant  Do not wear nail polish including gel and S&S, artificial / acrylic nails, or any other type of covering on natural nails including finger and toenails. If you have artificial nails, gel coating, etc., that needs to be removed by a nail salon, Please have this removed prior to surgery. Not doing so may mean that your surgery could be cancelled or delayed if the Surgeon or anesthesia staff feels like they are unable to monitor you safely.   Do not shave 48 hours prior to surgery to avoid nicks in your skin which may contribute to postoperative infections.   Contacts, Hearing Aids, dentures or bridgework may not be worn into surgery. DENTURES WILL BE REMOVED PRIOR TO SURGERY PLEASE DO NOT APPLY "Poly grip" OR ADHESIVES!!!  Patients discharged on the day of surgery will not be allowed to drive home.  Someone NEEDS to stay with you for the first 24 hours after anesthesia.  Do not bring your home medications to the hospital. The Pharmacy will dispense medications listed  on your medication list to you during your admission in the Hospital.  Please read over the following fact sheets you were given: IF YOU HAVE QUESTIONS ABOUT YOUR PRE-OP INSTRUCTIONS, PLEASE CALL [phone].   [facility] - Preparing for Surgery Before surgery, you can play an important role.  Because skin is not sterile, your skin needs to be as free of germs as possible.  You can reduce the number of germs on your skin by washing with CHG (chlorahexidine gluconate) soap before surgery.  CHG is an antiseptic cleaner which kills germs and bonds with the skin to continue killing germs even after washing. Please DO NOT use if you  have an allergy to CHG or antibacterial soaps.  If your skin becomes reddened/irritated stop using the CHG and inform your nurse when you arrive at Short Stay. Do not shave (including legs and underarms) for at least 48 hours prior to the first CHG shower.  You may shave your face/neck.  Please follow these instructions carefully:  1.  Shower with CHG Soap the night before surgery and the  morning of surgery.  2.  If you choose to wash your hair, wash your hair first as usual with your normal  shampoo.  3.  After you shampoo, rinse your hair and body thoroughly to remove the shampoo.                             4.  Use CHG as you would any other liquid soap.  You can apply chg directly to the skin and wash.  Gently with a scrungie or clean washcloth.  5.  Apply the CHG Soap to your body ONLY FROM THE NECK DOWN.   Do not use on face/ open                           Wound or open sores. Avoid contact with eyes, ears mouth and genitals (private parts).                       Wash face,  Genitals (private parts) with your normal soap.             6.  Wash thoroughly, paying special attention to the area where your  surgery  will be performed.  7.  Thoroughly rinse your body with warm water  from the neck down.  8.  DO NOT shower/wash with your normal soap after using and rinsing off the CHG Soap.            9.  Pat yourself dry with a clean towel.            10.  Wear clean pajamas.            11.  Place clean sheets on your bed the night of your first shower and do not  sleep with pets.  ON THE DAY OF SURGERY : Do not apply any lotions/deodorants the morning of surgery.  Please wear clean clothes to the hospital/surgery center.    FAILURE TO FOLLOW THESE INSTRUCTIONS MAY RESULT IN THE CANCELLATION OF YOUR SURGERY  PATIENT SIGNATURE_________________________________  NURSE SIGNATURE__________________________________  ________________________________________________________________________

## 2023-11-16 NOTE — Progress Notes (Addendum)
 COVID Vaccine received:  [x]  No []  Yes Date of any COVID positive Test in last 90 days: none  PCP - Vyvyan Sun, MD at [facility]  [phone] Cardiologist - none  Chest x-ray - 08-18-2022  2v  Epic EKG -  08-20-2021 Epic  will repeat Stress Test -  ECHO -  Cardiac Cath -   Comments: Seen at [facility] on 10-27-23 and [facility] on 11-10-23; she had bloodwork at these visits including CMP and CBC diff. These labs were used for this preop.   Bowel Prep - [x]  No  []   Yes ______  Pacemaker / ICD device [x]  No []  Yes   Spinal Cord Stimulator:[x]  No []  Yes       History of Sleep Apnea? [x]  No []  Yes   CPAP used?- [x]  No []  Yes    Does the patient monitor blood sugar?   [x]  N/A   []  No []  Yes  Patient has: [x]  NO Hx DM   []  Pre-DM   []  DM1  []   DM2  Blood Thinner / Instructions: none Aspirin Instructions:   none  ERAS Protocol Ordered: [x]  No  []  Yes Patient is to be NPO after: MN  Dental hx: []  Dentures:  [x]  N/A      []  Bridge or Partial:                   []  Loose or Damaged teeth:   Activity level: Patient is able to climb a flight of stairs without difficulty; [x]  No CP but would have SOB Patient can  perform ADLs without assistance.   Anesthesia review: s/p suboccipital craniectomy for Chiari I malformation 2018, anxiety / depression- PTSD, GERD, migraines, Smoker, hard to wake in previous surgery.   Patient denies shortness of breath, fever, cough and chest pain at PAT appointment.  Patient verbalized understanding and agreement to the Pre-Surgical Instructions that were given to them at this PAT appointment. Patient was also educated of the need to review these PAT instructions again prior to her surgery.I reviewed the appropriate phone numbers to call if they have any and questions or concerns.

## 2023-11-17 ENCOUNTER — Other Ambulatory Visit: Payer: Self-pay

## 2023-11-17 ENCOUNTER — Encounter (HOSPITAL_COMMUNITY): Payer: Self-pay

## 2023-11-17 ENCOUNTER — Encounter (HOSPITAL_COMMUNITY)
Admission: RE | Admit: 2023-11-17 | Discharge: 2023-11-17 | Disposition: A | Discharge status: Home / Self Care | Source: Ambulatory Visit | Attending: Surgery | Admitting: Surgery

## 2023-11-17 VITALS — BP 124/64 | HR 68 | Temp 98.7°F | Resp 16 | Ht 67.0 in | Wt 229.0 lb

## 2023-11-17 DIAGNOSIS — R9431 Abnormal electrocardiogram [ECG] [EKG]: Secondary | ICD-10-CM | POA: Diagnosis not present

## 2023-11-17 DIAGNOSIS — Z8614 Personal history of Methicillin resistant Staphylococcus aureus infection: Secondary | ICD-10-CM | POA: Insufficient documentation

## 2023-11-17 DIAGNOSIS — Z01818 Encounter for other preprocedural examination: Secondary | ICD-10-CM | POA: Diagnosis not present

## 2023-11-17 DIAGNOSIS — Z01810 Encounter for preprocedural cardiovascular examination: Secondary | ICD-10-CM | POA: Diagnosis present

## 2023-11-17 DIAGNOSIS — Z01812 Encounter for preprocedural laboratory examination: Secondary | ICD-10-CM | POA: Diagnosis present

## 2023-11-17 HISTORY — DX: Pneumonia, unspecified organism: J18.9

## 2023-11-17 HISTORY — DX: Personal history of urinary calculi: Z87.442

## 2023-11-17 HISTORY — DX: Gastro-esophageal reflux disease without esophagitis: K21.9

## 2023-11-17 LAB — SURGICAL PCR SCREEN
MRSA, PCR: NEGATIVE
Staphylococcus aureus: NEGATIVE

## 2023-11-17 NOTE — Anesthesia Preprocedure Evaluation (Addendum)
 Anesthesia Evaluation  Patient identified by MRN, date of birth, ID band Patient awake    Reviewed: Allergy & Precautions, NPO status , Patient's Chart, lab work & pertinent test results  History of Anesthesia Complications Negative for: history of anesthetic complications  Airway Mallampati: II  TM Distance: >3 FB Neck ROM: Full    Dental  (+) Missing, Chipped, Dental Advisory Given   Pulmonary Current Smoker and Patient abstained from smoking.   breath sounds clear to auscultation       Cardiovascular negative cardio ROS  Rhythm:Regular Rate:Normal     Neuro/Psych  Headaches  Anxiety Depression    Chiari 1: suboccipital craniectomy    GI/Hepatic Neg liver ROS,GERD  Medicated and Controlled,,  Endo/Other  BMI 36  Renal/GU negative Renal ROS     Musculoskeletal   Abdominal   Peds  Hematology Hb 15.7, plt 265k   Anesthesia Other Findings   Reproductive/Obstetrics                             Anesthesia Physical Anesthesia Plan  ASA: 3  Anesthesia Plan: General   Post-op Pain Management: Tylenol  PO (pre-op)*   Induction: Intravenous  PONV Risk Score and Plan: 2 and Dexamethasone  and Ondansetron   Airway Management Planned: Oral ETT  Additional Equipment: None  Intra-op Plan:   Post-operative Plan: Extubation in OR  Informed Consent: I have reviewed the patients History and Physical, chart, labs and discussed the procedure including the risks, benefits and alternatives for the proposed anesthesia with the patient or authorized representative who has indicated his/her understanding and acceptance.     Dental advisory given  Plan Discussed with: CRNA and Surgeon  Anesthesia Plan Comments:         Anesthesia Quick Evaluation

## 2023-11-18 ENCOUNTER — Encounter (HOSPITAL_COMMUNITY): Payer: Self-pay | Admitting: Surgery

## 2023-11-18 ENCOUNTER — Other Ambulatory Visit: Payer: Self-pay

## 2023-11-18 ENCOUNTER — Ambulatory Visit (HOSPITAL_COMMUNITY): Payer: Self-pay | Admitting: Physician Assistant

## 2023-11-18 ENCOUNTER — Ambulatory Visit (HOSPITAL_COMMUNITY)
Admission: RE | Admit: 2023-11-18 | Discharge: 2023-11-18 | Disposition: A | Discharge status: Home / Self Care | Attending: Surgery | Admitting: Surgery

## 2023-11-18 ENCOUNTER — Ambulatory Visit (HOSPITAL_BASED_OUTPATIENT_CLINIC_OR_DEPARTMENT_OTHER): Payer: Self-pay | Admitting: Anesthesiology

## 2023-11-18 ENCOUNTER — Encounter (HOSPITAL_COMMUNITY)
Admission: RE | Disposition: A | Discharge status: Home / Self Care | Payer: Self-pay | Source: Home / Self Care | Attending: Surgery

## 2023-11-18 DIAGNOSIS — F17210 Nicotine dependence, cigarettes, uncomplicated: Secondary | ICD-10-CM | POA: Insufficient documentation

## 2023-11-18 DIAGNOSIS — J45909 Unspecified asthma, uncomplicated: Secondary | ICD-10-CM | POA: Diagnosis not present

## 2023-11-18 DIAGNOSIS — R1011 Right upper quadrant pain: Secondary | ICD-10-CM | POA: Diagnosis present

## 2023-11-18 DIAGNOSIS — F418 Other specified anxiety disorders: Secondary | ICD-10-CM | POA: Diagnosis not present

## 2023-11-18 DIAGNOSIS — D3501 Benign neoplasm of right adrenal gland: Secondary | ICD-10-CM | POA: Diagnosis not present

## 2023-11-18 DIAGNOSIS — K219 Gastro-esophageal reflux disease without esophagitis: Secondary | ICD-10-CM | POA: Insufficient documentation

## 2023-11-18 DIAGNOSIS — K819 Cholecystitis, unspecified: Secondary | ICD-10-CM

## 2023-11-18 DIAGNOSIS — E669 Obesity, unspecified: Secondary | ICD-10-CM | POA: Insufficient documentation

## 2023-11-18 DIAGNOSIS — K8010 Calculus of gallbladder with chronic cholecystitis without obstruction: Secondary | ICD-10-CM | POA: Diagnosis not present

## 2023-11-18 DIAGNOSIS — Z6836 Body mass index (BMI) 36.0-36.9, adult: Secondary | ICD-10-CM | POA: Diagnosis not present

## 2023-11-18 HISTORY — PX: CHOLECYSTECTOMY: SHX55

## 2023-11-18 SURGERY — LAPAROSCOPIC CHOLECYSTECTOMY WITH INTRAOPERATIVE CHOLANGIOGRAM
Anesthesia: General

## 2023-11-18 MED ORDER — GABAPENTIN 300 MG PO CAPS
300.0000 mg | ORAL_CAPSULE | ORAL | Status: AC
  Administered 2023-11-18: 300 mg via ORAL
  Filled 2023-11-18: qty 1

## 2023-11-18 MED ORDER — FENTANYL CITRATE (PF) 100 MCG/2ML IJ SOLN
INTRAMUSCULAR | Status: DC | PRN
Start: 2023-11-18 — End: 2023-11-18
  Administered 2023-11-18: 100 ug via INTRAVENOUS

## 2023-11-18 MED ORDER — SUGAMMADEX SODIUM 200 MG/2ML IV SOLN
INTRAVENOUS | Status: DC | PRN
Start: 2023-11-18 — End: 2023-11-18
  Administered 2023-11-18: 200 mg via INTRAVENOUS

## 2023-11-18 MED ORDER — MIDAZOLAM HCL 2 MG/2ML IJ SOLN
INTRAMUSCULAR | Status: AC
  Filled 2023-11-18: qty 2

## 2023-11-18 MED ORDER — CHLORHEXIDINE GLUCONATE 4 % EX SOLN: 60.0000 mL | Freq: Once | CUTANEOUS | Status: DC

## 2023-11-18 MED ORDER — ROCURONIUM BROMIDE 10 MG/ML (PF) SYRINGE
PREFILLED_SYRINGE | INTRAVENOUS | Status: AC
  Filled 2023-11-18: qty 10

## 2023-11-18 MED ORDER — ONDANSETRON HCL 4 MG/2ML IJ SOLN
INTRAMUSCULAR | Status: DC | PRN
  Administered 2023-11-18: 4 mg via INTRAVENOUS

## 2023-11-18 MED ORDER — MIDAZOLAM HCL 5 MG/5ML IJ SOLN
INTRAMUSCULAR | Status: DC | PRN
Start: 2023-11-18 — End: 2023-11-18
  Administered 2023-11-18: 2 mg via INTRAVENOUS

## 2023-11-18 MED ORDER — HYDROMORPHONE HCL 2 MG/ML IJ SOLN
INTRAMUSCULAR | Status: AC
  Filled 2023-11-18: qty 1

## 2023-11-18 MED ORDER — CEFAZOLIN SODIUM-DEXTROSE 2-4 GM/100ML-% IV SOLN
2.0000 g | INTRAVENOUS | Status: AC
  Administered 2023-11-18: 2 g via INTRAVENOUS
  Filled 2023-11-18: qty 100

## 2023-11-18 MED ORDER — CHLORHEXIDINE GLUCONATE 0.12 % MT SOLN: 15.0000 mL | Freq: Once | OROMUCOSAL | Status: DC

## 2023-11-18 MED ORDER — OXYCODONE HCL 5 MG/5ML PO SOLN: 5.0000 mg | Freq: Once | ORAL | Status: AC | PRN

## 2023-11-18 MED ORDER — FENTANYL CITRATE PF 50 MCG/ML IJ SOSY
25.0000 ug | PREFILLED_SYRINGE | INTRAMUSCULAR | Status: DC | PRN
  Administered 2023-11-18 (×2): 25 ug via INTRAVENOUS

## 2023-11-18 MED ORDER — OXYCODONE HCL 5 MG PO TABS
ORAL_TABLET | ORAL | Status: AC
  Filled 2023-11-18: qty 1

## 2023-11-18 MED ORDER — MIDAZOLAM HCL 2 MG/2ML IJ SOLN: 0.5000 mg | Freq: Once | INTRAMUSCULAR | Status: DC | PRN

## 2023-11-18 MED ORDER — DOCUSATE SODIUM 100 MG PO CAPS: 100.0000 mg | ORAL_CAPSULE | Freq: Two times a day (BID) | ORAL | 0 refills | Status: AC

## 2023-11-18 MED ORDER — ACETAMINOPHEN 500 MG PO TABS: 1000.0000 mg | ORAL_TABLET | Freq: Once | ORAL | Status: DC

## 2023-11-18 MED ORDER — DEXAMETHASONE SODIUM PHOSPHATE 10 MG/ML IJ SOLN
INTRAMUSCULAR | Status: DC | PRN
Start: 2023-11-18 — End: 2023-11-18
  Administered 2023-11-18: 10 mg via INTRAVENOUS

## 2023-11-18 MED ORDER — BUPIVACAINE LIPOSOME 1.3 % IJ SUSP
INTRAMUSCULAR | Status: DC | PRN
  Administered 2023-11-18: 20 mL

## 2023-11-18 MED ORDER — TRAMADOL HCL 50 MG PO TABS: 50.0000 mg | ORAL_TABLET | Freq: Four times a day (QID) | ORAL | 0 refills | Status: AC | PRN

## 2023-11-18 MED ORDER — MEPERIDINE HCL 50 MG/ML IJ SOLN: 6.2500 mg | INTRAMUSCULAR | Status: DC | PRN

## 2023-11-18 MED ORDER — LACTATED RINGERS IV SOLN: INTRAVENOUS | Status: DC

## 2023-11-18 MED ORDER — ACETAMINOPHEN 325 MG PO TABS: 650.0000 mg | ORAL_TABLET | ORAL | Status: DC | PRN

## 2023-11-18 MED ORDER — FENTANYL CITRATE PF 50 MCG/ML IJ SOSY
PREFILLED_SYRINGE | INTRAMUSCULAR | Status: AC
  Filled 2023-11-18: qty 1

## 2023-11-18 MED ORDER — PHENYLEPHRINE 80 MCG/ML (10ML) SYRINGE FOR IV PUSH (FOR BLOOD PRESSURE SUPPORT)
PREFILLED_SYRINGE | INTRAVENOUS | Status: DC | PRN
  Administered 2023-11-18 (×2): 80 ug via INTRAVENOUS

## 2023-11-18 MED ORDER — ONDANSETRON HCL 4 MG/2ML IJ SOLN
INTRAMUSCULAR | Status: AC
  Filled 2023-11-18: qty 2

## 2023-11-18 MED ORDER — OXYCODONE HCL 5 MG PO TABS
5.0000 mg | ORAL_TABLET | Freq: Once | ORAL | Status: AC | PRN
  Administered 2023-11-18: 5 mg via ORAL

## 2023-11-18 MED ORDER — FENTANYL CITRATE (PF) 100 MCG/2ML IJ SOLN
INTRAMUSCULAR | Status: AC
Start: 2023-11-18 — End: ?
  Filled 2023-11-18: qty 2

## 2023-11-18 MED ORDER — ROCURONIUM BROMIDE 100 MG/10ML IV SOLN
INTRAVENOUS | Status: DC | PRN
  Administered 2023-11-18: 60 mg via INTRAVENOUS

## 2023-11-18 MED ORDER — ORAL CARE MOUTH RINSE: 15.0000 mL | Freq: Once | OROMUCOSAL | Status: DC

## 2023-11-18 MED ORDER — BUPIVACAINE-EPINEPHRINE 0.25% -1:200000 IJ SOLN
INTRAMUSCULAR | Status: DC | PRN
  Administered 2023-11-18: 30 mL

## 2023-11-18 MED ORDER — LIDOCAINE HCL (PF) 2 % IJ SOLN
INTRAMUSCULAR | Status: AC
  Filled 2023-11-18: qty 5

## 2023-11-18 MED ORDER — DEXAMETHASONE SODIUM PHOSPHATE 10 MG/ML IJ SOLN
INTRAMUSCULAR | Status: AC
  Filled 2023-11-18: qty 1

## 2023-11-18 MED ORDER — BUPIVACAINE LIPOSOME 1.3 % IJ SUSP: 20.0000 mL | Freq: Once | INTRAMUSCULAR | Status: DC

## 2023-11-18 MED ORDER — INDOCYANINE GREEN 25 MG IV SOLR
1.2500 mg | Freq: Once | INTRAVENOUS | Status: AC
  Administered 2023-11-18: 25 mg via INTRAVENOUS
  Filled 2023-11-18: qty 10

## 2023-11-18 MED ORDER — OXYCODONE HCL 5 MG PO TABS: 5.0000 mg | ORAL_TABLET | ORAL | Status: DC | PRN

## 2023-11-18 MED ORDER — BUPIVACAINE-EPINEPHRINE (PF) 0.25% -1:200000 IJ SOLN
INTRAMUSCULAR | Status: AC
  Filled 2023-11-18: qty 30

## 2023-11-18 MED ORDER — 0.9 % SODIUM CHLORIDE (POUR BTL) OPTIME
TOPICAL | Status: DC | PRN
  Administered 2023-11-18: 1000 mL

## 2023-11-18 MED ORDER — LIDOCAINE HCL (CARDIAC) PF 100 MG/5ML IV SOSY
PREFILLED_SYRINGE | INTRAVENOUS | Status: DC | PRN
  Administered 2023-11-18: 40 mg via INTRATRACHEAL

## 2023-11-18 MED ORDER — SCOPOLAMINE 1 MG/3DAYS TD PT72
1.0000 | MEDICATED_PATCH | TRANSDERMAL | Status: DC
  Administered 2023-11-18: 1.5 mg via TRANSDERMAL
  Filled 2023-11-18: qty 1

## 2023-11-18 MED ORDER — PROPOFOL 10 MG/ML IV BOLUS
INTRAVENOUS | Status: DC | PRN
  Administered 2023-11-18: 200 mg via INTRAVENOUS

## 2023-11-18 MED ORDER — ACETAMINOPHEN 500 MG PO TABS
1000.0000 mg | ORAL_TABLET | ORAL | Status: AC
  Administered 2023-11-18: 1000 mg via ORAL
  Filled 2023-11-18: qty 2

## 2023-11-18 MED ORDER — BUPIVACAINE LIPOSOME 1.3 % IJ SUSP
INTRAMUSCULAR | Status: AC
  Filled 2023-11-18: qty 20

## 2023-11-18 MED ORDER — HYDROMORPHONE HCL 1 MG/ML IJ SOLN: 0.2500 mg | INTRAMUSCULAR | Status: DC | PRN

## 2023-11-18 MED ORDER — HYDROMORPHONE HCL 1 MG/ML IJ SOLN
INTRAMUSCULAR | Status: DC | PRN
  Administered 2023-11-18: 1 mg via INTRAVENOUS

## 2023-11-18 MED ORDER — ACETAMINOPHEN 650 MG RE SUPP: 650.0000 mg | RECTAL | Status: DC | PRN

## 2023-11-18 SURGICAL SUPPLY — 35 items
BAG COUNTER SPONGE SURGICOUNT (BAG) ×1 IMPLANT
BENZOIN TINCTURE PRP APPL 2/3 (GAUZE/BANDAGES/DRESSINGS) IMPLANT
BNDG ADH 1X3 SHEER STRL LF (GAUZE/BANDAGES/DRESSINGS) IMPLANT
CABLE HIGH FREQUENCY MONO STRZ (ELECTRODE) ×1 IMPLANT
CHLORAPREP W/TINT 26 (MISCELLANEOUS) ×1 IMPLANT
CLIP APPLIE ROT 10 11.4 M/L (STAPLE) ×1 IMPLANT
COVER MAYO STAND XLG (MISCELLANEOUS) IMPLANT
COVER SURGICAL LIGHT HANDLE (MISCELLANEOUS) ×1 IMPLANT
DERMABOND ADVANCED .7 DNX12 (GAUZE/BANDAGES/DRESSINGS) IMPLANT
DRAPE C-ARM 42X120 X-RAY (DRAPES) IMPLANT
ELECT REM PT RETURN 15FT ADLT (MISCELLANEOUS) ×1 IMPLANT
ENDOLOOP SUT PDS II 0 18 (SUTURE) IMPLANT
GLOVE BIO SURGEON STRL SZ 6 (GLOVE) ×1 IMPLANT
GLOVE INDICATOR 6.5 STRL GRN (GLOVE) ×1 IMPLANT
GOWN STRL REUS W/ TWL LRG LVL3 (GOWN DISPOSABLE) ×1 IMPLANT
GRASPER SUT TROCAR 14GX15 (MISCELLANEOUS) ×1 IMPLANT
HEMOSTAT SNOW SURGICEL 2X4 (HEMOSTASIS) IMPLANT
IRRIGATION SUCT STRKRFLW 2 WTP (MISCELLANEOUS) ×1 IMPLANT
KIT BASIN OR (CUSTOM PROCEDURE TRAY) ×1 IMPLANT
KIT TURNOVER KIT A (KITS) IMPLANT
NDL INSUFFLATION 14GA 120MM (NEEDLE) ×1 IMPLANT
NEEDLE INSUFFLATION 14GA 120MM (NEEDLE) ×1 IMPLANT
SCISSORS LAP 5X35 DISP (ENDOMECHANICALS) ×1 IMPLANT
SET CHOLANGIOGRAPH MIX (MISCELLANEOUS) IMPLANT
SET TUBE SMOKE EVAC HIGH FLOW (TUBING) ×1 IMPLANT
SLEEVE Z-THREAD 5X100MM (TROCAR) ×1 IMPLANT
SPIKE FLUID TRANSFER (MISCELLANEOUS) ×1 IMPLANT
STRIP CLOSURE SKIN 1/2X4 (GAUZE/BANDAGES/DRESSINGS) IMPLANT
SUT MNCRL AB 4-0 PS2 18 (SUTURE) ×1 IMPLANT
SYSTEM BAG RETRIEVAL 10MM (BASKET) IMPLANT
TOWEL OR 17X26 10 PK STRL BLUE (TOWEL DISPOSABLE) ×1 IMPLANT
TRAY LAPAROSCOPIC (CUSTOM PROCEDURE TRAY) ×1 IMPLANT
TROCAR ADV FIXATION 12X100MM (TROCAR) ×1 IMPLANT
TROCAR XCEL NON-BLD 5MMX100MML (ENDOMECHANICALS) IMPLANT
TROCAR Z-THREAD OPTICAL 5X100M (TROCAR) ×1 IMPLANT

## 2023-11-18 NOTE — Anesthesia Procedure Notes (Signed)
 Procedure Name: Intubation Date/Time: 11/18/2023 7:38 AM  Performed by: Maryanna Smart, CRNAPre-anesthesia Checklist: Patient identified, Emergency Drugs available, Suction available, Patient being monitored and Timeout performed Patient Re-evaluated:Patient Re-evaluated prior to induction Oxygen Delivery Method: Circle system utilized Preoxygenation: Pre-oxygenation with 100% oxygen Induction Type: IV induction Ventilation: Mask ventilation without difficulty Laryngoscope Size: Miller and 2 Grade View: Grade I Tube type: Oral Tube size: 7.0 mm Number of attempts: 1 Airway Equipment and Method: Stylet Placement Confirmation: positive ETCO2, ETT inserted through vocal cords under direct vision, CO2 detector and breath sounds checked- equal and bilateral Secured at: 22 cm Tube secured with: Tape Dental Injury: Teeth and Oropharynx as per pre-operative assessment

## 2023-11-18 NOTE — Transfer of Care (Signed)
 Immediate Anesthesia Transfer of Care Note  Patient: [name]  Procedure(s) Performed: LAPAROSCOPIC CHOLECYSTECTOMY  Patient Location: PACU  Anesthesia Type:MAC and General  Level of Consciousness: drowsy and patient cooperative  Airway & Oxygen Therapy: Patient Spontanous Breathing and Patient connected to face mask oxygen  Post-op Assessment: Report given to RN and Post -op Vital signs reviewed and stable  Post vital signs: Reviewed and stable  Last Vitals:  Vitals Value Taken Time  BP 137/75 11/18/23 0831  Temp    Pulse 77 11/18/23 0833  Resp 15 11/18/23 0833  SpO2 99 % 11/18/23 0833  Vitals shown include unfiled device data.  Last Pain:  Vitals:   11/18/23 0545  TempSrc: Oral  PainSc: 3       Patients Stated Pain Goal: 2 (11/18/23 0545)  Complications: No notable events documented.

## 2023-11-18 NOTE — Discharge Instructions (Signed)
LAPAROSCOPIC SURGERY: POST OP INSTRUCTIONS   EAT Gradually transition to a high fiber diet with a fiber supplement over the next few weeks after discharge.   WALK Walk an hour a day (cumulative- not all at once).  Control your pain to do that.    CONTROL PAIN Control pain so that you can walk, sleep, tolerate sneezing/coughing, go up/down stairs.  HAVE A BOWEL MOVEMENT DAILY Keep your bowels regular to avoid problems.  OK to try a laxative to override constipation.  OK to use an antidiarrheal to slow down diarrhea.  Call if not better after 2 tries  CALL IF YOU HAVE PROBLEMS/CONCERNS Call if you are still struggling despite following these instructions. Call if you have concerns not answered by these instructions    DIET: Follow a light bland diet & liquids the first 24 hours after arrival home, such as soup, liquids, starches, etc.  Be sure to drink plenty of fluids.  Quickly advance to a usual solid diet within a few days.  Avoid fast food or heavy meals initially as you are more likely to get nauseated or have irregular bowels.  A low-sugar, high-fiber diet for the rest of your life is ideal.  Take your usually prescribed home medications unless otherwise directed.  PAIN CONTROL: Pain is best controlled by a usual combination of three different methods TOGETHER: Ice/Heat Over the counter pain medication Prescription pain medication Most patients will experience some swelling and bruising around the incisions.  Ice packs or heating pads (30-60 minutes up to 6 times a day) will help. Use ice for the first few days to help decrease swelling and bruising, then switch to heat to help relax tight/sore spots and speed recovery.  Some people prefer to use ice alone, heat alone, alternating between ice & heat.  Experiment to what works for you.  Swelling and bruising can take several weeks to resolve.   It is helpful to take an over-the-counter pain medication regularly for the first few  days: Naproxen (Aleve, etc)  Two 220mg tabs twice a day OR Ibuprofen (Advil, etc) Three 200mg tabs four times a day (every meal & bedtime) AND Acetaminophen (Tylenol, etc) 500-650mg four times a day (every meal & bedtime) A  prescription for pain medication (such as oxycodone, hydrocodone, tramadol, gabapentin, methocarbamol, etc) should be given to you upon discharge.  Take your pain medication as prescribed, IF NEEDED.  If you are having problems/concerns with the prescription medicine (does not control pain, nausea, vomiting, rash, itching, etc), please call us [phone] to see if we need to switch you to a different pain medicine that will work better for you and/or control your side effect better. If you need a refill on your pain medication, please give us 48 hour notice.  contact your pharmacy.  They will contact our office to request authorization. Prescriptions will not be filled after 5 pm or on week-ends  Avoid getting constipated.   Between the surgery and the pain medications, it is common to experience some constipation.   Increasing fluid intake and taking a fiber supplement (such as Metamucil, Citrucel, FiberCon, MiraLax, etc) 1-2 times a day regularly will usually help prevent this problem from occurring.   A mild laxative (prune juice, Milk of Magnesia, MiraLax, etc) should be taken according to package directions if there are no bowel movements after 48 hours.   Watch out for diarrhea.   If you have many loose bowel movements, simplify your diet to bland foods &   liquids for a few days.   Stop any stool softeners and decrease your fiber supplement.   Switching to mild anti-diarrheal medications (Kayopectate, Pepto Bismol) can help.   If this worsens or does not improve, please call us.  Wash / shower every day.  You may shower over the skin glue which is waterproof.  Do not soak or submerge incisions.  No rubbing, scrubbing, lotions or ointments to incisions.  Glue will  flake off after about 2 weeks.  You may leave the incision open to air.  You may replace a dressing/Band-Aid to cover the incision for comfort if you wish.   ACTIVITIES as tolerated:   You may resume regular (light) daily activities beginning the next day--such as daily self-care, walking, climbing stairs--gradually increasing activities as tolerated.  If you can walk 30 minutes without difficulty, it is safe to try more intense activity such as jogging, treadmill, bicycling, low-impact aerobics, swimming, etc. Save the most intensive and strenuous activity for last such as sit-ups, heavy lifting, contact sports, etc  Refrain from any heavy lifting or straining until you are off narcotics for pain control.   DO NOT PUSH THROUGH PAIN.  Let pain be your guide: If it hurts to do something, don't do it.  Pain is your body warning you to avoid that activity for another week until the pain goes down. You may drive when you are no longer taking prescription pain medication, you can comfortably wear a seatbelt, and you can safely maneuver your car and apply brakes. You may have sexual intercourse when it is comfortable.  FOLLOW UP in our office Please call [facility] at [phone] to set up an appointment to see your surgeon in the office for a follow-up appointment approximately 2-3 weeks after your surgery. Make sure that you call for this appointment the day you arrive home to insure a convenient appointment time.  10. IF YOU HAVE DISABILITY OR FAMILY LEAVE FORMS, BRING THEM TO THE OFFICE FOR PROCESSING.  DO NOT GIVE THEM TO YOUR DOCTOR.   WHEN TO CALL US [phone]: Poor pain control Reactions / problems with new medications (rash/itching, nausea, etc)  Fever over 101.5 F (38.5 C) Inability to urinate Nausea and/or vomiting Worsening swelling or bruising Continued bleeding from incision. Increased pain, redness, or drainage from the incision   The clinic staff is available to answer your  questions during regular business hours (8:30am-5pm).  Please don't hesitate to call and ask to speak to one of our nurses for clinical concerns.   If you have a medical emergency, go to the nearest emergency room or call 911.  A surgeon from [facility] is always on call at the hospitals   [facility] [address], [address], [place], [place]  [address] ? MAIN: [phone] ? TOLL FREE: [phone] ?  FAX [phone] [email or web address]  

## 2023-11-18 NOTE — Anesthesia Postprocedure Evaluation (Signed)
 Anesthesia Post Note  Patient: [name]  Procedure(s) Performed: LAPAROSCOPIC CHOLECYSTECTOMY     Patient location during evaluation: PACU Anesthesia Type: General Level of consciousness: awake and alert, patient cooperative and oriented Pain management: pain level controlled Vital Signs Assessment: post-procedure vital signs reviewed and stable Respiratory status: spontaneous breathing, nonlabored ventilation and respiratory function stable Cardiovascular status: blood pressure returned to baseline and stable Postop Assessment: no apparent nausea or vomiting, adequate PO intake and able to ambulate Anesthetic complications: no   No notable events documented.  Last Vitals:  Vitals:   11/18/23 0900 11/18/23 0915  BP: 114/76 125/83  Pulse: 81 77  Resp: 14 13  Temp:    SpO2: 100% 97%    Last Pain:  Vitals:   11/18/23 0915  TempSrc:   PainSc: 3                  [name],E. Kalan Rinn

## 2023-11-18 NOTE — Interval H&P Note (Signed)
 History and Physical Interval Note:  11/18/2023 6:54 AM  [name]  has presented today for surgery, with the diagnosis of CHOLECYSTITIS.  The various methods of treatment have been discussed with the patient and family. After consideration of risks, benefits and other options for treatment, the patient has consented to  Procedure(s) with comments: LAPAROSCOPIC CHOLECYSTECTOMY WITH INTRAOPERATIVE CHOLANGIOGRAM (N/A) - LAP CHOLE WITH ICG DYE as a surgical intervention.  The patient's history has been reviewed, patient examined, no change in status, stable for surgery.  I have reviewed the patient's chart and labs.  Questions were answered to the patient's satisfaction.     Earnstine Meinders Loyola Rummage

## 2023-11-18 NOTE — Op Note (Signed)
 Operative Note  MONE COMMISSO 38 y.o. female [id number]  11/18/2023  Surgeon: Adalberto Acton MD FACS  Procedure performed: Laparoscopic Cholecystectomy with near-infrared fluorescent cholangiography  Preop diagnosis: cholecystitis Post-op diagnosis/intraop findings: same  Specimens: gallbladder  Retained items: none  EBL: minimal  Complications: none  Description of procedure: After confirmng informed consent the patient was brought to the operating room. Antibiotics were administered. SCD's were applied. General endotracheal anesthesia was initiated and a formal time-out was performed. The abdomen was prepped and draped in the usual sterile fashion and the abdomen was entered using a left subcostal veress needle after instilling the site with local. Insufflation to was obtained, 5mm trocar and camera inserted using optical entry in the right upper quadrant, and gross inspection revealed no evidence of injury from our entry or other intraabdominal abnormalities. No adhesions to the anterior abdominal wall. Two 5mm trocars were introduced in the supraumbilical and right anterior axillary lines under direct visualization and following infiltration with local. A 12mm trocar was placed in the epigastrium. The gallbladder fundus was retracted cephalad and the infundibulum was retracted laterally. A combination of hook electrocautery and blunt dissection was utilized to clear the peritoneum from the neck and cystic duct, circumferentially isolating the cystic artery and cystic duct and lifting the gallbladder from the cystic plate. The critical view of safety was achieved with the cystic artery, cystic duct, and liver bed visualized between them with no other structures. Near-infrared fluorescent cholangiography was activated however the tracer had not had time to fill the biliary tree at this point.  The cystic artery was clipped with a single clip proximally and distally and divided as  was the cystic duct with three clips on the proximal end. The gallbladder was dissected from the liver plate using electrocautery. Once freed the gallbladder was placed in an endocatch bag and removed intact through the epigastric trocar site. The right upper quadrant was reinspected. Hemostasis was once again confirmed, and reinspection of the abdomen revealed no injuries. The clips were well apposed without any bile leak from the ligated remnant cystic duct or the liver bed, either on direct visualization nor on near-infrared fluorescent cholangiography. The 12mm trocar site in the epigastrium was closed with a 0 vicryl in the fascia under direct visualization using a PMI device. The abdomen was desufflated and all trocars removed. The skin incisions were closed with subcuticular 4-0 monocryl and Dermabond. The patient was awakened, extubated and transported to the recovery room in stable condition.    All counts were correct at the completion of the case.

## 2023-11-19 ENCOUNTER — Encounter (HOSPITAL_COMMUNITY): Payer: Self-pay | Admitting: Surgery

## 2023-11-19 LAB — SURGICAL PATHOLOGY

## 2024-01-09 ENCOUNTER — Telehealth: Admitting: Physician Assistant

## 2024-01-09 DIAGNOSIS — B9689 Other specified bacterial agents as the cause of diseases classified elsewhere: Secondary | ICD-10-CM | POA: Diagnosis not present

## 2024-01-09 DIAGNOSIS — N760 Acute vaginitis: Secondary | ICD-10-CM

## 2024-01-09 MED ORDER — METRONIDAZOLE 0.75 % VA GEL: 1.0000 | Freq: Two times a day (BID) | VAGINAL | 0 refills | Status: DC

## 2024-01-09 NOTE — Patient Instructions (Signed)
 [name], thank you for joining [name], PA-C for today's virtual visit.  While this provider is not your primary care provider (PCP), if your PCP is located in our provider database this encounter information will be shared with them immediately following your visit.   A [facility] MyChart account gives you access to today's visit and all your visits, tests, and labs performed at [facility]  click here if you don't have a [facility] MyChart account or go to [email or web address]  Consent: (Patient) [name] provided verbal consent for this virtual visit at the beginning of the encounter.  Current Medications:  Current Outpatient Medications:    metroNIDAZOLE  (METROGEL ) 0.75 % vaginal gel, Place 1 Applicatorful vaginally 2 (two) times daily., Disp: 70 g, Rfl: 0   cyclobenzaprine  (FLEXERIL ) 10 MG tablet, Take 1 tablet (10 mg total) by mouth 2 (two) times daily as needed for muscle spasms., Disp: 20 tablet, Rfl: 0   esomeprazole  (NEXIUM ) 40 MG packet, Take 40 mg by mouth daily., Disp: 30 each, Rfl: 2   VYVANSE 30 MG capsule, Take 30 mg by mouth daily. (Patient not taking: Reported on 11/10/2023), Disp: , Rfl:    Medications ordered in this encounter:  Meds ordered this encounter  Medications   metroNIDAZOLE  (METROGEL ) 0.75 % vaginal gel    Sig: Place 1 Applicatorful vaginally 2 (two) times daily.    Dispense:  70 g    Refill:  0    Supervising Provider:   BLAISE ALEENE KIDD [[id number]]     *If you need refills on other medications prior to your next appointment, please contact your pharmacy*  Follow-Up: Call back or seek an in-person evaluation if the symptoms worsen or if the condition fails to improve as anticipated.  [facility] [phone]  Other Instructions Vaginal Probiotics: AZO vaginal probiotic OLLY Happy Hoo-Ha RAW Vaginal Care RenewLife Women's vaginal probiotic RepHresh Pro-B  Vaginal washes: Honey  Pot Summer's Eve Vagisil Feminine cleanser   Healthy vaginal hygiene practices    -  Avoid sleeper pajamas. Nightgowns allow air to circulate.  Sleep without underpants whenever possible.   -  Wear cotton underpants during the day. Double-rinse underwear after washing to avoid residual irritants. Do not use fabric softeners for underwear and swimsuits.   - Avoid tights, leotards, leggings, skinny jeans, and other tight-fitting clothing. Skirts and loose-fitting pants allow air to circulate.   - Avoid pantyliners.  Instead use tampons or cotton pads.   - Use the restroom after intercourse to help prevent UTI's   - Daily warm bathing is helpful:     - Soak in clean water  (no soap) for 10 to 15 minutes. Adding vinegar or baking soda to the water  has not been specifically studied and may not be better than clean water  alone.      - Use soap to wash regions other than the genital area just before getting out of the tub. Limit use of any soap on genital areas. Use fragance-free soaps.     - Rinse the genital area well and gently pat dry.  Don't rub.  Hair dryer to assist with drying can be used only if on cool setting.     - Do not use bubble baths or perfumed soaps.   - Do not use any feminine sprays, douches or powders.  These contain chemicals that will irritate the skin.   - If the genital area is tender or swollen, cool compresses may relieve  the discomfort. Unscented wet wipes can be used instead of toilet paper for wiping.    - Emollients, such as Vaseline, may help protect skin and can be applied to the irritated area.   - Always remember to wipe front-to-back after bowel movements. Pat dry after urination.   - Do not sit in wet swimsuits for long periods of time after swimming      If you have been instructed to have an in-person evaluation today at a local Urgent Care facility, please use the link below. It will take you to a list of all of our available [facility], including address, phone number and hours of operation. Please do not delay care.  [facility]  If you or a family member do not have a primary care provider, use the link below to schedule a visit and establish care. When you choose a [facility] primary care physician or advanced practice provider, you gain a long-term partner in health. Find a Primary Care Provider  Learn more about [facility]'s in-office and virtual care options: [facility] - Get Care Now

## 2024-01-09 NOTE — Progress Notes (Signed)
 Virtual Visit Consent   [name], you are scheduled for a virtual visit with a [facility] provider today. Just as with appointments in the office, your consent must be obtained to participate. Your consent will be active for this visit and any virtual visit you may have with one of our providers in the next 365 days. If you have a MyChart account, a copy of this consent can be sent to you electronically.  As this is a virtual visit, video technology does not allow for your provider to perform a traditional examination. This may limit your provider's ability to fully assess your condition. If your provider identifies any concerns that need to be evaluated in person or the need to arrange testing (such as labs, EKG, etc.), we will make arrangements to do so. Although advances in technology are sophisticated, we cannot ensure that it will always work on either your end or our end. If the connection with a video visit is poor, the visit may have to be switched to a telephone visit. With either a video or telephone visit, we are not always able to ensure that we have a secure connection.  By engaging in this virtual visit, you consent to the provision of healthcare and authorize for your insurance to be billed (if applicable) for the services provided during this visit. Depending on your insurance coverage, you may receive a charge related to this service.  I need to obtain your verbal consent now. Are you willing to proceed with your visit today? [name] has provided verbal consent on 01/09/2024 for a virtual visit (video or telephone). [name], PA-C  Date: 01/09/2024 10:28 AM   Virtual Visit via Video Note   I, [name], connected with  [name]  ([id number], 01-24-1986) on 01/09/24 at 10:15 AM EDT by a video-enabled telemedicine application and verified that I am speaking with the correct person using two identifiers.  Location: Patient: Virtual Visit  Location Patient: Home Provider: Virtual Visit Location Provider: Home Office   I discussed the limitations of evaluation and management by telemedicine and the availability of in person appointments. The patient expressed understanding and agreed to proceed.    History of Present Illness: [name] is a 38 y.o. who identifies as a female who was assigned female at birth, and is being seen today for vaginal discharge.  HPI: Vaginal Discharge The patient's primary symptoms include a genital odor and vaginal discharge. The patient's pertinent negatives include no genital itching, genital lesions or genital rash. This is a new problem. The current episode started yesterday. The problem occurs constantly. The problem has been gradually worsening. The patient is experiencing no pain. She is not pregnant. Pertinent negatives include no abdominal pain, back pain, chills, dysuria, fever, headaches, nausea or vomiting. The vaginal discharge was malodorous, white, thin and copious. There has been no bleeding. She has not been passing clots. She has not been passing tissue. Nothing aggravates the symptoms. Treatments tried: boric acid supp. The treatment provided no relief. She is not sexually active.     Problems:  Patient Active Problem List   Diagnosis Date Noted   Intractable chronic migraine without aura and with status migrainosus 03/24/2017   Acute appendicitis    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   Endometriosis 09/16/2012   CERVICALGIA 09/08/2007   CERVICAL SPASM 09/08/2007    Allergies:  Allergies  Allergen Reactions   Ciprofloxacin Shortness Of Breath and Other (See Comments)  Caused chest pain   Dilaudid  [Hydromorphone  Hcl] Shortness Of Breath   Morphine  Itching and Nausea And Vomiting    Have to give benadryl  with morphine , also nausea/vomiting   Adhesive [Tape] Rash   Latex Rash   Methocarbamol  Other (See Comments)    Makes me crazy   Medications:  Current  Outpatient Medications:    metroNIDAZOLE  (METROGEL ) 0.75 % vaginal gel, Place 1 Applicatorful vaginally 2 (two) times daily., Disp: 70 g, Rfl: 0   cyclobenzaprine  (FLEXERIL ) 10 MG tablet, Take 1 tablet (10 mg total) by mouth 2 (two) times daily as needed for muscle spasms., Disp: 20 tablet, Rfl: 0   esomeprazole  (NEXIUM ) 40 MG packet, Take 40 mg by mouth daily., Disp: 30 each, Rfl: 2   VYVANSE 30 MG capsule, Take 30 mg by mouth daily. (Patient not taking: Reported on 11/10/2023), Disp: , Rfl:   Observations/Objective: Patient is well-developed, well-nourished in no acute distress.  Resting comfortably at home.  Head is normocephalic, atraumatic.  No labored breathing.  Speech is clear and coherent with logical content.  Patient is alert and oriented at baseline.    Assessment and Plan: 1. BV (bacterial vaginosis) (Primary) - metroNIDAZOLE  (METROGEL ) 0.75 % vaginal gel; Place 1 Applicatorful vaginally 2 (two) times daily.  Dispense: 70 g; Refill: 0  - Symptoms consistent with BV - Metrogel  prescribed - Limit bubble baths, scented lotions/soaps/detergents - Limit tight fitting clothing - Seek on person evaluation if not improving or if symptoms worsen   Follow Up Instructions: I discussed the assessment and treatment plan with the patient. The patient was provided an opportunity to ask questions and all were answered. The patient agreed with the plan and demonstrated an understanding of the instructions.  A copy of instructions were sent to the patient via MyChart unless otherwise noted below.    The patient was advised to call back or seek an in-person evaluation if the symptoms worsen or if the condition fails to improve as anticipated.    [name], PA-C

## 2024-01-18 ENCOUNTER — Inpatient Hospital Stay
Admission: RE | Admit: 2024-01-18 | Discharge: 2024-01-18 | Discharge status: Home / Self Care | Source: Ambulatory Visit | Attending: Nurse Practitioner

## 2024-01-18 VITALS — BP 112/74 | HR 72 | Temp 98.4°F | Resp 18

## 2024-01-18 DIAGNOSIS — H6591 Unspecified nonsuppurative otitis media, right ear: Secondary | ICD-10-CM | POA: Diagnosis not present

## 2024-01-18 DIAGNOSIS — H6991 Unspecified Eustachian tube disorder, right ear: Secondary | ICD-10-CM | POA: Diagnosis not present

## 2024-01-18 MED ORDER — PREDNISONE 20 MG PO TABS: 40.0000 mg | ORAL_TABLET | Freq: Every day | ORAL | 0 refills | Status: AC

## 2024-01-18 MED ORDER — FLUTICASONE PROPIONATE 50 MCG/ACT NA SUSP: 2.0000 | Freq: Every day | NASAL | 0 refills | Status: AC

## 2024-01-18 NOTE — ED Triage Notes (Signed)
 Right ear pain and jaw pain since yesterday.

## 2024-01-18 NOTE — Discharge Instructions (Signed)
 Take medication as prescribed.  As discussed, begin taking your allergy medication daily. You may take over-the-counter Tylenol  or ibuprofen  as needed for pain, fever, or general discomfort. Apply warm compresses to the ear as needed for pain or discomfort. Do not stick or insert anything inside of the ear while symptoms persist. Avoid entrance of water  inside of the ear while symptoms persist. As discussed, if symptoms fail to improve with this treatment, recommend follow-up with your PCP or ENT for further evaluation. Follow-up as needed.

## 2024-01-18 NOTE — ED Provider Notes (Signed)
 [facility]    CSN: [id number] Arrival date & time: 01/18/24  9051      History   Chief Complaint Chief Complaint  Patient presents with   Ear Fullness    Ear hurts, jaw hurts - Entered by patient    HPI [name] is a 38 y.o. female.   The history is provided by the patient.   Patient presents for complaints of right ear pain and right jaw pain that started over the past day.  Patient reports that she has not had any new injury or trauma.  States that she does develop random episodes of nasal congestion and postnasal drainage.  States that she did have the symptoms prior to her ear starting to hurt.  She states that she also has a history of TMJ.  Patient states that she does have underlying history of allergies, states that she does take allergy medication, but does not take it daily.  Also states that she does do a lot of swimming.  States that she started Vyvanse recently and thought the Vyvanse was causing her symptoms.  She also endorses episodes of dizziness.  She denies fever, chills, ear drainage, decreased hearing, cough, or headaches.  She has not taken any medication for her symptoms.  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 of being hard to wake up post-op   GERD (gastroesophageal reflux disease)    Headache    History of Chiari malformation    History of kidney stones    History of MRSA infection    axilla   Intractable chronic migraine without aura and with status migrainosus 03/24/2017    Laryngotracheitis 06/20/2015   started antibiotic 06/20/2015   Panic attacks    Pneumonia    PTSD (post-traumatic stress disorder)    Shoulder dislocation 06/2015   right    Patient Active Problem List   Diagnosis Date Noted   Intractable chronic migraine without aura and with status migrainosus 03/24/2017   Acute appendicitis    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   Endometriosis 09/16/2012   CERVICALGIA 09/08/2007   CERVICAL SPASM 09/08/2007    Past Surgical History:  Procedure Laterality Date   APPENDECTOMY  2019   CESAREAN SECTION  09/05/2008   CHOLECYSTECTOMY N/A 11/18/2023   Procedure: LAPAROSCOPIC CHOLECYSTECTOMY;  Surgeon: Signe Mitzie LABOR, MD;  Location: [facility];  Service: General;  Laterality: N/A;  LAP CHOLE WITH ICG DYE   CYSTOSCOPY N/A 09/15/2012   Procedure: CYSTOSCOPY;  Surgeon: Lynwood FORBES Clubs II, MD;  Location: [facility];  Service: Gynecology;  Laterality: N/A;   DILATION AND EVACUATION  04/15/2007   HYSTEROSCOPY WITH D & C  03/31/2012   Procedure: DILATATION AND CURETTAGE /HYSTEROSCOPY;  Surgeon: Lynwood FORBES Clubs PONCE, MD;  Location: [facility];  Service: Gynecology;  Laterality: N/A;   LAPAROSCOPIC APPENDECTOMY N/A 10/29/2016   Procedure: APPENDECTOMY LAPAROSCOPIC;  Surgeon: Mavis Anes, MD;  Location: [facility];  Service: General;  Laterality: N/A;   LAPAROSCOPIC ASSISTED VAGINAL HYSTERECTOMY N/A 09/15/2012   Procedure: LAPAROSCOPIC ASSISTED VAGINAL HYSTERECTOMY;  Surgeon: Lynwood FORBES Curlene PONCE, MD;  Location: [facility];  Service: Gynecology;  Laterality: N/A;   LAPAROSCOPY  03/31/2012   Procedure: LAPAROSCOPY OPERATIVE;  Surgeon: Lynwood FORBES Curlene PONCE, MD;  Location: [facility];  Service: Gynecology;  Laterality: N/A;  with Biopsies of Bilateral Fallopian Tubes; Fulguration of Endometriosis   SHOULDER ARTHROSCOPY WITH CAPSULORRHAPHY Right 06/28/2015   Procedure: RIGHT SHOULDER ARTHROSCOPY WITH CAPSULORRHAPHY;  Surgeon: Toribio Silos, MD;  Location: [facility];   Service: Orthopedics;  Laterality: Right;   SUBOCCIPITAL CRANIECTOMY CERVICAL LAMINECTOMY N/A 08/08/2016   Procedure: SUBOCCIPITAL CRANIECTOMY CERVICAL LAMINECTOMY NUMBER DURAPLASTY;  Surgeon: Reyes Budge, MD;  Location: [facility];  Service: Neurosurgery;  Laterality: N/A;  suboccipital   ULNAR NERVE REPAIR Right 05/2015    OB History     Gravida  2   Para  1   Term  1   Preterm      AB  1   Living  1      SAB  1   IAB      Ectopic      Multiple      Live Births               Home Medications    Prior to Admission medications   Medication Sig Start Date End Date Taking? Authorizing Provider  cyclobenzaprine  (FLEXERIL ) 10 MG tablet Take 1 tablet (10 mg total) by mouth 2 (two) times daily as needed for muscle spasms. 10/27/23   Daralene Lonni BIRCH, PA-C  esomeprazole  (NEXIUM ) 40 MG packet Take 40 mg by mouth daily. 11/10/23   Heinz, Camie FORBES, PA-C  VYVANSE 30 MG capsule Take 30 mg by mouth daily. Patient not taking: Reported on 11/10/2023 10/13/23   [provider]    Family History Family History  Problem Relation Age of Onset   Varicose Veins Mother    Anesthesia problems Father        hard to wake up post-op   Stroke Father    Varicose Veins Maternal Grandmother    Colon polyps Maternal Grandfather     Social History Social History   Tobacco Use   Smoking status: Every Day    Current packs/day: 1.00    Average packs/day: 1 pack/day for 11.5 years (11.5 ttl pk-yrs)    Types: Cigarettes    Start date: 2024   Smokeless tobacco: Never   Tobacco comments:    quit 2 months ago  Vaping Use   Vaping status: Never Used  Substance Use Topics   Alcohol use: Yes    Comment: occasionally    Drug use: No     Allergies   Ciprofloxacin, Dilaudid  [hydromorphone  hcl], Morphine , Adhesive [tape], Latex, and Methocarbamol    Review of Systems Review of Systems Per HPI  Physical Exam Triage Vital Signs ED Triage Vitals  Encounter Vitals Group      BP 01/18/24 1019 112/74     Girls Systolic BP Percentile --      Girls Diastolic BP Percentile --      Boys Systolic BP Percentile --      Boys Diastolic BP Percentile --      Pulse Rate 01/18/24 1019 72     Resp 01/18/24 1019 18     Temp 01/18/24 1019 98.4 F (36.9 C)     Temp Source 01/18/24 1019 Oral     SpO2 01/18/24 1019  98 %     Weight --      Height --      Head Circumference --      Peak Flow --      Pain Score 01/18/24 1020 3     Pain Loc --      Pain Education --      Exclude from Growth Chart --    No data found.  Updated Vital Signs BP 112/74 (BP Location: Right Arm)   Pulse 72   Temp 98.4 F (36.9 C) (Oral)   Resp 18   LMP 08/18/2012   SpO2 98%   Visual Acuity Right Eye Distance:   Left Eye Distance:   Bilateral Distance:    Right Eye Near:   Left Eye Near:    Bilateral Near:     Physical Exam Vitals and nursing note reviewed.  Constitutional:      General: She is not in acute distress.    Appearance: Normal appearance.  HENT:     Head: Normocephalic.     Right Ear: Ear canal and external ear normal. No drainage or tenderness. A middle ear effusion is present.     Left Ear: Tympanic membrane, ear canal and external ear normal.     Nose: Nose normal.     Mouth/Throat:     Lips: Pink.     Mouth: Mucous membranes are moist.     Pharynx: Oropharynx is clear. Uvula midline.  Eyes:     Extraocular Movements: Extraocular movements intact.     Conjunctiva/sclera: Conjunctivae normal.     Pupils: Pupils are equal, round, and reactive to light.  Cardiovascular:     Rate and Rhythm: Normal rate and regular rhythm.     Pulses: Normal pulses.     Heart sounds: Normal heart sounds.  Pulmonary:     Effort: Pulmonary effort is normal.     Breath sounds: Normal breath sounds.  Abdominal:     General: Bowel sounds are normal.     Palpations: Abdomen is soft.  Musculoskeletal:     Cervical back: Normal range of motion.  Skin:    General: Skin is  warm and dry.  Neurological:     General: No focal deficit present.     Mental Status: She is alert and oriented to person, place, and time.  Psychiatric:        Mood and Affect: Mood normal.        Behavior: Behavior normal.      UC Treatments / Results  Labs (all labs ordered are listed, but only abnormal results are displayed) Labs Reviewed - No data to display  EKG   Radiology No results found.  Procedures Procedures (including critical care time)  Medications Ordered in UC Medications - No data to display  Initial Impression / Assessment and Plan / UC Course  I have reviewed the triage vital signs and the nursing notes.  Pertinent labs & imaging results that were available during my care of the patient were reviewed by me and considered in my medical decision making (see chart for details).  On exam, patient with a right middle ear effusion.  Will treat with fluticasone  50 mcg nasal spray and prednisone  40 mg for the next 5 days.  Supportive care recommendations were provided and discussed with the patient to include over-the-counter analgesics, warm compresses to the ear, and to avoid entrance of water  inside of the ear while symptoms persist.  Patient was advised if symptoms fail  to improve, it is recommended that she follow-up with ENT for further evaluation.  Patient was in agreement with this plan of care and verbalizes understanding.  All questions were answered.  Patient stable for discharge.   Final Clinical Impressions(s) / UC Diagnoses   Final diagnoses:  None   Discharge Instructions   None    ED Prescriptions   None    PDMP not reviewed this encounter.   Gilmer Etta PARAS, NP 01/18/24 1048

## 2024-01-25 ENCOUNTER — Other Ambulatory Visit: Payer: Self-pay

## 2024-01-25 ENCOUNTER — Ambulatory Visit
Admission: RE | Admit: 2024-01-25 | Discharge: 2024-01-25 | Disposition: A | Discharge status: Home / Self Care | Source: Ambulatory Visit | Attending: Family Medicine | Admitting: Family Medicine

## 2024-01-25 VITALS — BP 137/85 | HR 81 | Temp 98.9°F | Resp 20

## 2024-01-25 DIAGNOSIS — H6993 Unspecified Eustachian tube disorder, bilateral: Secondary | ICD-10-CM | POA: Diagnosis not present

## 2024-01-25 MED ORDER — AZELASTINE HCL 0.1 % NA SOLN: 1.0000 | Freq: Two times a day (BID) | NASAL | 2 refills | Status: DC

## 2024-01-25 MED ORDER — PSEUDOEPHEDRINE HCL 60 MG PO TABS: 60.0000 mg | ORAL_TABLET | Freq: Three times a day (TID) | ORAL | 0 refills | Status: DC | PRN

## 2024-01-25 NOTE — ED Triage Notes (Signed)
 Pt reports was seen for right ear pain last Sunday. Reports completed drops and prednisone . Reports left ear pain that extends to left side of neck now.

## 2024-01-25 NOTE — ED Provider Notes (Signed)
 [facility]    CSN: [id number] Arrival date & time: 01/25/24  0847      History   Chief Complaint Chief Complaint  Patient presents with   Ear Drainage    Recurring ear issues - Entered by patient    HPI [name] is a 38 y.o. female.   Patient presenting today with ongoing right ear pain for which she was seen 1 week ago and given prednisone , Flonase .  She states this started to get a bit better but is still hurting but now the left ear is hurting significantly worse in the right ear ever did.  Denies loss of hearing, fever, chills, bleeding, drainage.  Does have a history of eustachian tube dysfunction and seasonal allergies.  Currently on Zyrtec, Flonase  here and there though it causes nosebleeds and tried to take some of the prednisone  but has side effects so has not completed the course.    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 of being hard to wake up post-op   GERD (gastroesophageal reflux disease)    Headache    History of Chiari malformation    History of kidney stones    History of MRSA infection    axilla   Intractable chronic migraine without aura and with status migrainosus 03/24/2017   Laryngotracheitis 06/20/2015   started antibiotic 06/20/2015   Panic attacks    Pneumonia    PTSD (post-traumatic stress disorder)    Shoulder dislocation 06/2015   right    Patient Active Problem List   Diagnosis Date Noted   Intractable chronic migraine without aura and with status migrainosus  03/24/2017   Acute appendicitis    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   Endometriosis 09/16/2012   CERVICALGIA 09/08/2007   CERVICAL SPASM 09/08/2007    Past Surgical History:  Procedure Laterality Date   APPENDECTOMY  2019   CESAREAN SECTION  09/05/2008   CHOLECYSTECTOMY N/A 11/18/2023   Procedure: LAPAROSCOPIC CHOLECYSTECTOMY;  Surgeon: Signe Mitzie LABOR, MD;  Location: [facility];  Service: General;  Laterality: N/A;  LAP CHOLE WITH ICG DYE   CYSTOSCOPY N/A 09/15/2012   Procedure: CYSTOSCOPY;  Surgeon: Lynwood FORBES Clubs II, MD;  Location: [facility];  Service: Gynecology;  Laterality: N/A;   DILATION AND EVACUATION  04/15/2007   HYSTEROSCOPY WITH D & C  03/31/2012   Procedure: DILATATION AND CURETTAGE /HYSTEROSCOPY;  Surgeon: Lynwood FORBES Clubs PONCE, MD;  Location: [facility];  Service: Gynecology;  Laterality: N/A;   LAPAROSCOPIC APPENDECTOMY N/A 10/29/2016   Procedure: APPENDECTOMY LAPAROSCOPIC;  Surgeon: Mavis Anes, MD;  Location: [facility];  Service: General;  Laterality: N/A;   LAPAROSCOPIC ASSISTED VAGINAL HYSTERECTOMY N/A 09/15/2012   Procedure: LAPAROSCOPIC ASSISTED VAGINAL HYSTERECTOMY;  Surgeon: Lynwood FORBES Clubs PONCE, MD;  Location: [facility];  Service: Gynecology;  Laterality: N/A;   LAPAROSCOPY  03/31/2012   Procedure: LAPAROSCOPY OPERATIVE;  Surgeon: Lynwood FORBES Clubs PONCE, MD;  Location: [facility];  Service: Gynecology;  Laterality: N/A;  with Biopsies of Bilateral Fallopian Tubes; Fulguration of Endometriosis   SHOULDER ARTHROSCOPY WITH CAPSULORRHAPHY Right 06/28/2015   Procedure: RIGHT SHOULDER ARTHROSCOPY WITH CAPSULORRHAPHY;  Surgeon: Toribio Silos, MD;  Location: [facility];  Service: Orthopedics;  Laterality: Right;   SUBOCCIPITAL CRANIECTOMY CERVICAL LAMINECTOMY N/A 08/08/2016   Procedure: SUBOCCIPITAL CRANIECTOMY CERVICAL LAMINECTOMY NUMBER DURAPLASTY;  Surgeon: Reyes Budge, MD;  Location: [facility];  Service: Neurosurgery;  Laterality: N/A;  suboccipital   ULNAR NERVE REPAIR  Right 05/2015    OB History     Gravida  2   Para  1   Term  1   Preterm      AB  1   Living  1      SAB  1   IAB      Ectopic      Multiple      Live Births               Home Medications    Prior to Admission medications   Medication Sig Start Date End Date Taking? Authorizing Provider  azelastine  (ASTELIN ) 0.1 % nasal spray Place 1 spray into both nostrils 2 (two) times daily. Use in each nostril as directed 01/25/24  Yes Stuart Vernell Norris, PA-C  pseudoephedrine  (SUDAFED) 60 MG tablet Take 1 tablet (60 mg total) by mouth every 8 (eight) hours as needed for congestion. 01/25/24  Yes Stuart Vernell Norris, PA-C  cyclobenzaprine  (FLEXERIL ) 10 MG tablet Take 1 tablet (10 mg total) by mouth 2 (two) times daily as needed for muscle spasms. 10/27/23   Daralene Lonni BIRCH, PA-C  esomeprazole  (NEXIUM ) 40 MG packet Take 40 mg by mouth daily. 11/10/23   Heinz, Sara E, PA-C  fluticasone  (FLONASE ) 50 MCG/ACT nasal spray Place 2 sprays into both nostrils daily. 01/18/24   Leath-Warren, Etta PARAS, NP  VYVANSE 30 MG capsule Take 30 mg by mouth daily. Patient not taking: Reported on 11/10/2023 10/13/23   [provider]    Family History Family History  Problem Relation Age of Onset   Varicose Veins Mother    Anesthesia problems Father        hard to wake up post-op   Stroke Father    Varicose Veins Maternal Grandmother    Colon polyps Maternal Grandfather     Social History Social History   Tobacco Use   Smoking status: Every Day    Current packs/day: 1.00    Average packs/day: 1 pack/day for 11.6 years (11.6 ttl pk-yrs)    Types: Cigarettes    Start date: 2024   Smokeless tobacco: Never   Tobacco comments:    quit 2 months ago  Vaping Use   Vaping status: Never Used  Substance Use Topics   Alcohol use: Yes    Comment: occasionally    Drug use: No     Allergies   Ciprofloxacin, Dilaudid  [hydromorphone  hcl], Morphine , Adhesive [tape],  Latex, and Methocarbamol    Review of Systems Review of Systems Per HPI  Physical Exam Triage Vital Signs ED Triage Vitals  Encounter Vitals Group     BP 01/25/24 0901 137/85     Girls Systolic BP Percentile --      Girls Diastolic BP Percentile --      Boys Systolic BP Percentile --      Boys Diastolic BP Percentile --      Pulse Rate 01/25/24 0901 81     Resp 01/25/24 0901 20     Temp 01/25/24  0901 98.9 F (37.2 C)     Temp Source 01/25/24 0901 Oral     SpO2 01/25/24 0901 98 %     Weight --      Height --      Head Circumference --      Peak Flow --      Pain Score 01/25/24 0902 5     Pain Loc --      Pain Education --      Exclude from Growth Chart --    No data found.  Updated Vital Signs BP 137/85 (BP Location: Right Arm)   Pulse 81   Temp 98.9 F (37.2 C) (Oral)   Resp 20   LMP 08/18/2012   SpO2 98%   Visual Acuity Right Eye Distance:   Left Eye Distance:   Bilateral Distance:    Right Eye Near:   Left Eye Near:    Bilateral Near:     Physical Exam Vitals and nursing note reviewed.  Constitutional:      Appearance: Normal appearance. She is not ill-appearing.  HENT:     Head: Atraumatic.     Ears:     Comments: Bilateral middle ear effusions left worse than right    Nose: Nose normal.     Mouth/Throat:     Mouth: Mucous membranes are moist.     Pharynx: Oropharynx is clear.  Eyes:     Extraocular Movements: Extraocular movements intact.     Conjunctiva/sclera: Conjunctivae normal.  Cardiovascular:     Rate and Rhythm: Normal rate and regular rhythm.  Pulmonary:     Effort: Pulmonary effort is normal.  Musculoskeletal:        General: Normal range of motion.     Cervical back: Normal range of motion and neck supple.  Skin:    General: Skin is warm and dry.  Neurological:     Mental Status: She is alert and oriented to person, place, and time.  Psychiatric:        Mood and Affect: Mood normal.        Thought Content: Thought content  normal.        Judgment: Judgment normal.      UC Treatments / Results  Labs (all labs ordered are listed, but only abnormal results are displayed) Labs Reviewed - No data to display  EKG   Radiology No results found.  Procedures Procedures (including critical care time)  Medications Ordered in UC Medications - No data to display  Initial Impression / Assessment and Plan / UC Course  I have reviewed the triage vital signs and the nursing notes.  Pertinent labs & imaging results that were available during my care of the patient were reviewed by me and considered in my medical decision making (see chart for details).     Bilateral middle ear effusions, no evidence of a bacterial ear infection at this time.  Treat with as needed Sudafed, Astelin  consistently in addition to the Zyrtec for allergy regimen, saline sinus rinses, humidifiers.  ENT follow-up if still not resolving.  Final Clinical Impressions(s) / UC Diagnoses   Final diagnoses:  Acute dysfunction of Eustachian tube, bilateral   Discharge Instructions   None    ED Prescriptions     Medication Sig Dispense Auth. Provider   pseudoephedrine  (SUDAFED) 60 MG tablet Take 1 tablet (60 mg total) by mouth every 8 (eight) hours as needed for congestion. 20 tablet Stuart Millman [place], PA-C   azelastine  (ASTELIN ) 0.1 %  nasal spray Place 1 spray into both nostrils 2 (two) times daily. Use in each nostril as directed 30 mL Stuart Vernell Norris, PA-C      PDMP not reviewed this encounter.   Stuart Vernell [place], PA-C 01/25/24 [phone]

## 2024-02-19 ENCOUNTER — Ambulatory Visit
Admission: RE | Admit: 2024-02-19 | Discharge: 2024-02-19 | Disposition: A | Discharge status: Home / Self Care | Source: Ambulatory Visit | Attending: Family Medicine | Admitting: Family Medicine

## 2024-02-19 VITALS — BP 112/78 | HR 85 | Temp 98.2°F | Resp 20

## 2024-02-19 DIAGNOSIS — R5383 Other fatigue: Secondary | ICD-10-CM | POA: Insufficient documentation

## 2024-02-19 DIAGNOSIS — J029 Acute pharyngitis, unspecified: Secondary | ICD-10-CM | POA: Diagnosis not present

## 2024-02-19 LAB — POCT RAPID STREP A (OFFICE): Rapid Strep A Screen: NEGATIVE

## 2024-02-19 MED ORDER — LIDOCAINE VISCOUS HCL 2 % MT SOLN: 10.0000 mL | OROMUCOSAL | 0 refills | Status: AC | PRN

## 2024-02-19 NOTE — Discharge Instructions (Signed)
 Your rapid strep test was negative today.  I have sent out for culture just to make sure.  I have sent over some numbing liquid for you to gargle as needed for pain relief, and you may gargle warm salt water , drink warm liquids and take over-the-counter pain relievers as needed.  Follow-up for significantly worsening symptoms.

## 2024-02-19 NOTE — ED Provider Notes (Signed)
 [facility]    CSN: [id number] Arrival date & time: 02/19/24  1228      History   Chief Complaint Chief Complaint  Patient presents with   Sore Throat    Entered by patient    HPI [name] is a 38 y.o. female.   Patient presenting today with 1 day history of left-sided sore throat and mouth ulcers that she first noticed after eating a sharp [place] fries yesterday.  She also states she feels fatigued and overall not well, but no known fevers, cough, congestion, difficulty breathing or swallowing.  So far not trying anything over-the-counter for symptoms.    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 of being hard to wake up post-op   GERD (gastroesophageal reflux disease)    Headache    History of Chiari malformation    History of kidney stones    History of MRSA infection    axilla   Intractable chronic migraine without aura and with status migrainosus 03/24/2017   Laryngotracheitis 06/20/2015   started antibiotic 06/20/2015   Panic attacks    Pneumonia    PTSD (post-traumatic stress disorder)    Shoulder dislocation 06/2015   right    Patient Active Problem List   Diagnosis Date Noted   Intractable chronic migraine without aura and with status migrainosus 03/24/2017   Acute appendicitis    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   Endometriosis 09/16/2012   CERVICALGIA 09/08/2007   CERVICAL SPASM 09/08/2007    Past Surgical History:  Procedure Laterality Date   APPENDECTOMY  2019    CESAREAN SECTION  09/05/2008   CHOLECYSTECTOMY N/A 11/18/2023   Procedure: LAPAROSCOPIC CHOLECYSTECTOMY;  Surgeon: Signe Mitzie LABOR, MD;  Location: [facility];  Service: General;  Laterality: N/A;  LAP CHOLE WITH ICG DYE   CYSTOSCOPY N/A 09/15/2012   Procedure: CYSTOSCOPY;  Surgeon: Lynwood FORBES Clubs II, MD;  Location: [facility];  Service: Gynecology;  Laterality: N/A;   DILATION AND EVACUATION  04/15/2007   HYSTEROSCOPY WITH D & C  03/31/2012   Procedure: DILATATION AND CURETTAGE /HYSTEROSCOPY;  Surgeon: Lynwood FORBES Clubs PONCE, MD;  Location: [facility];  Service: Gynecology;  Laterality: N/A;   LAPAROSCOPIC APPENDECTOMY N/A 10/29/2016   Procedure: APPENDECTOMY LAPAROSCOPIC;  Surgeon: Mavis Anes, MD;  Location: [facility];  Service: General;  Laterality: N/A;   LAPAROSCOPIC ASSISTED VAGINAL HYSTERECTOMY N/A 09/15/2012   Procedure: LAPAROSCOPIC ASSISTED VAGINAL HYSTERECTOMY;  Surgeon: Lynwood FORBES Clubs PONCE, MD;  Location: [facility];  Service: Gynecology;  Laterality: N/A;   LAPAROSCOPY  03/31/2012   Procedure: LAPAROSCOPY OPERATIVE;  Surgeon: Lynwood FORBES Clubs PONCE, MD;  Location: [facility];  Service: Gynecology;  Laterality: N/A;  with Biopsies of Bilateral Fallopian Tubes; Fulguration of Endometriosis   SHOULDER ARTHROSCOPY WITH CAPSULORRHAPHY Right 06/28/2015   Procedure: RIGHT SHOULDER ARTHROSCOPY WITH CAPSULORRHAPHY;  Surgeon: Toribio Silos, MD;  Location: [facility];  Service: Orthopedics;  Laterality: Right;   SUBOCCIPITAL  CRANIECTOMY CERVICAL LAMINECTOMY N/A 08/08/2016   Procedure: SUBOCCIPITAL CRANIECTOMY CERVICAL LAMINECTOMY NUMBER DURAPLASTY;  Surgeon: Reyes Budge, MD;  [facility];  Service: Neurosurgery;  Laterality: N/A;  suboccipital   ULNAR NERVE REPAIR Right 05/2015    OB History     Gravida  2   Para  1   Term  1   Preterm      AB  1   Living  1      SAB  1   IAB      Ectopic      Multiple      Live Births               Home Medications    Prior to  Admission medications   Medication Sig Start Date End Date Taking? Authorizing Provider  amphetamine-dextroamphetamine (ADDERALL XR) 5 MG 24 hr capsule Take 5 mg by mouth daily.   Yes [provider]  lidocaine  (XYLOCAINE ) 2 % solution Use as directed 10 mLs in the mouth or throat every 3 (three) hours as needed. 02/19/24  Yes Stuart Vernell Norris, PA-C  esomeprazole  (NEXIUM ) 40 MG packet Take 40 mg by mouth daily. 11/10/23   Heinz, Sara E, PA-C  fluticasone  (FLONASE ) 50 MCG/ACT nasal spray Place 2 sprays into both nostrils daily. 01/18/24   Leath-Warren, Etta PARAS, NP    Family History Family History  Problem Relation Age of Onset   Varicose Veins Mother    Anesthesia problems Father        hard to wake up post-op   Stroke Father    Varicose Veins Maternal Grandmother    Colon polyps Maternal Grandfather     Social History Social History   Tobacco Use   Smoking status: Every Day    Current packs/day: 1.00    Average packs/day: 1 pack/day for 11.6 years (11.6 ttl pk-yrs)    Types: Cigarettes    Start date: 2024   Smokeless tobacco: Never   Tobacco comments:    quit 2 months ago  Vaping Use   Vaping status: Never Used  Substance Use Topics   Alcohol use: Yes    Comment: occasionally    Drug use: No     Allergies   Ciprofloxacin, Dilaudid  [hydromorphone  hcl], Morphine , Adhesive [tape], Latex, and Methocarbamol    Review of Systems Review of Systems Per HPI  Physical Exam Triage Vital Signs ED Triage Vitals  Encounter Vitals Group     BP 02/19/24 1239 112/78     Girls Systolic BP Percentile --      Girls Diastolic BP Percentile --      Boys Systolic BP Percentile --      Boys Diastolic BP Percentile --      Pulse Rate 02/19/24 1239 85     Resp 02/19/24 1239 20     Temp 02/19/24 1239 98.2 F (36.8 C)     Temp Source 02/19/24 1239 Oral     SpO2 02/19/24 1239 97 %     Weight --      Height --      Head Circumference --      Peak Flow --      Pain  Score 02/19/24 1241 4     Pain Loc --      Pain Education --      Exclude from Growth Chart --    No data found.  Updated Vital Signs BP 112/78 (BP Location: Right Arm)   Pulse 85   Temp  98.2 F (36.8 C) (Oral)   Resp 20   LMP 08/18/2012   SpO2 97%   Visual Acuity Right Eye Distance:   Left Eye Distance:   Bilateral Distance:    Right Eye Near:   Left Eye Near:    Bilateral Near:     Physical Exam Vitals and nursing note reviewed.  Constitutional:      Appearance: Normal appearance. She is not ill-appearing.  HENT:     Head: Atraumatic.     Mouth/Throat:     Mouth: Mucous membranes are moist.     Comments: Small white shallow ulcerations present to the left lateral oral mucosa and left tonsils.  No significant erythema, tonsillar edema bilaterally.  Uvula midline, oral airway patent Eyes:     Extraocular Movements: Extraocular movements intact.     Conjunctiva/sclera: Conjunctivae normal.  Cardiovascular:     Rate and Rhythm: Normal rate.  Pulmonary:     Effort: Pulmonary effort is normal.  Musculoskeletal:        General: Normal range of motion.     Cervical back: Normal range of motion and neck supple.  Lymphadenopathy:     Cervical: No cervical adenopathy.  Skin:    General: Skin is warm and dry.  Neurological:     Mental Status: She is alert and oriented to person, place, and time.  Psychiatric:        Mood and Affect: Mood normal.        Thought Content: Thought content normal.        Judgment: Judgment normal.      UC Treatments / Results  Labs (all labs ordered are listed, but only abnormal results are displayed) Labs Reviewed  POCT RAPID STREP A (OFFICE) - Normal  CULTURE, GROUP A STREP [facility])    EKG   Radiology No results found.  Procedures Procedures (including critical care time)  Medications Ordered in UC Medications - No data to display  Initial Impression / Assessment and Plan / UC Course  I have reviewed the triage vital  signs and the nursing notes.  Pertinent labs & imaging results that were available during my care of the patient were reviewed by me and considered in my medical decision making (see chart for details).     Rapid strep negative and vitals and exam reassuring.  Throat culture pending, suspect viral etiology.  Treat with viscous lidocaine , supportive over-the-counter medications and home care.  Return for worsening symptoms.  Final Clinical Impressions(s) / UC Diagnoses   Final diagnoses:  Acute pharyngitis, unspecified etiology     Discharge Instructions      Your rapid strep test was negative today.  I have sent out for culture just to make sure.  I have sent over some numbing liquid for you to gargle as needed for pain relief, and you may gargle warm salt water , drink warm liquids and take over-the-counter pain relievers as needed.  Follow-up for significantly worsening symptoms.    ED Prescriptions     Medication Sig Dispense Auth. Provider   lidocaine  (XYLOCAINE ) 2 % solution Use as directed 10 mLs in the mouth or throat every 3 (three) hours as needed. 100 mL Stuart Vernell Norris, [place]      PDMP not reviewed this encounter.   Stuart Vernell Norris, [place] 02/19/24 1354

## 2024-02-19 NOTE — ED Triage Notes (Signed)
 After eating a crispy french fry yesterday, she felt it scratch the left side of her throat.  States that side of her throat hurts.

## 2024-02-22 LAB — CULTURE, GROUP A STREP (THRC)

## 2024-04-28 ENCOUNTER — Ambulatory Visit
Admission: RE | Admit: 2024-04-28 | Discharge: 2024-04-28 | Disposition: A | Discharge status: Home / Self Care | Source: Ambulatory Visit | Attending: Nurse Practitioner | Admitting: Nurse Practitioner

## 2024-04-28 ENCOUNTER — Other Ambulatory Visit: Payer: Self-pay

## 2024-04-28 VITALS — BP 114/76 | HR 77 | Temp 98.6°F | Resp 20

## 2024-04-28 DIAGNOSIS — H6991 Unspecified Eustachian tube disorder, right ear: Secondary | ICD-10-CM | POA: Diagnosis not present

## 2024-04-28 DIAGNOSIS — H9201 Otalgia, right ear: Secondary | ICD-10-CM

## 2024-04-28 MED ORDER — PREDNISONE 20 MG PO TABS: 40.0000 mg | ORAL_TABLET | Freq: Every day | ORAL | 0 refills | Status: AC

## 2024-04-28 NOTE — ED Triage Notes (Signed)
 Pt reports right ear pain for last several days, worsening yesterday. Pt reports had dental procedure yesterday and has hx of TMJ.

## 2024-04-28 NOTE — Discharge Instructions (Addendum)
 Take medication as prescribed. As discussed, continue your current allergy medication daily.  You may take over-the-counter Tylenol  or ibuprofen  as needed for pain, fever, or general discomfort. Apply warm compresses to the ear as needed for pain or discomfort. Do not stick or insert anything inside of the ear while symptoms persist. Avoid entrance of water  inside of the ear while symptoms persist. A referral has been placed for ENT.  Please call to schedule an appointment. Follow-up as needed.

## 2024-04-28 NOTE — ED Provider Notes (Signed)
 [facility]    CSN: [id number] Arrival date & time: 04/28/24  1247      History   Chief Complaint Chief Complaint  Patient presents with   Ear Fullness    Entered by patient    HPI [name] is a 38 y.o. female.   The history is provided by the patient.   Patient presents with a several day history of right ear pain and fullness.  Patient states symptoms worsen over the past 24 hours.  Patient also reports that she has a history of TMJ and had a dental procedure on yesterday.  She denies fever, chills, ear drainage, nasal congestion, runny nose, cough, lightheadedness, decreased hearing, or dizziness.  Patient states she has been seen for the same or similar symptoms in the past.  States that she did get some relief with prednisone .  States that she was unable to take Flonase  daily as she developed nosebleeds, and also states that she was prescribed Sudafed, but all it did was jacked up my blood pressure.  Patient states that she did talk to her PCP about a referral to ENT, but PCP would not place referral.  Patient states that she has been taking Zyrtec daily.  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 of being hard to wake up post-op   GERD (gastroesophageal reflux disease)    Headache    History of Chiari malformation    History of kidney stones    History of MRSA infection    axilla   Intractable chronic migraine without aura and with status migrainosus 03/24/2017   Laryngotracheitis 06/20/2015    started antibiotic 06/20/2015   Panic attacks    Pneumonia    PTSD (post-traumatic stress disorder)    Shoulder dislocation 06/2015   right    Patient Active Problem List   Diagnosis Date Noted   Intractable chronic migraine without aura and with status migrainosus 03/24/2017   Acute appendicitis    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   Endometriosis 09/16/2012   CERVICALGIA 09/08/2007   CERVICAL SPASM 09/08/2007    Past Surgical History:  Procedure Laterality Date   APPENDECTOMY  2019   CESAREAN SECTION  09/05/2008   CHOLECYSTECTOMY N/A 11/18/2023   Procedure: LAPAROSCOPIC CHOLECYSTECTOMY;  Surgeon: Signe Mitzie LABOR, MD;  Location: [facility];  Service: General;  Laterality: N/A;  LAP CHOLE WITH ICG DYE   CYSTOSCOPY N/A 09/15/2012   Procedure: CYSTOSCOPY;  Surgeon: Lynwood FORBES Clubs II, MD;  Location: [facility];  Service: Gynecology;  Laterality: N/A;   DILATION AND EVACUATION  04/15/2007   HYSTEROSCOPY WITH D & C  03/31/2012   Procedure: DILATATION AND CURETTAGE /HYSTEROSCOPY;  Surgeon: Lynwood FORBES Clubs PONCE, MD;  Location: [facility];  Service: Gynecology;  Laterality: N/A;   LAPAROSCOPIC APPENDECTOMY N/A 10/29/2016   Procedure: APPENDECTOMY LAPAROSCOPIC;  Surgeon: Mavis Anes, MD;  Location: [facility];  Service: General;  Laterality: N/A;   LAPAROSCOPIC ASSISTED VAGINAL HYSTERECTOMY  N/A 09/15/2012   Procedure: LAPAROSCOPIC ASSISTED VAGINAL HYSTERECTOMY;  Surgeon: Lynwood FORBES Curlene PONCE, MD;  Location: [facility];  Service: Gynecology;  Laterality: N/A;   LAPAROSCOPY  03/31/2012   Procedure: LAPAROSCOPY OPERATIVE;  Surgeon: Lynwood FORBES Curlene PONCE, MD;  Location: [facility];  Service: Gynecology;  Laterality: N/A;  with Biopsies of Bilateral Fallopian Tubes; Fulguration of Endometriosis   SHOULDER ARTHROSCOPY WITH CAPSULORRHAPHY Right 06/28/2015   Procedure: RIGHT SHOULDER ARTHROSCOPY WITH CAPSULORRHAPHY;  Surgeon: Toribio Silos, MD;  Location: [facility];  Service: Orthopedics;  Laterality:  Right;   SUBOCCIPITAL CRANIECTOMY CERVICAL LAMINECTOMY N/A 08/08/2016   Procedure: SUBOCCIPITAL CRANIECTOMY CERVICAL LAMINECTOMY NUMBER DURAPLASTY;  Surgeon: Reyes Budge, MD;  Location: [facility];  Service: Neurosurgery;  Laterality: N/A;  suboccipital   ULNAR NERVE REPAIR Right 05/2015    OB History     Gravida  2   Para  1   Term  1   Preterm      AB  1   Living  1      SAB  1   IAB      Ectopic      Multiple      Live Births               Home Medications    Prior to Admission medications   Medication Sig Start Date End Date Taking? Authorizing Provider  amphetamine-dextroamphetamine (ADDERALL XR) 5 MG 24 hr capsule Take 5 mg by mouth daily.    [provider]  esomeprazole  (NEXIUM ) 40 MG packet Take 40 mg by mouth daily. 11/10/23   Heinz, Sara E, PA-C  fluticasone  (FLONASE ) 50 MCG/ACT nasal spray Place 2 sprays into both nostrils daily. 01/18/24   Leath-Warren, Etta PARAS, NP  lidocaine  (XYLOCAINE ) 2 % solution Use as directed 10 mLs in the mouth or throat every 3 (three) hours as needed. 02/19/24   Stuart Vernell Norris, PA-C    Family History Family History  Problem Relation Age of Onset   Varicose Veins Mother    Anesthesia problems Father        hard to wake up post-op   Stroke Father    Varicose Veins Maternal Grandmother    Colon polyps Maternal Grandfather     Social History Social History   Tobacco Use   Smoking status: Every Day    Current packs/day: 1.00    Average packs/day: 1 pack/day for 11.8 years (11.8 ttl pk-yrs)    Types: Cigarettes    Start date: 2024   Smokeless tobacco: Never   Tobacco comments:    quit 2 months ago  Vaping Use   Vaping status: Never Used  Substance Use Topics   Alcohol use: Yes    Comment: occasionally    Drug use: No     Allergies   Ciprofloxacin, Dilaudid  [hydromorphone  hcl], Morphine , Adhesive [tape], Latex, and Methocarbamol    Review of Systems Review of Systems Per  HPI  Physical Exam Triage Vital Signs ED Triage Vitals  Encounter Vitals Group     BP 04/28/24 1306 114/76     Girls Systolic BP Percentile --      Girls Diastolic BP Percentile --      Boys Systolic BP Percentile --      Boys Diastolic BP Percentile --      Pulse Rate 04/28/24 1306 77     Resp 04/28/24 1306 20     Temp 04/28/24 1306 98.6 F (37 C)     Temp Source  04/28/24 1306 Oral     SpO2 04/28/24 1306 98 %     Weight --      Height --      Head Circumference --      Peak Flow --      Pain Score 04/28/24 1305 4     Pain Loc --      Pain Education --      Exclude from Growth Chart --    No data found.  Updated Vital Signs BP 114/76 (BP Location: Right Arm)   Pulse 77   Temp 98.6 F (37 C) (Oral)   Resp 20   LMP 08/18/2012   SpO2 98%   Visual Acuity Right Eye Distance:   Left Eye Distance:   Bilateral Distance:    Right Eye Near:   Left Eye Near:    Bilateral Near:     Physical Exam Vitals and nursing note reviewed.  Constitutional:      General: She is not in acute distress.    Appearance: Normal appearance.  HENT:     Head: Normocephalic.     Right Ear: Hearing, ear canal and external ear normal. A middle ear effusion is present.     Left Ear: Tympanic membrane, ear canal and external ear normal.     Nose: Nose normal.     Mouth/Throat:     Mouth: Mucous membranes are moist.  Eyes:     Extraocular Movements: Extraocular movements intact.     Pupils: Pupils are equal, round, and reactive to light.  Cardiovascular:     Rate and Rhythm: Normal rate and regular rhythm.     Pulses: Normal pulses.     Heart sounds: Normal heart sounds.  Pulmonary:     Effort: Pulmonary effort is normal.     Breath sounds: Normal breath sounds.  Musculoskeletal:     Cervical back: Normal range of motion.  Skin:    General: Skin is warm and dry.  Neurological:     General: No focal deficit present.     Mental Status: She is alert and oriented to person, place, and  time.  Psychiatric:        Mood and Affect: Mood normal.        Behavior: Behavior normal.      UC Treatments / Results  Labs (all labs ordered are listed, but only abnormal results are displayed) Labs Reviewed - No data to display  EKG   Radiology No results found.  Procedures Procedures (including critical care time)  Medications Ordered in UC Medications - No data to display  Initial Impression / Assessment and Plan / UC Course  I have reviewed the triage vital signs and the nursing notes.  Pertinent labs & imaging results that were available during my care of the patient were reviewed by me and considered in my medical decision making (see chart for details).  On exam, patient with a right middle ear effusion.  Will treat with Prednisone  40 mg for the next 5 days.  Patient advised to continue Zyrtec and azelastine  nasal spray as prescribed.  Referral was also placed for patient for ENT, patient will call to schedule an appointment.  Supportive care recommendations were provided and discussed with the patient to include over-the-counter analgesics, warm compresses to the ear, and to avoid entrance of water  inside of the ear while symptoms persist.  Patient was in agreement with this plan of care and verbalized understanding.  Discussed indications with patient regarding follow-up.  All questions were answered.  Patient stable for discharge.    Final Clinical Impressions(s) / UC Diagnoses   Final diagnoses:  None   Discharge Instructions   None    ED Prescriptions   None    PDMP not reviewed this encounter.   Gilmer Etta PARAS, NP 04/28/24 1332
